# Patient Record
Sex: Male | Born: 1950
Health system: Southern US, Community
[De-identification: ages and names within clinical notes are randomized; demographics above are authoritative.]

## PROBLEM LIST (undated history)

## (undated) DIAGNOSIS — E119 Type 2 diabetes mellitus without complications: Secondary | ICD-10-CM

## (undated) DIAGNOSIS — E7800 Pure hypercholesterolemia, unspecified: Secondary | ICD-10-CM

## (undated) HISTORY — PX: CHOLECYSTECTOMY: SHX55

## (undated) HISTORY — PX: TONSILLECTOMY: SUR1361

## (undated) HISTORY — PX: EYE SURGERY: SHX253

---

## 1998-09-12 ENCOUNTER — Inpatient Hospital Stay (HOSPITAL_COMMUNITY): Admission: AD | Admit: 1998-09-12 | Discharge: 1998-09-13 | Payer: Self-pay | Admitting: Urology

## 1998-09-17 ENCOUNTER — Encounter (HOSPITAL_COMMUNITY): Admission: RE | Admit: 1998-09-17 | Discharge: 1998-10-15 | Payer: Self-pay | Admitting: Urology

## 1998-11-28 ENCOUNTER — Ambulatory Visit (HOSPITAL_COMMUNITY): Admission: RE | Admit: 1998-11-28 | Discharge: 1998-11-28 | Payer: Self-pay | Admitting: Family Medicine

## 2007-04-23 ENCOUNTER — Ambulatory Visit: Payer: Self-pay | Admitting: Family Medicine

## 2012-01-13 DIAGNOSIS — IMO0001 Reserved for inherently not codable concepts without codable children: Secondary | ICD-10-CM | POA: Diagnosis not present

## 2012-01-13 DIAGNOSIS — Z125 Encounter for screening for malignant neoplasm of prostate: Secondary | ICD-10-CM | POA: Diagnosis not present

## 2012-01-13 DIAGNOSIS — I10 Essential (primary) hypertension: Secondary | ICD-10-CM | POA: Diagnosis not present

## 2012-01-13 DIAGNOSIS — M25519 Pain in unspecified shoulder: Secondary | ICD-10-CM | POA: Diagnosis not present

## 2012-01-13 DIAGNOSIS — Z79899 Other long term (current) drug therapy: Secondary | ICD-10-CM | POA: Diagnosis not present

## 2012-01-13 DIAGNOSIS — E7800 Pure hypercholesterolemia, unspecified: Secondary | ICD-10-CM | POA: Diagnosis not present

## 2012-04-19 DIAGNOSIS — E782 Mixed hyperlipidemia: Secondary | ICD-10-CM | POA: Diagnosis not present

## 2012-04-19 DIAGNOSIS — M25519 Pain in unspecified shoulder: Secondary | ICD-10-CM | POA: Diagnosis not present

## 2012-04-19 DIAGNOSIS — E1149 Type 2 diabetes mellitus with other diabetic neurological complication: Secondary | ICD-10-CM | POA: Diagnosis not present

## 2012-04-19 DIAGNOSIS — I10 Essential (primary) hypertension: Secondary | ICD-10-CM | POA: Diagnosis not present

## 2012-07-21 DIAGNOSIS — I10 Essential (primary) hypertension: Secondary | ICD-10-CM | POA: Diagnosis not present

## 2012-07-21 DIAGNOSIS — F329 Major depressive disorder, single episode, unspecified: Secondary | ICD-10-CM | POA: Diagnosis not present

## 2012-07-21 DIAGNOSIS — E1149 Type 2 diabetes mellitus with other diabetic neurological complication: Secondary | ICD-10-CM | POA: Diagnosis not present

## 2012-07-21 DIAGNOSIS — E782 Mixed hyperlipidemia: Secondary | ICD-10-CM | POA: Diagnosis not present

## 2012-07-30 DIAGNOSIS — R7989 Other specified abnormal findings of blood chemistry: Secondary | ICD-10-CM | POA: Diagnosis not present

## 2012-07-30 DIAGNOSIS — K7689 Other specified diseases of liver: Secondary | ICD-10-CM | POA: Diagnosis not present

## 2012-10-21 DIAGNOSIS — E7800 Pure hypercholesterolemia, unspecified: Secondary | ICD-10-CM | POA: Diagnosis not present

## 2012-10-21 DIAGNOSIS — I10 Essential (primary) hypertension: Secondary | ICD-10-CM | POA: Diagnosis not present

## 2012-10-21 DIAGNOSIS — Z79899 Other long term (current) drug therapy: Secondary | ICD-10-CM | POA: Diagnosis not present

## 2012-10-21 DIAGNOSIS — E1149 Type 2 diabetes mellitus with other diabetic neurological complication: Secondary | ICD-10-CM | POA: Diagnosis not present

## 2013-01-21 DIAGNOSIS — E1149 Type 2 diabetes mellitus with other diabetic neurological complication: Secondary | ICD-10-CM | POA: Diagnosis not present

## 2013-01-21 DIAGNOSIS — Z125 Encounter for screening for malignant neoplasm of prostate: Secondary | ICD-10-CM | POA: Diagnosis not present

## 2013-01-21 DIAGNOSIS — M25519 Pain in unspecified shoulder: Secondary | ICD-10-CM | POA: Diagnosis not present

## 2013-01-21 DIAGNOSIS — I10 Essential (primary) hypertension: Secondary | ICD-10-CM | POA: Diagnosis not present

## 2013-01-21 DIAGNOSIS — E782 Mixed hyperlipidemia: Secondary | ICD-10-CM | POA: Diagnosis not present

## 2013-02-08 DIAGNOSIS — R972 Elevated prostate specific antigen [PSA]: Secondary | ICD-10-CM | POA: Diagnosis not present

## 2013-03-21 DIAGNOSIS — E1149 Type 2 diabetes mellitus with other diabetic neurological complication: Secondary | ICD-10-CM | POA: Diagnosis not present

## 2013-06-22 DIAGNOSIS — M25519 Pain in unspecified shoulder: Secondary | ICD-10-CM | POA: Diagnosis not present

## 2013-06-22 DIAGNOSIS — E7800 Pure hypercholesterolemia, unspecified: Secondary | ICD-10-CM | POA: Diagnosis not present

## 2013-06-22 DIAGNOSIS — I10 Essential (primary) hypertension: Secondary | ICD-10-CM | POA: Diagnosis not present

## 2013-06-22 DIAGNOSIS — E1149 Type 2 diabetes mellitus with other diabetic neurological complication: Secondary | ICD-10-CM | POA: Diagnosis not present

## 2013-07-29 DIAGNOSIS — E119 Type 2 diabetes mellitus without complications: Secondary | ICD-10-CM | POA: Diagnosis not present

## 2013-09-02 DIAGNOSIS — J019 Acute sinusitis, unspecified: Secondary | ICD-10-CM | POA: Diagnosis not present

## 2013-09-09 DIAGNOSIS — H11009 Unspecified pterygium of unspecified eye: Secondary | ICD-10-CM | POA: Diagnosis not present

## 2013-09-09 DIAGNOSIS — H2589 Other age-related cataract: Secondary | ICD-10-CM | POA: Diagnosis not present

## 2013-09-12 DIAGNOSIS — H11009 Unspecified pterygium of unspecified eye: Secondary | ICD-10-CM | POA: Diagnosis not present

## 2013-09-12 DIAGNOSIS — H11059 Peripheral pterygium, progressive, unspecified eye: Secondary | ICD-10-CM | POA: Diagnosis not present

## 2013-09-22 DIAGNOSIS — I10 Essential (primary) hypertension: Secondary | ICD-10-CM | POA: Diagnosis not present

## 2013-09-22 DIAGNOSIS — E1149 Type 2 diabetes mellitus with other diabetic neurological complication: Secondary | ICD-10-CM | POA: Diagnosis not present

## 2013-09-22 DIAGNOSIS — M25519 Pain in unspecified shoulder: Secondary | ICD-10-CM | POA: Diagnosis not present

## 2013-09-26 DIAGNOSIS — H11009 Unspecified pterygium of unspecified eye: Secondary | ICD-10-CM | POA: Diagnosis not present

## 2013-12-17 DIAGNOSIS — Z79899 Other long term (current) drug therapy: Secondary | ICD-10-CM | POA: Diagnosis not present

## 2013-12-17 DIAGNOSIS — I10 Essential (primary) hypertension: Secondary | ICD-10-CM | POA: Diagnosis not present

## 2013-12-17 DIAGNOSIS — L03818 Cellulitis of other sites: Secondary | ICD-10-CM | POA: Diagnosis not present

## 2013-12-17 DIAGNOSIS — E119 Type 2 diabetes mellitus without complications: Secondary | ICD-10-CM | POA: Diagnosis not present

## 2013-12-17 DIAGNOSIS — L02818 Cutaneous abscess of other sites: Secondary | ICD-10-CM | POA: Diagnosis not present

## 2013-12-17 DIAGNOSIS — E7800 Pure hypercholesterolemia, unspecified: Secondary | ICD-10-CM | POA: Diagnosis not present

## 2013-12-17 DIAGNOSIS — L0201 Cutaneous abscess of face: Secondary | ICD-10-CM | POA: Diagnosis not present

## 2013-12-29 DIAGNOSIS — L84 Corns and callosities: Secondary | ICD-10-CM | POA: Diagnosis not present

## 2013-12-29 DIAGNOSIS — F3289 Other specified depressive episodes: Secondary | ICD-10-CM | POA: Diagnosis not present

## 2013-12-29 DIAGNOSIS — M25519 Pain in unspecified shoulder: Secondary | ICD-10-CM | POA: Diagnosis not present

## 2013-12-29 DIAGNOSIS — E782 Mixed hyperlipidemia: Secondary | ICD-10-CM | POA: Diagnosis not present

## 2013-12-29 DIAGNOSIS — Z79899 Other long term (current) drug therapy: Secondary | ICD-10-CM | POA: Diagnosis not present

## 2013-12-29 DIAGNOSIS — I10 Essential (primary) hypertension: Secondary | ICD-10-CM | POA: Diagnosis not present

## 2013-12-29 DIAGNOSIS — F329 Major depressive disorder, single episode, unspecified: Secondary | ICD-10-CM | POA: Diagnosis not present

## 2013-12-29 DIAGNOSIS — E1149 Type 2 diabetes mellitus with other diabetic neurological complication: Secondary | ICD-10-CM | POA: Diagnosis not present

## 2014-01-03 DIAGNOSIS — H2589 Other age-related cataract: Secondary | ICD-10-CM | POA: Diagnosis not present

## 2014-01-24 DIAGNOSIS — Z794 Long term (current) use of insulin: Secondary | ICD-10-CM | POA: Diagnosis not present

## 2014-01-24 DIAGNOSIS — H2589 Other age-related cataract: Secondary | ICD-10-CM | POA: Diagnosis not present

## 2014-01-24 DIAGNOSIS — E119 Type 2 diabetes mellitus without complications: Secondary | ICD-10-CM | POA: Diagnosis not present

## 2014-01-24 DIAGNOSIS — Z79899 Other long term (current) drug therapy: Secondary | ICD-10-CM | POA: Diagnosis not present

## 2014-01-24 DIAGNOSIS — E785 Hyperlipidemia, unspecified: Secondary | ICD-10-CM | POA: Diagnosis not present

## 2014-01-24 DIAGNOSIS — I2510 Atherosclerotic heart disease of native coronary artery without angina pectoris: Secondary | ICD-10-CM | POA: Diagnosis not present

## 2014-01-24 DIAGNOSIS — I10 Essential (primary) hypertension: Secondary | ICD-10-CM | POA: Diagnosis not present

## 2014-01-24 DIAGNOSIS — H259 Unspecified age-related cataract: Secondary | ICD-10-CM | POA: Diagnosis not present

## 2014-02-27 DIAGNOSIS — E1149 Type 2 diabetes mellitus with other diabetic neurological complication: Secondary | ICD-10-CM | POA: Diagnosis not present

## 2014-02-27 DIAGNOSIS — G609 Hereditary and idiopathic neuropathy, unspecified: Secondary | ICD-10-CM | POA: Diagnosis not present

## 2014-02-27 DIAGNOSIS — R5383 Other fatigue: Secondary | ICD-10-CM | POA: Diagnosis not present

## 2014-02-27 DIAGNOSIS — R5381 Other malaise: Secondary | ICD-10-CM | POA: Diagnosis not present

## 2014-05-01 DIAGNOSIS — E1149 Type 2 diabetes mellitus with other diabetic neurological complication: Secondary | ICD-10-CM | POA: Diagnosis not present

## 2014-05-01 DIAGNOSIS — N289 Disorder of kidney and ureter, unspecified: Secondary | ICD-10-CM | POA: Diagnosis not present

## 2014-05-01 DIAGNOSIS — L84 Corns and callosities: Secondary | ICD-10-CM | POA: Diagnosis not present

## 2014-07-03 DIAGNOSIS — E1149 Type 2 diabetes mellitus with other diabetic neurological complication: Secondary | ICD-10-CM | POA: Diagnosis not present

## 2014-07-03 DIAGNOSIS — L02219 Cutaneous abscess of trunk, unspecified: Secondary | ICD-10-CM | POA: Diagnosis not present

## 2014-07-03 DIAGNOSIS — L03319 Cellulitis of trunk, unspecified: Secondary | ICD-10-CM | POA: Diagnosis not present

## 2014-07-03 DIAGNOSIS — M25519 Pain in unspecified shoulder: Secondary | ICD-10-CM | POA: Diagnosis not present

## 2014-07-16 DIAGNOSIS — L02419 Cutaneous abscess of limb, unspecified: Secondary | ICD-10-CM | POA: Diagnosis not present

## 2014-07-16 DIAGNOSIS — K648 Other hemorrhoids: Secondary | ICD-10-CM | POA: Diagnosis present

## 2014-07-16 DIAGNOSIS — A419 Sepsis, unspecified organism: Secondary | ICD-10-CM | POA: Diagnosis not present

## 2014-07-16 DIAGNOSIS — R0902 Hypoxemia: Secondary | ICD-10-CM | POA: Diagnosis present

## 2014-07-16 DIAGNOSIS — R109 Unspecified abdominal pain: Secondary | ICD-10-CM | POA: Diagnosis not present

## 2014-07-16 DIAGNOSIS — IMO0001 Reserved for inherently not codable concepts without codable children: Secondary | ICD-10-CM | POA: Diagnosis not present

## 2014-07-16 DIAGNOSIS — E1149 Type 2 diabetes mellitus with other diabetic neurological complication: Secondary | ICD-10-CM | POA: Diagnosis present

## 2014-07-16 DIAGNOSIS — N189 Chronic kidney disease, unspecified: Secondary | ICD-10-CM | POA: Diagnosis not present

## 2014-07-16 DIAGNOSIS — L03119 Cellulitis of unspecified part of limb: Secondary | ICD-10-CM | POA: Diagnosis not present

## 2014-07-16 DIAGNOSIS — N508 Other specified disorders of male genital organs: Secondary | ICD-10-CM | POA: Diagnosis not present

## 2014-07-16 DIAGNOSIS — L0291 Cutaneous abscess, unspecified: Secondary | ICD-10-CM | POA: Diagnosis not present

## 2014-07-16 DIAGNOSIS — Z794 Long term (current) use of insulin: Secondary | ICD-10-CM | POA: Diagnosis not present

## 2014-07-16 DIAGNOSIS — K5669 Other intestinal obstruction: Secondary | ICD-10-CM | POA: Diagnosis not present

## 2014-07-16 DIAGNOSIS — D473 Essential (hemorrhagic) thrombocythemia: Secondary | ICD-10-CM | POA: Diagnosis present

## 2014-07-16 DIAGNOSIS — R9431 Abnormal electrocardiogram [ECG] [EKG]: Secondary | ICD-10-CM | POA: Diagnosis not present

## 2014-07-16 DIAGNOSIS — R3915 Urgency of urination: Secondary | ICD-10-CM | POA: Diagnosis present

## 2014-07-16 DIAGNOSIS — L988 Other specified disorders of the skin and subcutaneous tissue: Secondary | ICD-10-CM | POA: Diagnosis not present

## 2014-07-16 DIAGNOSIS — E871 Hypo-osmolality and hyponatremia: Secondary | ICD-10-CM | POA: Diagnosis not present

## 2014-07-16 DIAGNOSIS — K645 Perianal venous thrombosis: Secondary | ICD-10-CM | POA: Diagnosis present

## 2014-07-16 DIAGNOSIS — N5089 Other specified disorders of the male genital organs: Secondary | ICD-10-CM | POA: Diagnosis present

## 2014-07-16 DIAGNOSIS — I2510 Atherosclerotic heart disease of native coronary artery without angina pectoris: Secondary | ICD-10-CM | POA: Diagnosis present

## 2014-07-16 DIAGNOSIS — I10 Essential (primary) hypertension: Secondary | ICD-10-CM | POA: Diagnosis not present

## 2014-07-16 DIAGNOSIS — K644 Residual hemorrhoidal skin tags: Secondary | ICD-10-CM | POA: Diagnosis present

## 2014-07-16 DIAGNOSIS — N401 Enlarged prostate with lower urinary tract symptoms: Secondary | ICD-10-CM | POA: Diagnosis present

## 2014-07-16 DIAGNOSIS — Z79899 Other long term (current) drug therapy: Secondary | ICD-10-CM | POA: Diagnosis not present

## 2014-07-16 DIAGNOSIS — D649 Anemia, unspecified: Secondary | ICD-10-CM | POA: Diagnosis not present

## 2014-07-16 DIAGNOSIS — E1142 Type 2 diabetes mellitus with diabetic polyneuropathy: Secondary | ICD-10-CM | POA: Diagnosis present

## 2014-07-16 DIAGNOSIS — B961 Klebsiella pneumoniae [K. pneumoniae] as the cause of diseases classified elsewhere: Secondary | ICD-10-CM | POA: Diagnosis present

## 2014-07-16 DIAGNOSIS — L02219 Cutaneous abscess of trunk, unspecified: Secondary | ICD-10-CM | POA: Diagnosis not present

## 2014-07-16 DIAGNOSIS — E119 Type 2 diabetes mellitus without complications: Secondary | ICD-10-CM | POA: Diagnosis not present

## 2014-07-16 DIAGNOSIS — E7800 Pure hypercholesterolemia, unspecified: Secondary | ICD-10-CM | POA: Diagnosis present

## 2014-07-16 DIAGNOSIS — I129 Hypertensive chronic kidney disease with stage 1 through stage 4 chronic kidney disease, or unspecified chronic kidney disease: Secondary | ICD-10-CM | POA: Diagnosis present

## 2014-07-16 DIAGNOSIS — R0609 Other forms of dyspnea: Secondary | ICD-10-CM | POA: Diagnosis not present

## 2014-07-16 DIAGNOSIS — E875 Hyperkalemia: Secondary | ICD-10-CM | POA: Diagnosis present

## 2014-07-16 DIAGNOSIS — R6510 Systemic inflammatory response syndrome (SIRS) of non-infectious origin without acute organ dysfunction: Secondary | ICD-10-CM | POA: Diagnosis not present

## 2014-07-21 DIAGNOSIS — L988 Other specified disorders of the skin and subcutaneous tissue: Secondary | ICD-10-CM | POA: Diagnosis not present

## 2014-07-21 DIAGNOSIS — L02219 Cutaneous abscess of trunk, unspecified: Secondary | ICD-10-CM | POA: Diagnosis not present

## 2014-07-21 DIAGNOSIS — N508 Other specified disorders of male genital organs: Secondary | ICD-10-CM | POA: Diagnosis not present

## 2014-07-21 DIAGNOSIS — L03319 Cellulitis of trunk, unspecified: Secondary | ICD-10-CM | POA: Diagnosis not present

## 2014-07-26 DIAGNOSIS — L03319 Cellulitis of trunk, unspecified: Secondary | ICD-10-CM | POA: Diagnosis not present

## 2014-07-26 DIAGNOSIS — E1165 Type 2 diabetes mellitus with hyperglycemia: Secondary | ICD-10-CM | POA: Diagnosis not present

## 2014-07-26 DIAGNOSIS — E780 Pure hypercholesterolemia: Secondary | ICD-10-CM | POA: Diagnosis not present

## 2014-07-26 DIAGNOSIS — I10 Essential (primary) hypertension: Secondary | ICD-10-CM | POA: Diagnosis not present

## 2014-07-26 DIAGNOSIS — Z794 Long term (current) use of insulin: Secondary | ICD-10-CM | POA: Diagnosis not present

## 2014-07-27 DIAGNOSIS — E1165 Type 2 diabetes mellitus with hyperglycemia: Secondary | ICD-10-CM | POA: Diagnosis not present

## 2014-07-27 DIAGNOSIS — Z794 Long term (current) use of insulin: Secondary | ICD-10-CM | POA: Diagnosis not present

## 2014-07-27 DIAGNOSIS — E780 Pure hypercholesterolemia: Secondary | ICD-10-CM | POA: Diagnosis not present

## 2014-07-27 DIAGNOSIS — I10 Essential (primary) hypertension: Secondary | ICD-10-CM | POA: Diagnosis not present

## 2014-07-27 DIAGNOSIS — L03319 Cellulitis of trunk, unspecified: Secondary | ICD-10-CM | POA: Diagnosis not present

## 2014-07-28 DIAGNOSIS — Z794 Long term (current) use of insulin: Secondary | ICD-10-CM | POA: Diagnosis not present

## 2014-07-28 DIAGNOSIS — E780 Pure hypercholesterolemia: Secondary | ICD-10-CM | POA: Diagnosis not present

## 2014-07-28 DIAGNOSIS — I10 Essential (primary) hypertension: Secondary | ICD-10-CM | POA: Diagnosis not present

## 2014-07-28 DIAGNOSIS — E1165 Type 2 diabetes mellitus with hyperglycemia: Secondary | ICD-10-CM | POA: Diagnosis not present

## 2014-07-28 DIAGNOSIS — L03319 Cellulitis of trunk, unspecified: Secondary | ICD-10-CM | POA: Diagnosis not present

## 2014-07-29 DIAGNOSIS — Z794 Long term (current) use of insulin: Secondary | ICD-10-CM | POA: Diagnosis not present

## 2014-07-29 DIAGNOSIS — E780 Pure hypercholesterolemia: Secondary | ICD-10-CM | POA: Diagnosis not present

## 2014-07-29 DIAGNOSIS — L03319 Cellulitis of trunk, unspecified: Secondary | ICD-10-CM | POA: Diagnosis not present

## 2014-07-29 DIAGNOSIS — I10 Essential (primary) hypertension: Secondary | ICD-10-CM | POA: Diagnosis not present

## 2014-07-29 DIAGNOSIS — E1165 Type 2 diabetes mellitus with hyperglycemia: Secondary | ICD-10-CM | POA: Diagnosis not present

## 2014-07-30 DIAGNOSIS — Z794 Long term (current) use of insulin: Secondary | ICD-10-CM | POA: Diagnosis not present

## 2014-07-30 DIAGNOSIS — L03319 Cellulitis of trunk, unspecified: Secondary | ICD-10-CM | POA: Diagnosis not present

## 2014-07-30 DIAGNOSIS — E780 Pure hypercholesterolemia: Secondary | ICD-10-CM | POA: Diagnosis not present

## 2014-07-30 DIAGNOSIS — E1165 Type 2 diabetes mellitus with hyperglycemia: Secondary | ICD-10-CM | POA: Diagnosis not present

## 2014-07-30 DIAGNOSIS — I10 Essential (primary) hypertension: Secondary | ICD-10-CM | POA: Diagnosis not present

## 2014-07-31 DIAGNOSIS — I10 Essential (primary) hypertension: Secondary | ICD-10-CM | POA: Diagnosis not present

## 2014-07-31 DIAGNOSIS — Z794 Long term (current) use of insulin: Secondary | ICD-10-CM | POA: Diagnosis not present

## 2014-07-31 DIAGNOSIS — E780 Pure hypercholesterolemia: Secondary | ICD-10-CM | POA: Diagnosis not present

## 2014-07-31 DIAGNOSIS — L03319 Cellulitis of trunk, unspecified: Secondary | ICD-10-CM | POA: Diagnosis not present

## 2014-07-31 DIAGNOSIS — E1165 Type 2 diabetes mellitus with hyperglycemia: Secondary | ICD-10-CM | POA: Diagnosis not present

## 2014-08-01 DIAGNOSIS — I10 Essential (primary) hypertension: Secondary | ICD-10-CM | POA: Diagnosis not present

## 2014-08-01 DIAGNOSIS — L03319 Cellulitis of trunk, unspecified: Secondary | ICD-10-CM | POA: Diagnosis not present

## 2014-08-01 DIAGNOSIS — E1165 Type 2 diabetes mellitus with hyperglycemia: Secondary | ICD-10-CM | POA: Diagnosis not present

## 2014-08-01 DIAGNOSIS — E780 Pure hypercholesterolemia: Secondary | ICD-10-CM | POA: Diagnosis not present

## 2014-08-01 DIAGNOSIS — Z794 Long term (current) use of insulin: Secondary | ICD-10-CM | POA: Diagnosis not present

## 2014-08-02 DIAGNOSIS — L0390 Cellulitis, unspecified: Secondary | ICD-10-CM | POA: Diagnosis not present

## 2014-08-02 DIAGNOSIS — E780 Pure hypercholesterolemia: Secondary | ICD-10-CM | POA: Diagnosis not present

## 2014-08-02 DIAGNOSIS — L0291 Cutaneous abscess, unspecified: Secondary | ICD-10-CM | POA: Diagnosis not present

## 2014-08-02 DIAGNOSIS — I10 Essential (primary) hypertension: Secondary | ICD-10-CM | POA: Diagnosis not present

## 2014-08-02 DIAGNOSIS — L03319 Cellulitis of trunk, unspecified: Secondary | ICD-10-CM | POA: Diagnosis not present

## 2014-08-02 DIAGNOSIS — Z794 Long term (current) use of insulin: Secondary | ICD-10-CM | POA: Diagnosis not present

## 2014-08-02 DIAGNOSIS — E1165 Type 2 diabetes mellitus with hyperglycemia: Secondary | ICD-10-CM | POA: Diagnosis not present

## 2014-08-03 DIAGNOSIS — E1165 Type 2 diabetes mellitus with hyperglycemia: Secondary | ICD-10-CM | POA: Diagnosis not present

## 2014-08-03 DIAGNOSIS — I10 Essential (primary) hypertension: Secondary | ICD-10-CM | POA: Diagnosis not present

## 2014-08-03 DIAGNOSIS — L03319 Cellulitis of trunk, unspecified: Secondary | ICD-10-CM | POA: Diagnosis not present

## 2014-08-03 DIAGNOSIS — E780 Pure hypercholesterolemia: Secondary | ICD-10-CM | POA: Diagnosis not present

## 2014-08-03 DIAGNOSIS — Z794 Long term (current) use of insulin: Secondary | ICD-10-CM | POA: Diagnosis not present

## 2014-08-04 DIAGNOSIS — Z794 Long term (current) use of insulin: Secondary | ICD-10-CM | POA: Diagnosis not present

## 2014-08-04 DIAGNOSIS — L03319 Cellulitis of trunk, unspecified: Secondary | ICD-10-CM | POA: Diagnosis not present

## 2014-08-04 DIAGNOSIS — E1165 Type 2 diabetes mellitus with hyperglycemia: Secondary | ICD-10-CM | POA: Diagnosis not present

## 2014-08-04 DIAGNOSIS — E780 Pure hypercholesterolemia: Secondary | ICD-10-CM | POA: Diagnosis not present

## 2014-08-04 DIAGNOSIS — I10 Essential (primary) hypertension: Secondary | ICD-10-CM | POA: Diagnosis not present

## 2014-08-05 DIAGNOSIS — Z794 Long term (current) use of insulin: Secondary | ICD-10-CM | POA: Diagnosis not present

## 2014-08-05 DIAGNOSIS — E780 Pure hypercholesterolemia: Secondary | ICD-10-CM | POA: Diagnosis not present

## 2014-08-05 DIAGNOSIS — I10 Essential (primary) hypertension: Secondary | ICD-10-CM | POA: Diagnosis not present

## 2014-08-05 DIAGNOSIS — E1165 Type 2 diabetes mellitus with hyperglycemia: Secondary | ICD-10-CM | POA: Diagnosis not present

## 2014-08-05 DIAGNOSIS — L03319 Cellulitis of trunk, unspecified: Secondary | ICD-10-CM | POA: Diagnosis not present

## 2014-08-06 DIAGNOSIS — E780 Pure hypercholesterolemia: Secondary | ICD-10-CM | POA: Diagnosis not present

## 2014-08-06 DIAGNOSIS — E1165 Type 2 diabetes mellitus with hyperglycemia: Secondary | ICD-10-CM | POA: Diagnosis not present

## 2014-08-06 DIAGNOSIS — Z794 Long term (current) use of insulin: Secondary | ICD-10-CM | POA: Diagnosis not present

## 2014-08-06 DIAGNOSIS — I10 Essential (primary) hypertension: Secondary | ICD-10-CM | POA: Diagnosis not present

## 2014-08-06 DIAGNOSIS — L03319 Cellulitis of trunk, unspecified: Secondary | ICD-10-CM | POA: Diagnosis not present

## 2014-08-07 DIAGNOSIS — L03319 Cellulitis of trunk, unspecified: Secondary | ICD-10-CM | POA: Diagnosis not present

## 2014-08-07 DIAGNOSIS — E780 Pure hypercholesterolemia: Secondary | ICD-10-CM | POA: Diagnosis not present

## 2014-08-07 DIAGNOSIS — E1165 Type 2 diabetes mellitus with hyperglycemia: Secondary | ICD-10-CM | POA: Diagnosis not present

## 2014-08-07 DIAGNOSIS — Z794 Long term (current) use of insulin: Secondary | ICD-10-CM | POA: Diagnosis not present

## 2014-08-07 DIAGNOSIS — I10 Essential (primary) hypertension: Secondary | ICD-10-CM | POA: Diagnosis not present

## 2014-08-08 DIAGNOSIS — I10 Essential (primary) hypertension: Secondary | ICD-10-CM | POA: Diagnosis not present

## 2014-08-08 DIAGNOSIS — E780 Pure hypercholesterolemia: Secondary | ICD-10-CM | POA: Diagnosis not present

## 2014-08-08 DIAGNOSIS — Z794 Long term (current) use of insulin: Secondary | ICD-10-CM | POA: Diagnosis not present

## 2014-08-08 DIAGNOSIS — E1165 Type 2 diabetes mellitus with hyperglycemia: Secondary | ICD-10-CM | POA: Diagnosis not present

## 2014-08-08 DIAGNOSIS — L03319 Cellulitis of trunk, unspecified: Secondary | ICD-10-CM | POA: Diagnosis not present

## 2014-08-09 DIAGNOSIS — Z794 Long term (current) use of insulin: Secondary | ICD-10-CM | POA: Diagnosis not present

## 2014-08-09 DIAGNOSIS — E1165 Type 2 diabetes mellitus with hyperglycemia: Secondary | ICD-10-CM | POA: Diagnosis not present

## 2014-08-09 DIAGNOSIS — E780 Pure hypercholesterolemia: Secondary | ICD-10-CM | POA: Diagnosis not present

## 2014-08-09 DIAGNOSIS — I10 Essential (primary) hypertension: Secondary | ICD-10-CM | POA: Diagnosis not present

## 2014-08-09 DIAGNOSIS — L03319 Cellulitis of trunk, unspecified: Secondary | ICD-10-CM | POA: Diagnosis not present

## 2014-08-10 DIAGNOSIS — E780 Pure hypercholesterolemia: Secondary | ICD-10-CM | POA: Diagnosis not present

## 2014-08-10 DIAGNOSIS — Z794 Long term (current) use of insulin: Secondary | ICD-10-CM | POA: Diagnosis not present

## 2014-08-10 DIAGNOSIS — L03319 Cellulitis of trunk, unspecified: Secondary | ICD-10-CM | POA: Diagnosis not present

## 2014-08-10 DIAGNOSIS — E1165 Type 2 diabetes mellitus with hyperglycemia: Secondary | ICD-10-CM | POA: Diagnosis not present

## 2014-08-10 DIAGNOSIS — I10 Essential (primary) hypertension: Secondary | ICD-10-CM | POA: Diagnosis not present

## 2014-08-11 DIAGNOSIS — E1165 Type 2 diabetes mellitus with hyperglycemia: Secondary | ICD-10-CM | POA: Diagnosis not present

## 2014-08-11 DIAGNOSIS — Z794 Long term (current) use of insulin: Secondary | ICD-10-CM | POA: Diagnosis not present

## 2014-08-11 DIAGNOSIS — L03319 Cellulitis of trunk, unspecified: Secondary | ICD-10-CM | POA: Diagnosis not present

## 2014-08-11 DIAGNOSIS — E780 Pure hypercholesterolemia: Secondary | ICD-10-CM | POA: Diagnosis not present

## 2014-08-11 DIAGNOSIS — I10 Essential (primary) hypertension: Secondary | ICD-10-CM | POA: Diagnosis not present

## 2014-08-12 DIAGNOSIS — Z794 Long term (current) use of insulin: Secondary | ICD-10-CM | POA: Diagnosis not present

## 2014-08-12 DIAGNOSIS — I10 Essential (primary) hypertension: Secondary | ICD-10-CM | POA: Diagnosis not present

## 2014-08-12 DIAGNOSIS — E780 Pure hypercholesterolemia: Secondary | ICD-10-CM | POA: Diagnosis not present

## 2014-08-12 DIAGNOSIS — E1165 Type 2 diabetes mellitus with hyperglycemia: Secondary | ICD-10-CM | POA: Diagnosis not present

## 2014-08-12 DIAGNOSIS — L03319 Cellulitis of trunk, unspecified: Secondary | ICD-10-CM | POA: Diagnosis not present

## 2014-08-13 DIAGNOSIS — Z794 Long term (current) use of insulin: Secondary | ICD-10-CM | POA: Diagnosis not present

## 2014-08-13 DIAGNOSIS — L03319 Cellulitis of trunk, unspecified: Secondary | ICD-10-CM | POA: Diagnosis not present

## 2014-08-13 DIAGNOSIS — E1165 Type 2 diabetes mellitus with hyperglycemia: Secondary | ICD-10-CM | POA: Diagnosis not present

## 2014-08-13 DIAGNOSIS — E780 Pure hypercholesterolemia: Secondary | ICD-10-CM | POA: Diagnosis not present

## 2014-08-13 DIAGNOSIS — I10 Essential (primary) hypertension: Secondary | ICD-10-CM | POA: Diagnosis not present

## 2014-08-14 DIAGNOSIS — Z794 Long term (current) use of insulin: Secondary | ICD-10-CM | POA: Diagnosis not present

## 2014-08-14 DIAGNOSIS — L03319 Cellulitis of trunk, unspecified: Secondary | ICD-10-CM | POA: Diagnosis not present

## 2014-08-14 DIAGNOSIS — E1165 Type 2 diabetes mellitus with hyperglycemia: Secondary | ICD-10-CM | POA: Diagnosis not present

## 2014-08-14 DIAGNOSIS — I10 Essential (primary) hypertension: Secondary | ICD-10-CM | POA: Diagnosis not present

## 2014-08-14 DIAGNOSIS — E780 Pure hypercholesterolemia: Secondary | ICD-10-CM | POA: Diagnosis not present

## 2014-08-15 DIAGNOSIS — L03319 Cellulitis of trunk, unspecified: Secondary | ICD-10-CM | POA: Diagnosis not present

## 2014-08-15 DIAGNOSIS — I10 Essential (primary) hypertension: Secondary | ICD-10-CM | POA: Diagnosis not present

## 2014-08-15 DIAGNOSIS — E780 Pure hypercholesterolemia: Secondary | ICD-10-CM | POA: Diagnosis not present

## 2014-08-15 DIAGNOSIS — E1165 Type 2 diabetes mellitus with hyperglycemia: Secondary | ICD-10-CM | POA: Diagnosis not present

## 2014-08-15 DIAGNOSIS — Z794 Long term (current) use of insulin: Secondary | ICD-10-CM | POA: Diagnosis not present

## 2014-08-16 DIAGNOSIS — L03319 Cellulitis of trunk, unspecified: Secondary | ICD-10-CM | POA: Diagnosis not present

## 2014-08-16 DIAGNOSIS — I10 Essential (primary) hypertension: Secondary | ICD-10-CM | POA: Diagnosis not present

## 2014-08-16 DIAGNOSIS — E1165 Type 2 diabetes mellitus with hyperglycemia: Secondary | ICD-10-CM | POA: Diagnosis not present

## 2014-08-16 DIAGNOSIS — E780 Pure hypercholesterolemia: Secondary | ICD-10-CM | POA: Diagnosis not present

## 2014-08-16 DIAGNOSIS — Z794 Long term (current) use of insulin: Secondary | ICD-10-CM | POA: Diagnosis not present

## 2014-08-17 DIAGNOSIS — L03319 Cellulitis of trunk, unspecified: Secondary | ICD-10-CM | POA: Diagnosis not present

## 2014-08-17 DIAGNOSIS — I10 Essential (primary) hypertension: Secondary | ICD-10-CM | POA: Diagnosis not present

## 2014-08-17 DIAGNOSIS — E780 Pure hypercholesterolemia: Secondary | ICD-10-CM | POA: Diagnosis not present

## 2014-08-17 DIAGNOSIS — Z794 Long term (current) use of insulin: Secondary | ICD-10-CM | POA: Diagnosis not present

## 2014-08-17 DIAGNOSIS — K8010 Calculus of gallbladder with chronic cholecystitis without obstruction: Secondary | ICD-10-CM | POA: Diagnosis not present

## 2014-08-17 DIAGNOSIS — E1165 Type 2 diabetes mellitus with hyperglycemia: Secondary | ICD-10-CM | POA: Diagnosis not present

## 2014-08-18 DIAGNOSIS — E780 Pure hypercholesterolemia: Secondary | ICD-10-CM | POA: Diagnosis not present

## 2014-08-18 DIAGNOSIS — L03319 Cellulitis of trunk, unspecified: Secondary | ICD-10-CM | POA: Diagnosis not present

## 2014-08-18 DIAGNOSIS — E1165 Type 2 diabetes mellitus with hyperglycemia: Secondary | ICD-10-CM | POA: Diagnosis not present

## 2014-08-18 DIAGNOSIS — I10 Essential (primary) hypertension: Secondary | ICD-10-CM | POA: Diagnosis not present

## 2014-08-18 DIAGNOSIS — Z794 Long term (current) use of insulin: Secondary | ICD-10-CM | POA: Diagnosis not present

## 2014-08-19 DIAGNOSIS — I10 Essential (primary) hypertension: Secondary | ICD-10-CM | POA: Diagnosis not present

## 2014-08-19 DIAGNOSIS — Z794 Long term (current) use of insulin: Secondary | ICD-10-CM | POA: Diagnosis not present

## 2014-08-19 DIAGNOSIS — E780 Pure hypercholesterolemia: Secondary | ICD-10-CM | POA: Diagnosis not present

## 2014-08-19 DIAGNOSIS — E1165 Type 2 diabetes mellitus with hyperglycemia: Secondary | ICD-10-CM | POA: Diagnosis not present

## 2014-08-19 DIAGNOSIS — L03319 Cellulitis of trunk, unspecified: Secondary | ICD-10-CM | POA: Diagnosis not present

## 2014-08-20 DIAGNOSIS — L03319 Cellulitis of trunk, unspecified: Secondary | ICD-10-CM | POA: Diagnosis not present

## 2014-08-20 DIAGNOSIS — Z794 Long term (current) use of insulin: Secondary | ICD-10-CM | POA: Diagnosis not present

## 2014-08-20 DIAGNOSIS — E780 Pure hypercholesterolemia: Secondary | ICD-10-CM | POA: Diagnosis not present

## 2014-08-20 DIAGNOSIS — E1165 Type 2 diabetes mellitus with hyperglycemia: Secondary | ICD-10-CM | POA: Diagnosis not present

## 2014-08-20 DIAGNOSIS — I10 Essential (primary) hypertension: Secondary | ICD-10-CM | POA: Diagnosis not present

## 2014-08-21 DIAGNOSIS — I10 Essential (primary) hypertension: Secondary | ICD-10-CM | POA: Diagnosis not present

## 2014-08-21 DIAGNOSIS — Z794 Long term (current) use of insulin: Secondary | ICD-10-CM | POA: Diagnosis not present

## 2014-08-21 DIAGNOSIS — E780 Pure hypercholesterolemia: Secondary | ICD-10-CM | POA: Diagnosis not present

## 2014-08-21 DIAGNOSIS — E1165 Type 2 diabetes mellitus with hyperglycemia: Secondary | ICD-10-CM | POA: Diagnosis not present

## 2014-08-21 DIAGNOSIS — L03319 Cellulitis of trunk, unspecified: Secondary | ICD-10-CM | POA: Diagnosis not present

## 2014-08-22 DIAGNOSIS — Z794 Long term (current) use of insulin: Secondary | ICD-10-CM | POA: Diagnosis not present

## 2014-08-22 DIAGNOSIS — L03319 Cellulitis of trunk, unspecified: Secondary | ICD-10-CM | POA: Diagnosis not present

## 2014-08-22 DIAGNOSIS — E1165 Type 2 diabetes mellitus with hyperglycemia: Secondary | ICD-10-CM | POA: Diagnosis not present

## 2014-08-22 DIAGNOSIS — I10 Essential (primary) hypertension: Secondary | ICD-10-CM | POA: Diagnosis not present

## 2014-08-22 DIAGNOSIS — E780 Pure hypercholesterolemia: Secondary | ICD-10-CM | POA: Diagnosis not present

## 2014-08-23 DIAGNOSIS — L0390 Cellulitis, unspecified: Secondary | ICD-10-CM | POA: Diagnosis not present

## 2014-08-23 DIAGNOSIS — E780 Pure hypercholesterolemia: Secondary | ICD-10-CM | POA: Diagnosis not present

## 2014-08-23 DIAGNOSIS — E1165 Type 2 diabetes mellitus with hyperglycemia: Secondary | ICD-10-CM | POA: Diagnosis not present

## 2014-08-23 DIAGNOSIS — L03319 Cellulitis of trunk, unspecified: Secondary | ICD-10-CM | POA: Diagnosis not present

## 2014-08-23 DIAGNOSIS — E119 Type 2 diabetes mellitus without complications: Secondary | ICD-10-CM | POA: Diagnosis not present

## 2014-08-23 DIAGNOSIS — I10 Essential (primary) hypertension: Secondary | ICD-10-CM | POA: Diagnosis not present

## 2014-08-23 DIAGNOSIS — L0291 Cutaneous abscess, unspecified: Secondary | ICD-10-CM | POA: Diagnosis not present

## 2014-08-23 DIAGNOSIS — Z794 Long term (current) use of insulin: Secondary | ICD-10-CM | POA: Diagnosis not present

## 2014-08-24 DIAGNOSIS — Z794 Long term (current) use of insulin: Secondary | ICD-10-CM | POA: Diagnosis not present

## 2014-08-24 DIAGNOSIS — L03319 Cellulitis of trunk, unspecified: Secondary | ICD-10-CM | POA: Diagnosis not present

## 2014-08-24 DIAGNOSIS — I10 Essential (primary) hypertension: Secondary | ICD-10-CM | POA: Diagnosis not present

## 2014-08-24 DIAGNOSIS — L0291 Cutaneous abscess, unspecified: Secondary | ICD-10-CM | POA: Diagnosis not present

## 2014-08-24 DIAGNOSIS — E1165 Type 2 diabetes mellitus with hyperglycemia: Secondary | ICD-10-CM | POA: Diagnosis not present

## 2014-08-24 DIAGNOSIS — E780 Pure hypercholesterolemia: Secondary | ICD-10-CM | POA: Diagnosis not present

## 2014-08-24 DIAGNOSIS — L0390 Cellulitis, unspecified: Secondary | ICD-10-CM | POA: Diagnosis not present

## 2014-08-25 DIAGNOSIS — E780 Pure hypercholesterolemia: Secondary | ICD-10-CM | POA: Diagnosis not present

## 2014-08-25 DIAGNOSIS — L03319 Cellulitis of trunk, unspecified: Secondary | ICD-10-CM | POA: Diagnosis not present

## 2014-08-25 DIAGNOSIS — E1165 Type 2 diabetes mellitus with hyperglycemia: Secondary | ICD-10-CM | POA: Diagnosis not present

## 2014-08-25 DIAGNOSIS — Z794 Long term (current) use of insulin: Secondary | ICD-10-CM | POA: Diagnosis not present

## 2014-08-25 DIAGNOSIS — I10 Essential (primary) hypertension: Secondary | ICD-10-CM | POA: Diagnosis not present

## 2014-08-26 DIAGNOSIS — E780 Pure hypercholesterolemia: Secondary | ICD-10-CM | POA: Diagnosis not present

## 2014-08-26 DIAGNOSIS — L03319 Cellulitis of trunk, unspecified: Secondary | ICD-10-CM | POA: Diagnosis not present

## 2014-08-26 DIAGNOSIS — Z794 Long term (current) use of insulin: Secondary | ICD-10-CM | POA: Diagnosis not present

## 2014-08-26 DIAGNOSIS — E1165 Type 2 diabetes mellitus with hyperglycemia: Secondary | ICD-10-CM | POA: Diagnosis not present

## 2014-08-26 DIAGNOSIS — I10 Essential (primary) hypertension: Secondary | ICD-10-CM | POA: Diagnosis not present

## 2014-08-27 DIAGNOSIS — E780 Pure hypercholesterolemia: Secondary | ICD-10-CM | POA: Diagnosis not present

## 2014-08-27 DIAGNOSIS — L03319 Cellulitis of trunk, unspecified: Secondary | ICD-10-CM | POA: Diagnosis not present

## 2014-08-27 DIAGNOSIS — I10 Essential (primary) hypertension: Secondary | ICD-10-CM | POA: Diagnosis not present

## 2014-08-27 DIAGNOSIS — Z794 Long term (current) use of insulin: Secondary | ICD-10-CM | POA: Diagnosis not present

## 2014-08-27 DIAGNOSIS — E1165 Type 2 diabetes mellitus with hyperglycemia: Secondary | ICD-10-CM | POA: Diagnosis not present

## 2014-08-28 DIAGNOSIS — L0390 Cellulitis, unspecified: Secondary | ICD-10-CM | POA: Diagnosis not present

## 2014-08-28 DIAGNOSIS — L03319 Cellulitis of trunk, unspecified: Secondary | ICD-10-CM | POA: Diagnosis not present

## 2014-08-28 DIAGNOSIS — E1165 Type 2 diabetes mellitus with hyperglycemia: Secondary | ICD-10-CM | POA: Diagnosis not present

## 2014-08-28 DIAGNOSIS — L0291 Cutaneous abscess, unspecified: Secondary | ICD-10-CM | POA: Diagnosis not present

## 2014-08-28 DIAGNOSIS — E780 Pure hypercholesterolemia: Secondary | ICD-10-CM | POA: Diagnosis not present

## 2014-08-28 DIAGNOSIS — Z794 Long term (current) use of insulin: Secondary | ICD-10-CM | POA: Diagnosis not present

## 2014-08-28 DIAGNOSIS — I10 Essential (primary) hypertension: Secondary | ICD-10-CM | POA: Diagnosis not present

## 2014-08-29 DIAGNOSIS — N400 Enlarged prostate without lower urinary tract symptoms: Secondary | ICD-10-CM | POA: Diagnosis not present

## 2014-08-29 DIAGNOSIS — I10 Essential (primary) hypertension: Secondary | ICD-10-CM | POA: Diagnosis not present

## 2014-08-29 DIAGNOSIS — E1149 Type 2 diabetes mellitus with other diabetic neurological complication: Secondary | ICD-10-CM | POA: Diagnosis not present

## 2014-08-29 DIAGNOSIS — E780 Pure hypercholesterolemia: Secondary | ICD-10-CM | POA: Diagnosis not present

## 2014-08-29 DIAGNOSIS — L02219 Cutaneous abscess of trunk, unspecified: Secondary | ICD-10-CM | POA: Diagnosis not present

## 2014-08-29 DIAGNOSIS — D649 Anemia, unspecified: Secondary | ICD-10-CM | POA: Diagnosis not present

## 2014-08-29 DIAGNOSIS — Z794 Long term (current) use of insulin: Secondary | ICD-10-CM | POA: Diagnosis not present

## 2014-08-29 DIAGNOSIS — L03319 Cellulitis of trunk, unspecified: Secondary | ICD-10-CM | POA: Diagnosis not present

## 2014-08-29 DIAGNOSIS — E1165 Type 2 diabetes mellitus with hyperglycemia: Secondary | ICD-10-CM | POA: Diagnosis not present

## 2014-08-29 DIAGNOSIS — N179 Acute kidney failure, unspecified: Secondary | ICD-10-CM | POA: Diagnosis not present

## 2014-08-30 DIAGNOSIS — L03319 Cellulitis of trunk, unspecified: Secondary | ICD-10-CM | POA: Diagnosis not present

## 2014-08-30 DIAGNOSIS — I10 Essential (primary) hypertension: Secondary | ICD-10-CM | POA: Diagnosis not present

## 2014-08-30 DIAGNOSIS — E1165 Type 2 diabetes mellitus with hyperglycemia: Secondary | ICD-10-CM | POA: Diagnosis not present

## 2014-08-30 DIAGNOSIS — Z794 Long term (current) use of insulin: Secondary | ICD-10-CM | POA: Diagnosis not present

## 2014-08-30 DIAGNOSIS — E780 Pure hypercholesterolemia: Secondary | ICD-10-CM | POA: Diagnosis not present

## 2014-08-31 DIAGNOSIS — L03319 Cellulitis of trunk, unspecified: Secondary | ICD-10-CM | POA: Diagnosis not present

## 2014-08-31 DIAGNOSIS — E780 Pure hypercholesterolemia: Secondary | ICD-10-CM | POA: Diagnosis not present

## 2014-08-31 DIAGNOSIS — Z794 Long term (current) use of insulin: Secondary | ICD-10-CM | POA: Diagnosis not present

## 2014-08-31 DIAGNOSIS — I10 Essential (primary) hypertension: Secondary | ICD-10-CM | POA: Diagnosis not present

## 2014-08-31 DIAGNOSIS — E1165 Type 2 diabetes mellitus with hyperglycemia: Secondary | ICD-10-CM | POA: Diagnosis not present

## 2014-09-01 DIAGNOSIS — Z794 Long term (current) use of insulin: Secondary | ICD-10-CM | POA: Diagnosis not present

## 2014-09-01 DIAGNOSIS — E1165 Type 2 diabetes mellitus with hyperglycemia: Secondary | ICD-10-CM | POA: Diagnosis not present

## 2014-09-01 DIAGNOSIS — I10 Essential (primary) hypertension: Secondary | ICD-10-CM | POA: Diagnosis not present

## 2014-09-01 DIAGNOSIS — L03319 Cellulitis of trunk, unspecified: Secondary | ICD-10-CM | POA: Diagnosis not present

## 2014-09-01 DIAGNOSIS — E780 Pure hypercholesterolemia: Secondary | ICD-10-CM | POA: Diagnosis not present

## 2014-09-02 DIAGNOSIS — E780 Pure hypercholesterolemia: Secondary | ICD-10-CM | POA: Diagnosis not present

## 2014-09-02 DIAGNOSIS — E1165 Type 2 diabetes mellitus with hyperglycemia: Secondary | ICD-10-CM | POA: Diagnosis not present

## 2014-09-02 DIAGNOSIS — Z794 Long term (current) use of insulin: Secondary | ICD-10-CM | POA: Diagnosis not present

## 2014-09-02 DIAGNOSIS — I10 Essential (primary) hypertension: Secondary | ICD-10-CM | POA: Diagnosis not present

## 2014-09-02 DIAGNOSIS — L03319 Cellulitis of trunk, unspecified: Secondary | ICD-10-CM | POA: Diagnosis not present

## 2014-09-03 DIAGNOSIS — E780 Pure hypercholesterolemia: Secondary | ICD-10-CM | POA: Diagnosis not present

## 2014-09-03 DIAGNOSIS — I10 Essential (primary) hypertension: Secondary | ICD-10-CM | POA: Diagnosis not present

## 2014-09-03 DIAGNOSIS — Z794 Long term (current) use of insulin: Secondary | ICD-10-CM | POA: Diagnosis not present

## 2014-09-03 DIAGNOSIS — L03319 Cellulitis of trunk, unspecified: Secondary | ICD-10-CM | POA: Diagnosis not present

## 2014-09-03 DIAGNOSIS — E1165 Type 2 diabetes mellitus with hyperglycemia: Secondary | ICD-10-CM | POA: Diagnosis not present

## 2014-09-04 DIAGNOSIS — Z794 Long term (current) use of insulin: Secondary | ICD-10-CM | POA: Diagnosis not present

## 2014-09-04 DIAGNOSIS — E1165 Type 2 diabetes mellitus with hyperglycemia: Secondary | ICD-10-CM | POA: Diagnosis not present

## 2014-09-04 DIAGNOSIS — L03319 Cellulitis of trunk, unspecified: Secondary | ICD-10-CM | POA: Diagnosis not present

## 2014-09-04 DIAGNOSIS — I10 Essential (primary) hypertension: Secondary | ICD-10-CM | POA: Diagnosis not present

## 2014-09-04 DIAGNOSIS — L0390 Cellulitis, unspecified: Secondary | ICD-10-CM | POA: Diagnosis not present

## 2014-09-04 DIAGNOSIS — L0291 Cutaneous abscess, unspecified: Secondary | ICD-10-CM | POA: Diagnosis not present

## 2014-09-04 DIAGNOSIS — E780 Pure hypercholesterolemia: Secondary | ICD-10-CM | POA: Diagnosis not present

## 2014-09-05 DIAGNOSIS — E1165 Type 2 diabetes mellitus with hyperglycemia: Secondary | ICD-10-CM | POA: Diagnosis not present

## 2014-09-05 DIAGNOSIS — E780 Pure hypercholesterolemia: Secondary | ICD-10-CM | POA: Diagnosis not present

## 2014-09-05 DIAGNOSIS — Z794 Long term (current) use of insulin: Secondary | ICD-10-CM | POA: Diagnosis not present

## 2014-09-05 DIAGNOSIS — L03319 Cellulitis of trunk, unspecified: Secondary | ICD-10-CM | POA: Diagnosis not present

## 2014-09-05 DIAGNOSIS — I10 Essential (primary) hypertension: Secondary | ICD-10-CM | POA: Diagnosis not present

## 2014-09-06 DIAGNOSIS — L03319 Cellulitis of trunk, unspecified: Secondary | ICD-10-CM | POA: Diagnosis not present

## 2014-09-06 DIAGNOSIS — E1165 Type 2 diabetes mellitus with hyperglycemia: Secondary | ICD-10-CM | POA: Diagnosis not present

## 2014-09-06 DIAGNOSIS — E780 Pure hypercholesterolemia: Secondary | ICD-10-CM | POA: Diagnosis not present

## 2014-09-06 DIAGNOSIS — Z794 Long term (current) use of insulin: Secondary | ICD-10-CM | POA: Diagnosis not present

## 2014-09-06 DIAGNOSIS — I10 Essential (primary) hypertension: Secondary | ICD-10-CM | POA: Diagnosis not present

## 2014-09-07 DIAGNOSIS — L0390 Cellulitis, unspecified: Secondary | ICD-10-CM | POA: Diagnosis not present

## 2014-09-07 DIAGNOSIS — E780 Pure hypercholesterolemia: Secondary | ICD-10-CM | POA: Diagnosis not present

## 2014-09-07 DIAGNOSIS — L03319 Cellulitis of trunk, unspecified: Secondary | ICD-10-CM | POA: Diagnosis not present

## 2014-09-07 DIAGNOSIS — Z794 Long term (current) use of insulin: Secondary | ICD-10-CM | POA: Diagnosis not present

## 2014-09-07 DIAGNOSIS — E1165 Type 2 diabetes mellitus with hyperglycemia: Secondary | ICD-10-CM | POA: Diagnosis not present

## 2014-09-07 DIAGNOSIS — I10 Essential (primary) hypertension: Secondary | ICD-10-CM | POA: Diagnosis not present

## 2014-09-07 DIAGNOSIS — L0291 Cutaneous abscess, unspecified: Secondary | ICD-10-CM | POA: Diagnosis not present

## 2014-09-08 DIAGNOSIS — Z794 Long term (current) use of insulin: Secondary | ICD-10-CM | POA: Diagnosis not present

## 2014-09-08 DIAGNOSIS — L03319 Cellulitis of trunk, unspecified: Secondary | ICD-10-CM | POA: Diagnosis not present

## 2014-09-08 DIAGNOSIS — I10 Essential (primary) hypertension: Secondary | ICD-10-CM | POA: Diagnosis not present

## 2014-09-08 DIAGNOSIS — E1165 Type 2 diabetes mellitus with hyperglycemia: Secondary | ICD-10-CM | POA: Diagnosis not present

## 2014-09-08 DIAGNOSIS — E780 Pure hypercholesterolemia: Secondary | ICD-10-CM | POA: Diagnosis not present

## 2014-09-09 DIAGNOSIS — E780 Pure hypercholesterolemia: Secondary | ICD-10-CM | POA: Diagnosis not present

## 2014-09-09 DIAGNOSIS — E1165 Type 2 diabetes mellitus with hyperglycemia: Secondary | ICD-10-CM | POA: Diagnosis not present

## 2014-09-09 DIAGNOSIS — Z794 Long term (current) use of insulin: Secondary | ICD-10-CM | POA: Diagnosis not present

## 2014-09-09 DIAGNOSIS — I10 Essential (primary) hypertension: Secondary | ICD-10-CM | POA: Diagnosis not present

## 2014-09-09 DIAGNOSIS — L03319 Cellulitis of trunk, unspecified: Secondary | ICD-10-CM | POA: Diagnosis not present

## 2014-09-10 DIAGNOSIS — I10 Essential (primary) hypertension: Secondary | ICD-10-CM | POA: Diagnosis not present

## 2014-09-10 DIAGNOSIS — E1165 Type 2 diabetes mellitus with hyperglycemia: Secondary | ICD-10-CM | POA: Diagnosis not present

## 2014-09-10 DIAGNOSIS — Z794 Long term (current) use of insulin: Secondary | ICD-10-CM | POA: Diagnosis not present

## 2014-09-10 DIAGNOSIS — L03319 Cellulitis of trunk, unspecified: Secondary | ICD-10-CM | POA: Diagnosis not present

## 2014-09-10 DIAGNOSIS — E780 Pure hypercholesterolemia: Secondary | ICD-10-CM | POA: Diagnosis not present

## 2014-09-11 DIAGNOSIS — I10 Essential (primary) hypertension: Secondary | ICD-10-CM | POA: Diagnosis not present

## 2014-09-11 DIAGNOSIS — E1165 Type 2 diabetes mellitus with hyperglycemia: Secondary | ICD-10-CM | POA: Diagnosis not present

## 2014-09-11 DIAGNOSIS — E780 Pure hypercholesterolemia: Secondary | ICD-10-CM | POA: Diagnosis not present

## 2014-09-11 DIAGNOSIS — L03319 Cellulitis of trunk, unspecified: Secondary | ICD-10-CM | POA: Diagnosis not present

## 2014-09-11 DIAGNOSIS — Z794 Long term (current) use of insulin: Secondary | ICD-10-CM | POA: Diagnosis not present

## 2014-09-12 DIAGNOSIS — L03319 Cellulitis of trunk, unspecified: Secondary | ICD-10-CM | POA: Diagnosis not present

## 2014-09-12 DIAGNOSIS — E1165 Type 2 diabetes mellitus with hyperglycemia: Secondary | ICD-10-CM | POA: Diagnosis not present

## 2014-09-12 DIAGNOSIS — Z794 Long term (current) use of insulin: Secondary | ICD-10-CM | POA: Diagnosis not present

## 2014-09-12 DIAGNOSIS — I10 Essential (primary) hypertension: Secondary | ICD-10-CM | POA: Diagnosis not present

## 2014-09-12 DIAGNOSIS — E780 Pure hypercholesterolemia: Secondary | ICD-10-CM | POA: Diagnosis not present

## 2014-09-13 DIAGNOSIS — L03319 Cellulitis of trunk, unspecified: Secondary | ICD-10-CM | POA: Diagnosis not present

## 2014-09-13 DIAGNOSIS — I10 Essential (primary) hypertension: Secondary | ICD-10-CM | POA: Diagnosis not present

## 2014-09-13 DIAGNOSIS — Z794 Long term (current) use of insulin: Secondary | ICD-10-CM | POA: Diagnosis not present

## 2014-09-13 DIAGNOSIS — E1165 Type 2 diabetes mellitus with hyperglycemia: Secondary | ICD-10-CM | POA: Diagnosis not present

## 2014-09-13 DIAGNOSIS — E780 Pure hypercholesterolemia: Secondary | ICD-10-CM | POA: Diagnosis not present

## 2014-09-14 DIAGNOSIS — Z794 Long term (current) use of insulin: Secondary | ICD-10-CM | POA: Diagnosis not present

## 2014-09-14 DIAGNOSIS — L03319 Cellulitis of trunk, unspecified: Secondary | ICD-10-CM | POA: Diagnosis not present

## 2014-09-14 DIAGNOSIS — E1165 Type 2 diabetes mellitus with hyperglycemia: Secondary | ICD-10-CM | POA: Diagnosis not present

## 2014-09-14 DIAGNOSIS — E780 Pure hypercholesterolemia: Secondary | ICD-10-CM | POA: Diagnosis not present

## 2014-09-14 DIAGNOSIS — I10 Essential (primary) hypertension: Secondary | ICD-10-CM | POA: Diagnosis not present

## 2014-09-15 DIAGNOSIS — E780 Pure hypercholesterolemia: Secondary | ICD-10-CM | POA: Diagnosis not present

## 2014-09-15 DIAGNOSIS — E1165 Type 2 diabetes mellitus with hyperglycemia: Secondary | ICD-10-CM | POA: Diagnosis not present

## 2014-09-15 DIAGNOSIS — Z794 Long term (current) use of insulin: Secondary | ICD-10-CM | POA: Diagnosis not present

## 2014-09-15 DIAGNOSIS — I10 Essential (primary) hypertension: Secondary | ICD-10-CM | POA: Diagnosis not present

## 2014-09-15 DIAGNOSIS — L03319 Cellulitis of trunk, unspecified: Secondary | ICD-10-CM | POA: Diagnosis not present

## 2014-09-16 DIAGNOSIS — I10 Essential (primary) hypertension: Secondary | ICD-10-CM | POA: Diagnosis not present

## 2014-09-16 DIAGNOSIS — Z794 Long term (current) use of insulin: Secondary | ICD-10-CM | POA: Diagnosis not present

## 2014-09-16 DIAGNOSIS — E780 Pure hypercholesterolemia: Secondary | ICD-10-CM | POA: Diagnosis not present

## 2014-09-16 DIAGNOSIS — E1165 Type 2 diabetes mellitus with hyperglycemia: Secondary | ICD-10-CM | POA: Diagnosis not present

## 2014-09-16 DIAGNOSIS — L03319 Cellulitis of trunk, unspecified: Secondary | ICD-10-CM | POA: Diagnosis not present

## 2014-09-17 DIAGNOSIS — I10 Essential (primary) hypertension: Secondary | ICD-10-CM | POA: Diagnosis not present

## 2014-09-17 DIAGNOSIS — L03319 Cellulitis of trunk, unspecified: Secondary | ICD-10-CM | POA: Diagnosis not present

## 2014-09-17 DIAGNOSIS — E1165 Type 2 diabetes mellitus with hyperglycemia: Secondary | ICD-10-CM | POA: Diagnosis not present

## 2014-09-17 DIAGNOSIS — Z794 Long term (current) use of insulin: Secondary | ICD-10-CM | POA: Diagnosis not present

## 2014-09-17 DIAGNOSIS — E780 Pure hypercholesterolemia: Secondary | ICD-10-CM | POA: Diagnosis not present

## 2014-09-18 DIAGNOSIS — E1165 Type 2 diabetes mellitus with hyperglycemia: Secondary | ICD-10-CM | POA: Diagnosis not present

## 2014-09-18 DIAGNOSIS — Z794 Long term (current) use of insulin: Secondary | ICD-10-CM | POA: Diagnosis not present

## 2014-09-18 DIAGNOSIS — I10 Essential (primary) hypertension: Secondary | ICD-10-CM | POA: Diagnosis not present

## 2014-09-18 DIAGNOSIS — E780 Pure hypercholesterolemia: Secondary | ICD-10-CM | POA: Diagnosis not present

## 2014-09-18 DIAGNOSIS — L03319 Cellulitis of trunk, unspecified: Secondary | ICD-10-CM | POA: Diagnosis not present

## 2014-09-19 DIAGNOSIS — I10 Essential (primary) hypertension: Secondary | ICD-10-CM | POA: Diagnosis not present

## 2014-09-19 DIAGNOSIS — L03319 Cellulitis of trunk, unspecified: Secondary | ICD-10-CM | POA: Diagnosis not present

## 2014-09-19 DIAGNOSIS — E1165 Type 2 diabetes mellitus with hyperglycemia: Secondary | ICD-10-CM | POA: Diagnosis not present

## 2014-09-19 DIAGNOSIS — E780 Pure hypercholesterolemia: Secondary | ICD-10-CM | POA: Diagnosis not present

## 2014-09-19 DIAGNOSIS — Z794 Long term (current) use of insulin: Secondary | ICD-10-CM | POA: Diagnosis not present

## 2014-09-20 DIAGNOSIS — E780 Pure hypercholesterolemia: Secondary | ICD-10-CM | POA: Diagnosis not present

## 2014-09-20 DIAGNOSIS — I10 Essential (primary) hypertension: Secondary | ICD-10-CM | POA: Diagnosis not present

## 2014-09-20 DIAGNOSIS — Z794 Long term (current) use of insulin: Secondary | ICD-10-CM | POA: Diagnosis not present

## 2014-09-20 DIAGNOSIS — E1165 Type 2 diabetes mellitus with hyperglycemia: Secondary | ICD-10-CM | POA: Diagnosis not present

## 2014-09-20 DIAGNOSIS — L03314 Cellulitis of groin: Secondary | ICD-10-CM | POA: Diagnosis not present

## 2014-09-20 DIAGNOSIS — L03319 Cellulitis of trunk, unspecified: Secondary | ICD-10-CM | POA: Diagnosis not present

## 2014-09-21 DIAGNOSIS — I10 Essential (primary) hypertension: Secondary | ICD-10-CM | POA: Diagnosis not present

## 2014-09-21 DIAGNOSIS — E780 Pure hypercholesterolemia: Secondary | ICD-10-CM | POA: Diagnosis not present

## 2014-09-21 DIAGNOSIS — Z794 Long term (current) use of insulin: Secondary | ICD-10-CM | POA: Diagnosis not present

## 2014-09-21 DIAGNOSIS — E1165 Type 2 diabetes mellitus with hyperglycemia: Secondary | ICD-10-CM | POA: Diagnosis not present

## 2014-09-21 DIAGNOSIS — L03319 Cellulitis of trunk, unspecified: Secondary | ICD-10-CM | POA: Diagnosis not present

## 2014-09-22 DIAGNOSIS — L03319 Cellulitis of trunk, unspecified: Secondary | ICD-10-CM | POA: Diagnosis not present

## 2014-09-22 DIAGNOSIS — E1165 Type 2 diabetes mellitus with hyperglycemia: Secondary | ICD-10-CM | POA: Diagnosis not present

## 2014-09-22 DIAGNOSIS — Z794 Long term (current) use of insulin: Secondary | ICD-10-CM | POA: Diagnosis not present

## 2014-09-22 DIAGNOSIS — E780 Pure hypercholesterolemia: Secondary | ICD-10-CM | POA: Diagnosis not present

## 2014-09-22 DIAGNOSIS — I10 Essential (primary) hypertension: Secondary | ICD-10-CM | POA: Diagnosis not present

## 2014-09-23 DIAGNOSIS — I10 Essential (primary) hypertension: Secondary | ICD-10-CM | POA: Diagnosis not present

## 2014-09-23 DIAGNOSIS — E780 Pure hypercholesterolemia: Secondary | ICD-10-CM | POA: Diagnosis not present

## 2014-09-23 DIAGNOSIS — E1165 Type 2 diabetes mellitus with hyperglycemia: Secondary | ICD-10-CM | POA: Diagnosis not present

## 2014-09-23 DIAGNOSIS — Z794 Long term (current) use of insulin: Secondary | ICD-10-CM | POA: Diagnosis not present

## 2014-09-23 DIAGNOSIS — L03319 Cellulitis of trunk, unspecified: Secondary | ICD-10-CM | POA: Diagnosis not present

## 2014-09-24 DIAGNOSIS — E780 Pure hypercholesterolemia: Secondary | ICD-10-CM | POA: Diagnosis not present

## 2014-09-24 DIAGNOSIS — Z794 Long term (current) use of insulin: Secondary | ICD-10-CM | POA: Diagnosis not present

## 2014-09-24 DIAGNOSIS — L03319 Cellulitis of trunk, unspecified: Secondary | ICD-10-CM | POA: Diagnosis not present

## 2014-09-24 DIAGNOSIS — E1165 Type 2 diabetes mellitus with hyperglycemia: Secondary | ICD-10-CM | POA: Diagnosis not present

## 2014-09-24 DIAGNOSIS — I10 Essential (primary) hypertension: Secondary | ICD-10-CM | POA: Diagnosis not present

## 2014-09-25 DIAGNOSIS — E1165 Type 2 diabetes mellitus with hyperglycemia: Secondary | ICD-10-CM | POA: Diagnosis not present

## 2014-09-25 DIAGNOSIS — Z794 Long term (current) use of insulin: Secondary | ICD-10-CM | POA: Diagnosis not present

## 2014-09-25 DIAGNOSIS — I10 Essential (primary) hypertension: Secondary | ICD-10-CM | POA: Diagnosis not present

## 2014-09-25 DIAGNOSIS — L03319 Cellulitis of trunk, unspecified: Secondary | ICD-10-CM | POA: Diagnosis not present

## 2014-09-25 DIAGNOSIS — E780 Pure hypercholesterolemia: Secondary | ICD-10-CM | POA: Diagnosis not present

## 2014-09-26 DIAGNOSIS — I10 Essential (primary) hypertension: Secondary | ICD-10-CM | POA: Diagnosis not present

## 2014-09-26 DIAGNOSIS — Z794 Long term (current) use of insulin: Secondary | ICD-10-CM | POA: Diagnosis not present

## 2014-09-26 DIAGNOSIS — L03319 Cellulitis of trunk, unspecified: Secondary | ICD-10-CM | POA: Diagnosis not present

## 2014-09-26 DIAGNOSIS — E780 Pure hypercholesterolemia: Secondary | ICD-10-CM | POA: Diagnosis not present

## 2014-09-26 DIAGNOSIS — E1165 Type 2 diabetes mellitus with hyperglycemia: Secondary | ICD-10-CM | POA: Diagnosis not present

## 2014-09-27 DIAGNOSIS — E1165 Type 2 diabetes mellitus with hyperglycemia: Secondary | ICD-10-CM | POA: Diagnosis not present

## 2014-09-27 DIAGNOSIS — Z794 Long term (current) use of insulin: Secondary | ICD-10-CM | POA: Diagnosis not present

## 2014-09-27 DIAGNOSIS — I10 Essential (primary) hypertension: Secondary | ICD-10-CM | POA: Diagnosis not present

## 2014-09-27 DIAGNOSIS — E780 Pure hypercholesterolemia: Secondary | ICD-10-CM | POA: Diagnosis not present

## 2014-09-27 DIAGNOSIS — L03319 Cellulitis of trunk, unspecified: Secondary | ICD-10-CM | POA: Diagnosis not present

## 2014-09-28 DIAGNOSIS — E1165 Type 2 diabetes mellitus with hyperglycemia: Secondary | ICD-10-CM | POA: Diagnosis not present

## 2014-09-28 DIAGNOSIS — L03319 Cellulitis of trunk, unspecified: Secondary | ICD-10-CM | POA: Diagnosis not present

## 2014-09-28 DIAGNOSIS — Z794 Long term (current) use of insulin: Secondary | ICD-10-CM | POA: Diagnosis not present

## 2014-09-28 DIAGNOSIS — I10 Essential (primary) hypertension: Secondary | ICD-10-CM | POA: Diagnosis not present

## 2014-09-28 DIAGNOSIS — E780 Pure hypercholesterolemia: Secondary | ICD-10-CM | POA: Diagnosis not present

## 2014-09-29 DIAGNOSIS — E1165 Type 2 diabetes mellitus with hyperglycemia: Secondary | ICD-10-CM | POA: Diagnosis not present

## 2014-09-29 DIAGNOSIS — Z794 Long term (current) use of insulin: Secondary | ICD-10-CM | POA: Diagnosis not present

## 2014-09-29 DIAGNOSIS — E780 Pure hypercholesterolemia: Secondary | ICD-10-CM | POA: Diagnosis not present

## 2014-09-29 DIAGNOSIS — I10 Essential (primary) hypertension: Secondary | ICD-10-CM | POA: Diagnosis not present

## 2014-09-29 DIAGNOSIS — L03319 Cellulitis of trunk, unspecified: Secondary | ICD-10-CM | POA: Diagnosis not present

## 2014-09-30 DIAGNOSIS — I10 Essential (primary) hypertension: Secondary | ICD-10-CM | POA: Diagnosis not present

## 2014-09-30 DIAGNOSIS — E1165 Type 2 diabetes mellitus with hyperglycemia: Secondary | ICD-10-CM | POA: Diagnosis not present

## 2014-09-30 DIAGNOSIS — Z794 Long term (current) use of insulin: Secondary | ICD-10-CM | POA: Diagnosis not present

## 2014-09-30 DIAGNOSIS — L03319 Cellulitis of trunk, unspecified: Secondary | ICD-10-CM | POA: Diagnosis not present

## 2014-09-30 DIAGNOSIS — E780 Pure hypercholesterolemia: Secondary | ICD-10-CM | POA: Diagnosis not present

## 2014-10-01 DIAGNOSIS — L03319 Cellulitis of trunk, unspecified: Secondary | ICD-10-CM | POA: Diagnosis not present

## 2014-10-01 DIAGNOSIS — I10 Essential (primary) hypertension: Secondary | ICD-10-CM | POA: Diagnosis not present

## 2014-10-01 DIAGNOSIS — E1165 Type 2 diabetes mellitus with hyperglycemia: Secondary | ICD-10-CM | POA: Diagnosis not present

## 2014-10-01 DIAGNOSIS — Z794 Long term (current) use of insulin: Secondary | ICD-10-CM | POA: Diagnosis not present

## 2014-10-01 DIAGNOSIS — E780 Pure hypercholesterolemia: Secondary | ICD-10-CM | POA: Diagnosis not present

## 2014-10-02 DIAGNOSIS — E1165 Type 2 diabetes mellitus with hyperglycemia: Secondary | ICD-10-CM | POA: Diagnosis not present

## 2014-10-02 DIAGNOSIS — L03319 Cellulitis of trunk, unspecified: Secondary | ICD-10-CM | POA: Diagnosis not present

## 2014-10-02 DIAGNOSIS — I10 Essential (primary) hypertension: Secondary | ICD-10-CM | POA: Diagnosis not present

## 2014-10-02 DIAGNOSIS — Z794 Long term (current) use of insulin: Secondary | ICD-10-CM | POA: Diagnosis not present

## 2014-10-02 DIAGNOSIS — E780 Pure hypercholesterolemia: Secondary | ICD-10-CM | POA: Diagnosis not present

## 2014-10-03 DIAGNOSIS — Z9181 History of falling: Secondary | ICD-10-CM | POA: Diagnosis not present

## 2014-10-03 DIAGNOSIS — L03319 Cellulitis of trunk, unspecified: Secondary | ICD-10-CM | POA: Diagnosis not present

## 2014-10-03 DIAGNOSIS — R42 Dizziness and giddiness: Secondary | ICD-10-CM | POA: Diagnosis not present

## 2014-10-03 DIAGNOSIS — N400 Enlarged prostate without lower urinary tract symptoms: Secondary | ICD-10-CM | POA: Diagnosis not present

## 2014-10-03 DIAGNOSIS — E780 Pure hypercholesterolemia: Secondary | ICD-10-CM | POA: Diagnosis not present

## 2014-10-03 DIAGNOSIS — E1149 Type 2 diabetes mellitus with other diabetic neurological complication: Secondary | ICD-10-CM | POA: Diagnosis not present

## 2014-10-03 DIAGNOSIS — I10 Essential (primary) hypertension: Secondary | ICD-10-CM | POA: Diagnosis not present

## 2014-10-03 DIAGNOSIS — E1165 Type 2 diabetes mellitus with hyperglycemia: Secondary | ICD-10-CM | POA: Diagnosis not present

## 2014-10-03 DIAGNOSIS — Z794 Long term (current) use of insulin: Secondary | ICD-10-CM | POA: Diagnosis not present

## 2014-10-04 DIAGNOSIS — L03319 Cellulitis of trunk, unspecified: Secondary | ICD-10-CM | POA: Diagnosis not present

## 2014-10-04 DIAGNOSIS — E780 Pure hypercholesterolemia: Secondary | ICD-10-CM | POA: Diagnosis not present

## 2014-10-04 DIAGNOSIS — Z794 Long term (current) use of insulin: Secondary | ICD-10-CM | POA: Diagnosis not present

## 2014-10-04 DIAGNOSIS — E1165 Type 2 diabetes mellitus with hyperglycemia: Secondary | ICD-10-CM | POA: Diagnosis not present

## 2014-10-04 DIAGNOSIS — I10 Essential (primary) hypertension: Secondary | ICD-10-CM | POA: Diagnosis not present

## 2014-10-05 DIAGNOSIS — E780 Pure hypercholesterolemia: Secondary | ICD-10-CM | POA: Diagnosis not present

## 2014-10-05 DIAGNOSIS — L03319 Cellulitis of trunk, unspecified: Secondary | ICD-10-CM | POA: Diagnosis not present

## 2014-10-05 DIAGNOSIS — I10 Essential (primary) hypertension: Secondary | ICD-10-CM | POA: Diagnosis not present

## 2014-10-05 DIAGNOSIS — Z794 Long term (current) use of insulin: Secondary | ICD-10-CM | POA: Diagnosis not present

## 2014-10-05 DIAGNOSIS — E1165 Type 2 diabetes mellitus with hyperglycemia: Secondary | ICD-10-CM | POA: Diagnosis not present

## 2014-10-06 DIAGNOSIS — L03319 Cellulitis of trunk, unspecified: Secondary | ICD-10-CM | POA: Diagnosis not present

## 2014-10-06 DIAGNOSIS — I10 Essential (primary) hypertension: Secondary | ICD-10-CM | POA: Diagnosis not present

## 2014-10-06 DIAGNOSIS — E1165 Type 2 diabetes mellitus with hyperglycemia: Secondary | ICD-10-CM | POA: Diagnosis not present

## 2014-10-06 DIAGNOSIS — E780 Pure hypercholesterolemia: Secondary | ICD-10-CM | POA: Diagnosis not present

## 2014-10-06 DIAGNOSIS — Z794 Long term (current) use of insulin: Secondary | ICD-10-CM | POA: Diagnosis not present

## 2014-10-09 DIAGNOSIS — E780 Pure hypercholesterolemia: Secondary | ICD-10-CM | POA: Diagnosis not present

## 2014-10-09 DIAGNOSIS — Z794 Long term (current) use of insulin: Secondary | ICD-10-CM | POA: Diagnosis not present

## 2014-10-09 DIAGNOSIS — I10 Essential (primary) hypertension: Secondary | ICD-10-CM | POA: Diagnosis not present

## 2014-10-09 DIAGNOSIS — L03319 Cellulitis of trunk, unspecified: Secondary | ICD-10-CM | POA: Diagnosis not present

## 2014-10-09 DIAGNOSIS — E1165 Type 2 diabetes mellitus with hyperglycemia: Secondary | ICD-10-CM | POA: Diagnosis not present

## 2014-10-13 DIAGNOSIS — E1165 Type 2 diabetes mellitus with hyperglycemia: Secondary | ICD-10-CM | POA: Diagnosis not present

## 2014-10-13 DIAGNOSIS — I10 Essential (primary) hypertension: Secondary | ICD-10-CM | POA: Diagnosis not present

## 2014-10-13 DIAGNOSIS — Z794 Long term (current) use of insulin: Secondary | ICD-10-CM | POA: Diagnosis not present

## 2014-10-13 DIAGNOSIS — E780 Pure hypercholesterolemia: Secondary | ICD-10-CM | POA: Diagnosis not present

## 2014-10-13 DIAGNOSIS — L03319 Cellulitis of trunk, unspecified: Secondary | ICD-10-CM | POA: Diagnosis not present

## 2014-10-16 DIAGNOSIS — E1165 Type 2 diabetes mellitus with hyperglycemia: Secondary | ICD-10-CM | POA: Diagnosis not present

## 2014-10-16 DIAGNOSIS — L03319 Cellulitis of trunk, unspecified: Secondary | ICD-10-CM | POA: Diagnosis not present

## 2014-10-16 DIAGNOSIS — E780 Pure hypercholesterolemia: Secondary | ICD-10-CM | POA: Diagnosis not present

## 2014-10-16 DIAGNOSIS — Z794 Long term (current) use of insulin: Secondary | ICD-10-CM | POA: Diagnosis not present

## 2014-10-16 DIAGNOSIS — I10 Essential (primary) hypertension: Secondary | ICD-10-CM | POA: Diagnosis not present

## 2014-10-17 DIAGNOSIS — N400 Enlarged prostate without lower urinary tract symptoms: Secondary | ICD-10-CM | POA: Diagnosis not present

## 2014-10-17 DIAGNOSIS — I10 Essential (primary) hypertension: Secondary | ICD-10-CM | POA: Diagnosis not present

## 2014-10-17 DIAGNOSIS — L02214 Cutaneous abscess of groin: Secondary | ICD-10-CM | POA: Diagnosis not present

## 2014-10-17 DIAGNOSIS — E1149 Type 2 diabetes mellitus with other diabetic neurological complication: Secondary | ICD-10-CM | POA: Diagnosis not present

## 2014-10-24 DIAGNOSIS — L0390 Cellulitis, unspecified: Secondary | ICD-10-CM | POA: Diagnosis not present

## 2014-10-24 DIAGNOSIS — N401 Enlarged prostate with lower urinary tract symptoms: Secondary | ICD-10-CM | POA: Diagnosis not present

## 2014-12-21 DIAGNOSIS — Z79899 Other long term (current) drug therapy: Secondary | ICD-10-CM | POA: Diagnosis not present

## 2014-12-21 DIAGNOSIS — M25512 Pain in left shoulder: Secondary | ICD-10-CM | POA: Diagnosis not present

## 2014-12-21 DIAGNOSIS — E782 Mixed hyperlipidemia: Secondary | ICD-10-CM | POA: Diagnosis not present

## 2014-12-21 DIAGNOSIS — I10 Essential (primary) hypertension: Secondary | ICD-10-CM | POA: Diagnosis not present

## 2014-12-21 DIAGNOSIS — E1149 Type 2 diabetes mellitus with other diabetic neurological complication: Secondary | ICD-10-CM | POA: Diagnosis not present

## 2014-12-21 DIAGNOSIS — E1140 Type 2 diabetes mellitus with diabetic neuropathy, unspecified: Secondary | ICD-10-CM | POA: Diagnosis not present

## 2014-12-21 DIAGNOSIS — F329 Major depressive disorder, single episode, unspecified: Secondary | ICD-10-CM | POA: Diagnosis not present

## 2014-12-21 DIAGNOSIS — Z1389 Encounter for screening for other disorder: Secondary | ICD-10-CM | POA: Diagnosis not present

## 2014-12-26 DIAGNOSIS — N529 Male erectile dysfunction, unspecified: Secondary | ICD-10-CM | POA: Diagnosis not present

## 2014-12-26 DIAGNOSIS — L0390 Cellulitis, unspecified: Secondary | ICD-10-CM | POA: Diagnosis not present

## 2014-12-26 DIAGNOSIS — N401 Enlarged prostate with lower urinary tract symptoms: Secondary | ICD-10-CM | POA: Diagnosis not present

## 2015-01-23 DIAGNOSIS — N401 Enlarged prostate with lower urinary tract symptoms: Secondary | ICD-10-CM | POA: Diagnosis not present

## 2015-01-23 DIAGNOSIS — N529 Male erectile dysfunction, unspecified: Secondary | ICD-10-CM | POA: Diagnosis not present

## 2015-01-23 DIAGNOSIS — N419 Inflammatory disease of prostate, unspecified: Secondary | ICD-10-CM | POA: Diagnosis not present

## 2015-01-26 DIAGNOSIS — N400 Enlarged prostate without lower urinary tract symptoms: Secondary | ICD-10-CM | POA: Diagnosis not present

## 2015-01-26 DIAGNOSIS — R479 Unspecified speech disturbances: Secondary | ICD-10-CM | POA: Diagnosis not present

## 2015-01-26 DIAGNOSIS — E1149 Type 2 diabetes mellitus with other diabetic neurological complication: Secondary | ICD-10-CM | POA: Diagnosis not present

## 2015-01-26 DIAGNOSIS — R42 Dizziness and giddiness: Secondary | ICD-10-CM | POA: Diagnosis not present

## 2015-03-22 DIAGNOSIS — M25512 Pain in left shoulder: Secondary | ICD-10-CM | POA: Diagnosis not present

## 2015-03-22 DIAGNOSIS — I10 Essential (primary) hypertension: Secondary | ICD-10-CM | POA: Diagnosis not present

## 2015-03-22 DIAGNOSIS — E1149 Type 2 diabetes mellitus with other diabetic neurological complication: Secondary | ICD-10-CM | POA: Diagnosis not present

## 2015-03-29 DIAGNOSIS — N401 Enlarged prostate with lower urinary tract symptoms: Secondary | ICD-10-CM | POA: Diagnosis not present

## 2015-03-29 DIAGNOSIS — N529 Male erectile dysfunction, unspecified: Secondary | ICD-10-CM | POA: Diagnosis not present

## 2015-03-29 DIAGNOSIS — L0390 Cellulitis, unspecified: Secondary | ICD-10-CM | POA: Diagnosis not present

## 2015-04-20 DIAGNOSIS — M25369 Other instability, unspecified knee: Secondary | ICD-10-CM | POA: Diagnosis not present

## 2015-04-20 DIAGNOSIS — M549 Dorsalgia, unspecified: Secondary | ICD-10-CM | POA: Diagnosis not present

## 2015-04-24 DIAGNOSIS — N529 Male erectile dysfunction, unspecified: Secondary | ICD-10-CM | POA: Diagnosis not present

## 2015-04-24 DIAGNOSIS — N401 Enlarged prostate with lower urinary tract symptoms: Secondary | ICD-10-CM | POA: Diagnosis not present

## 2015-05-16 DIAGNOSIS — H25812 Combined forms of age-related cataract, left eye: Secondary | ICD-10-CM | POA: Diagnosis not present

## 2015-05-16 DIAGNOSIS — E119 Type 2 diabetes mellitus without complications: Secondary | ICD-10-CM | POA: Diagnosis not present

## 2015-10-03 DIAGNOSIS — M25512 Pain in left shoulder: Secondary | ICD-10-CM | POA: Diagnosis not present

## 2015-10-03 DIAGNOSIS — Z125 Encounter for screening for malignant neoplasm of prostate: Secondary | ICD-10-CM | POA: Diagnosis not present

## 2015-10-03 DIAGNOSIS — E7800 Pure hypercholesterolemia, unspecified: Secondary | ICD-10-CM | POA: Diagnosis not present

## 2015-10-03 DIAGNOSIS — I10 Essential (primary) hypertension: Secondary | ICD-10-CM | POA: Diagnosis not present

## 2015-10-03 DIAGNOSIS — E1149 Type 2 diabetes mellitus with other diabetic neurological complication: Secondary | ICD-10-CM | POA: Diagnosis not present

## 2015-10-03 DIAGNOSIS — Z6830 Body mass index (BMI) 30.0-30.9, adult: Secondary | ICD-10-CM | POA: Diagnosis not present

## 2015-10-26 DIAGNOSIS — Z6829 Body mass index (BMI) 29.0-29.9, adult: Secondary | ICD-10-CM | POA: Diagnosis not present

## 2015-10-26 DIAGNOSIS — L03115 Cellulitis of right lower limb: Secondary | ICD-10-CM | POA: Diagnosis not present

## 2015-10-26 DIAGNOSIS — F329 Major depressive disorder, single episode, unspecified: Secondary | ICD-10-CM | POA: Diagnosis present

## 2015-10-26 DIAGNOSIS — I2510 Atherosclerotic heart disease of native coronary artery without angina pectoris: Secondary | ICD-10-CM | POA: Diagnosis not present

## 2015-10-26 DIAGNOSIS — E1151 Type 2 diabetes mellitus with diabetic peripheral angiopathy without gangrene: Secondary | ICD-10-CM | POA: Diagnosis not present

## 2015-10-26 DIAGNOSIS — I252 Old myocardial infarction: Secondary | ICD-10-CM | POA: Diagnosis not present

## 2015-10-26 DIAGNOSIS — R6510 Systemic inflammatory response syndrome (SIRS) of non-infectious origin without acute organ dysfunction: Secondary | ICD-10-CM | POA: Diagnosis not present

## 2015-10-26 DIAGNOSIS — Z794 Long term (current) use of insulin: Secondary | ICD-10-CM | POA: Diagnosis not present

## 2015-10-26 DIAGNOSIS — N189 Chronic kidney disease, unspecified: Secondary | ICD-10-CM | POA: Diagnosis not present

## 2015-10-26 DIAGNOSIS — I10 Essential (primary) hypertension: Secondary | ICD-10-CM | POA: Diagnosis not present

## 2015-10-26 DIAGNOSIS — Z79899 Other long term (current) drug therapy: Secondary | ICD-10-CM | POA: Diagnosis not present

## 2015-10-26 DIAGNOSIS — I70261 Atherosclerosis of native arteries of extremities with gangrene, right leg: Secondary | ICD-10-CM | POA: Diagnosis not present

## 2015-10-26 DIAGNOSIS — M869 Osteomyelitis, unspecified: Secondary | ICD-10-CM | POA: Diagnosis not present

## 2015-10-26 DIAGNOSIS — E1152 Type 2 diabetes mellitus with diabetic peripheral angiopathy with gangrene: Secondary | ICD-10-CM | POA: Diagnosis not present

## 2015-10-26 DIAGNOSIS — I96 Gangrene, not elsewhere classified: Secondary | ICD-10-CM | POA: Diagnosis not present

## 2015-10-26 DIAGNOSIS — E1169 Type 2 diabetes mellitus with other specified complication: Secondary | ICD-10-CM | POA: Diagnosis not present

## 2015-10-26 DIAGNOSIS — B951 Streptococcus, group B, as the cause of diseases classified elsewhere: Secondary | ICD-10-CM | POA: Diagnosis present

## 2015-10-26 DIAGNOSIS — R262 Difficulty in walking, not elsewhere classified: Secondary | ICD-10-CM | POA: Diagnosis not present

## 2015-10-26 DIAGNOSIS — D649 Anemia, unspecified: Secondary | ICD-10-CM | POA: Diagnosis not present

## 2015-10-26 DIAGNOSIS — R2681 Unsteadiness on feet: Secondary | ICD-10-CM | POA: Diagnosis not present

## 2015-10-26 DIAGNOSIS — L97519 Non-pressure chronic ulcer of other part of right foot with unspecified severity: Secondary | ICD-10-CM | POA: Diagnosis not present

## 2015-10-26 DIAGNOSIS — N181 Chronic kidney disease, stage 1: Secondary | ICD-10-CM | POA: Diagnosis present

## 2015-10-26 DIAGNOSIS — E11621 Type 2 diabetes mellitus with foot ulcer: Secondary | ICD-10-CM | POA: Diagnosis present

## 2015-10-26 DIAGNOSIS — Z89511 Acquired absence of right leg below knee: Secondary | ICD-10-CM | POA: Diagnosis not present

## 2015-10-26 DIAGNOSIS — R279 Unspecified lack of coordination: Secondary | ICD-10-CM | POA: Diagnosis not present

## 2015-10-26 DIAGNOSIS — L0390 Cellulitis, unspecified: Secondary | ICD-10-CM | POA: Diagnosis not present

## 2015-10-26 DIAGNOSIS — L97909 Non-pressure chronic ulcer of unspecified part of unspecified lower leg with unspecified severity: Secondary | ICD-10-CM | POA: Diagnosis not present

## 2015-10-26 DIAGNOSIS — L089 Local infection of the skin and subcutaneous tissue, unspecified: Secondary | ICD-10-CM | POA: Diagnosis not present

## 2015-10-26 DIAGNOSIS — M86671 Other chronic osteomyelitis, right ankle and foot: Secondary | ICD-10-CM | POA: Diagnosis not present

## 2015-10-26 DIAGNOSIS — Z452 Encounter for adjustment and management of vascular access device: Secondary | ICD-10-CM | POA: Diagnosis not present

## 2015-10-26 DIAGNOSIS — M6281 Muscle weakness (generalized): Secondary | ICD-10-CM | POA: Diagnosis not present

## 2015-10-26 DIAGNOSIS — E7800 Pure hypercholesterolemia, unspecified: Secondary | ICD-10-CM | POA: Diagnosis present

## 2015-10-26 DIAGNOSIS — M86272 Subacute osteomyelitis, left ankle and foot: Secondary | ICD-10-CM | POA: Diagnosis not present

## 2015-10-26 DIAGNOSIS — I129 Hypertensive chronic kidney disease with stage 1 through stage 4 chronic kidney disease, or unspecified chronic kidney disease: Secondary | ICD-10-CM | POA: Diagnosis present

## 2015-10-26 DIAGNOSIS — Z7401 Bed confinement status: Secondary | ICD-10-CM | POA: Diagnosis not present

## 2015-10-26 DIAGNOSIS — M86271 Subacute osteomyelitis, right ankle and foot: Secondary | ICD-10-CM | POA: Diagnosis not present

## 2015-10-26 DIAGNOSIS — Z4889 Encounter for other specified surgical aftercare: Secondary | ICD-10-CM | POA: Diagnosis not present

## 2015-10-26 DIAGNOSIS — L0291 Cutaneous abscess, unspecified: Secondary | ICD-10-CM | POA: Diagnosis not present

## 2015-10-26 DIAGNOSIS — I739 Peripheral vascular disease, unspecified: Secondary | ICD-10-CM | POA: Diagnosis not present

## 2015-10-26 DIAGNOSIS — L97529 Non-pressure chronic ulcer of other part of left foot with unspecified severity: Secondary | ICD-10-CM | POA: Diagnosis not present

## 2015-10-26 DIAGNOSIS — E1165 Type 2 diabetes mellitus with hyperglycemia: Secondary | ICD-10-CM | POA: Diagnosis not present

## 2015-10-26 DIAGNOSIS — E11628 Type 2 diabetes mellitus with other skin complications: Secondary | ICD-10-CM | POA: Diagnosis not present

## 2015-10-26 DIAGNOSIS — L97919 Non-pressure chronic ulcer of unspecified part of right lower leg with unspecified severity: Secondary | ICD-10-CM | POA: Diagnosis not present

## 2015-11-12 DIAGNOSIS — S88912A Complete traumatic amputation of left lower leg, level unspecified, initial encounter: Secondary | ICD-10-CM | POA: Diagnosis not present

## 2015-11-12 DIAGNOSIS — Z89511 Acquired absence of right leg below knee: Secondary | ICD-10-CM | POA: Diagnosis not present

## 2015-11-12 DIAGNOSIS — M8600 Acute hematogenous osteomyelitis, unspecified site: Secondary | ICD-10-CM | POA: Diagnosis not present

## 2015-11-12 DIAGNOSIS — E1165 Type 2 diabetes mellitus with hyperglycemia: Secondary | ICD-10-CM | POA: Diagnosis not present

## 2015-11-12 DIAGNOSIS — I129 Hypertensive chronic kidney disease with stage 1 through stage 4 chronic kidney disease, or unspecified chronic kidney disease: Secondary | ICD-10-CM | POA: Diagnosis not present

## 2015-11-12 DIAGNOSIS — R279 Unspecified lack of coordination: Secondary | ICD-10-CM | POA: Diagnosis not present

## 2015-11-12 DIAGNOSIS — R2681 Unsteadiness on feet: Secondary | ICD-10-CM | POA: Diagnosis not present

## 2015-11-12 DIAGNOSIS — I10 Essential (primary) hypertension: Secondary | ICD-10-CM | POA: Diagnosis not present

## 2015-11-12 DIAGNOSIS — L0390 Cellulitis, unspecified: Secondary | ICD-10-CM | POA: Diagnosis not present

## 2015-11-12 DIAGNOSIS — Z4889 Encounter for other specified surgical aftercare: Secondary | ICD-10-CM | POA: Diagnosis not present

## 2015-11-12 DIAGNOSIS — M869 Osteomyelitis, unspecified: Secondary | ICD-10-CM | POA: Diagnosis not present

## 2015-11-12 DIAGNOSIS — E119 Type 2 diabetes mellitus without complications: Secondary | ICD-10-CM | POA: Diagnosis not present

## 2015-11-12 DIAGNOSIS — M6281 Muscle weakness (generalized): Secondary | ICD-10-CM | POA: Diagnosis not present

## 2015-11-12 DIAGNOSIS — I2510 Atherosclerotic heart disease of native coronary artery without angina pectoris: Secondary | ICD-10-CM | POA: Diagnosis not present

## 2015-11-12 DIAGNOSIS — R262 Difficulty in walking, not elsewhere classified: Secondary | ICD-10-CM | POA: Diagnosis not present

## 2015-11-12 DIAGNOSIS — Z7401 Bed confinement status: Secondary | ICD-10-CM | POA: Diagnosis not present

## 2015-11-14 DIAGNOSIS — I10 Essential (primary) hypertension: Secondary | ICD-10-CM | POA: Diagnosis not present

## 2015-11-14 DIAGNOSIS — Z89511 Acquired absence of right leg below knee: Secondary | ICD-10-CM | POA: Diagnosis not present

## 2015-11-14 DIAGNOSIS — E119 Type 2 diabetes mellitus without complications: Secondary | ICD-10-CM | POA: Diagnosis not present

## 2015-11-28 DIAGNOSIS — M8600 Acute hematogenous osteomyelitis, unspecified site: Secondary | ICD-10-CM | POA: Diagnosis not present

## 2015-11-28 DIAGNOSIS — S88912A Complete traumatic amputation of left lower leg, level unspecified, initial encounter: Secondary | ICD-10-CM | POA: Diagnosis not present

## 2015-11-28 DIAGNOSIS — E119 Type 2 diabetes mellitus without complications: Secondary | ICD-10-CM | POA: Diagnosis not present

## 2015-12-04 DIAGNOSIS — Z79891 Long term (current) use of opiate analgesic: Secondary | ICD-10-CM | POA: Diagnosis not present

## 2015-12-04 DIAGNOSIS — S80911D Unspecified superficial injury of right knee, subsequent encounter: Secondary | ICD-10-CM | POA: Diagnosis not present

## 2015-12-04 DIAGNOSIS — N181 Chronic kidney disease, stage 1: Secondary | ICD-10-CM | POA: Diagnosis not present

## 2015-12-04 DIAGNOSIS — I129 Hypertensive chronic kidney disease with stage 1 through stage 4 chronic kidney disease, or unspecified chronic kidney disease: Secondary | ICD-10-CM | POA: Diagnosis not present

## 2015-12-04 DIAGNOSIS — Z89511 Acquired absence of right leg below knee: Secondary | ICD-10-CM | POA: Diagnosis not present

## 2015-12-04 DIAGNOSIS — E1151 Type 2 diabetes mellitus with diabetic peripheral angiopathy without gangrene: Secondary | ICD-10-CM | POA: Diagnosis not present

## 2015-12-04 DIAGNOSIS — E1165 Type 2 diabetes mellitus with hyperglycemia: Secondary | ICD-10-CM | POA: Diagnosis not present

## 2015-12-04 DIAGNOSIS — Z4781 Encounter for orthopedic aftercare following surgical amputation: Secondary | ICD-10-CM | POA: Diagnosis not present

## 2015-12-04 DIAGNOSIS — Z794 Long term (current) use of insulin: Secondary | ICD-10-CM | POA: Diagnosis not present

## 2015-12-04 DIAGNOSIS — E1122 Type 2 diabetes mellitus with diabetic chronic kidney disease: Secondary | ICD-10-CM | POA: Diagnosis not present

## 2015-12-04 DIAGNOSIS — I2510 Atherosclerotic heart disease of native coronary artery without angina pectoris: Secondary | ICD-10-CM | POA: Diagnosis not present

## 2015-12-04 DIAGNOSIS — F329 Major depressive disorder, single episode, unspecified: Secondary | ICD-10-CM | POA: Diagnosis not present

## 2015-12-05 DIAGNOSIS — Z4781 Encounter for orthopedic aftercare following surgical amputation: Secondary | ICD-10-CM | POA: Diagnosis not present

## 2015-12-05 DIAGNOSIS — E1165 Type 2 diabetes mellitus with hyperglycemia: Secondary | ICD-10-CM | POA: Diagnosis not present

## 2015-12-05 DIAGNOSIS — S80911D Unspecified superficial injury of right knee, subsequent encounter: Secondary | ICD-10-CM | POA: Diagnosis not present

## 2015-12-05 DIAGNOSIS — E1151 Type 2 diabetes mellitus with diabetic peripheral angiopathy without gangrene: Secondary | ICD-10-CM | POA: Diagnosis not present

## 2015-12-05 DIAGNOSIS — I2510 Atherosclerotic heart disease of native coronary artery without angina pectoris: Secondary | ICD-10-CM | POA: Diagnosis not present

## 2015-12-05 DIAGNOSIS — E1122 Type 2 diabetes mellitus with diabetic chronic kidney disease: Secondary | ICD-10-CM | POA: Diagnosis not present

## 2015-12-19 DIAGNOSIS — Z4781 Encounter for orthopedic aftercare following surgical amputation: Secondary | ICD-10-CM | POA: Diagnosis not present

## 2015-12-19 DIAGNOSIS — E1165 Type 2 diabetes mellitus with hyperglycemia: Secondary | ICD-10-CM | POA: Diagnosis not present

## 2015-12-19 DIAGNOSIS — E1151 Type 2 diabetes mellitus with diabetic peripheral angiopathy without gangrene: Secondary | ICD-10-CM | POA: Diagnosis not present

## 2015-12-19 DIAGNOSIS — E1122 Type 2 diabetes mellitus with diabetic chronic kidney disease: Secondary | ICD-10-CM | POA: Diagnosis not present

## 2015-12-19 DIAGNOSIS — S80911D Unspecified superficial injury of right knee, subsequent encounter: Secondary | ICD-10-CM | POA: Diagnosis not present

## 2015-12-19 DIAGNOSIS — I2510 Atherosclerotic heart disease of native coronary artery without angina pectoris: Secondary | ICD-10-CM | POA: Diagnosis not present

## 2015-12-20 DIAGNOSIS — E1122 Type 2 diabetes mellitus with diabetic chronic kidney disease: Secondary | ICD-10-CM | POA: Diagnosis not present

## 2015-12-20 DIAGNOSIS — I2510 Atherosclerotic heart disease of native coronary artery without angina pectoris: Secondary | ICD-10-CM | POA: Diagnosis not present

## 2015-12-20 DIAGNOSIS — S80911D Unspecified superficial injury of right knee, subsequent encounter: Secondary | ICD-10-CM | POA: Diagnosis not present

## 2015-12-20 DIAGNOSIS — E1151 Type 2 diabetes mellitus with diabetic peripheral angiopathy without gangrene: Secondary | ICD-10-CM | POA: Diagnosis not present

## 2015-12-20 DIAGNOSIS — Z4781 Encounter for orthopedic aftercare following surgical amputation: Secondary | ICD-10-CM | POA: Diagnosis not present

## 2015-12-20 DIAGNOSIS — E1165 Type 2 diabetes mellitus with hyperglycemia: Secondary | ICD-10-CM | POA: Diagnosis not present

## 2015-12-21 DIAGNOSIS — E1165 Type 2 diabetes mellitus with hyperglycemia: Secondary | ICD-10-CM | POA: Diagnosis not present

## 2015-12-21 DIAGNOSIS — E1122 Type 2 diabetes mellitus with diabetic chronic kidney disease: Secondary | ICD-10-CM | POA: Diagnosis not present

## 2015-12-21 DIAGNOSIS — E1151 Type 2 diabetes mellitus with diabetic peripheral angiopathy without gangrene: Secondary | ICD-10-CM | POA: Diagnosis not present

## 2015-12-21 DIAGNOSIS — Z4781 Encounter for orthopedic aftercare following surgical amputation: Secondary | ICD-10-CM | POA: Diagnosis not present

## 2015-12-21 DIAGNOSIS — S80911D Unspecified superficial injury of right knee, subsequent encounter: Secondary | ICD-10-CM | POA: Diagnosis not present

## 2015-12-21 DIAGNOSIS — I2510 Atherosclerotic heart disease of native coronary artery without angina pectoris: Secondary | ICD-10-CM | POA: Diagnosis not present

## 2015-12-25 DIAGNOSIS — E1165 Type 2 diabetes mellitus with hyperglycemia: Secondary | ICD-10-CM | POA: Diagnosis not present

## 2015-12-25 DIAGNOSIS — Z4781 Encounter for orthopedic aftercare following surgical amputation: Secondary | ICD-10-CM | POA: Diagnosis not present

## 2015-12-25 DIAGNOSIS — E1151 Type 2 diabetes mellitus with diabetic peripheral angiopathy without gangrene: Secondary | ICD-10-CM | POA: Diagnosis not present

## 2015-12-25 DIAGNOSIS — I2510 Atherosclerotic heart disease of native coronary artery without angina pectoris: Secondary | ICD-10-CM | POA: Diagnosis not present

## 2015-12-25 DIAGNOSIS — E1122 Type 2 diabetes mellitus with diabetic chronic kidney disease: Secondary | ICD-10-CM | POA: Diagnosis not present

## 2015-12-25 DIAGNOSIS — S80911D Unspecified superficial injury of right knee, subsequent encounter: Secondary | ICD-10-CM | POA: Diagnosis not present

## 2016-01-04 DIAGNOSIS — I10 Essential (primary) hypertension: Secondary | ICD-10-CM | POA: Diagnosis not present

## 2016-01-04 DIAGNOSIS — M25512 Pain in left shoulder: Secondary | ICD-10-CM | POA: Diagnosis not present

## 2016-01-04 DIAGNOSIS — E7800 Pure hypercholesterolemia, unspecified: Secondary | ICD-10-CM | POA: Diagnosis not present

## 2016-01-04 DIAGNOSIS — Z89511 Acquired absence of right leg below knee: Secondary | ICD-10-CM | POA: Diagnosis not present

## 2016-01-04 DIAGNOSIS — M79604 Pain in right leg: Secondary | ICD-10-CM | POA: Diagnosis not present

## 2016-01-04 DIAGNOSIS — E1149 Type 2 diabetes mellitus with other diabetic neurological complication: Secondary | ICD-10-CM | POA: Diagnosis not present

## 2016-02-21 DIAGNOSIS — M79661 Pain in right lower leg: Secondary | ICD-10-CM | POA: Diagnosis not present

## 2016-03-06 DIAGNOSIS — E161 Other hypoglycemia: Secondary | ICD-10-CM | POA: Diagnosis not present

## 2016-03-07 DIAGNOSIS — E161 Other hypoglycemia: Secondary | ICD-10-CM | POA: Diagnosis not present

## 2016-03-07 DIAGNOSIS — E11649 Type 2 diabetes mellitus with hypoglycemia without coma: Secondary | ICD-10-CM | POA: Diagnosis not present

## 2016-04-03 DIAGNOSIS — F329 Major depressive disorder, single episode, unspecified: Secondary | ICD-10-CM | POA: Diagnosis not present

## 2016-04-03 DIAGNOSIS — E663 Overweight: Secondary | ICD-10-CM | POA: Diagnosis not present

## 2016-04-03 DIAGNOSIS — M79604 Pain in right leg: Secondary | ICD-10-CM | POA: Diagnosis not present

## 2016-04-03 DIAGNOSIS — Z79899 Other long term (current) drug therapy: Secondary | ICD-10-CM | POA: Diagnosis not present

## 2016-04-03 DIAGNOSIS — E1149 Type 2 diabetes mellitus with other diabetic neurological complication: Secondary | ICD-10-CM | POA: Diagnosis not present

## 2016-04-03 DIAGNOSIS — I10 Essential (primary) hypertension: Secondary | ICD-10-CM | POA: Diagnosis not present

## 2016-04-03 DIAGNOSIS — Z6826 Body mass index (BMI) 26.0-26.9, adult: Secondary | ICD-10-CM | POA: Diagnosis not present

## 2016-04-03 DIAGNOSIS — M25512 Pain in left shoulder: Secondary | ICD-10-CM | POA: Diagnosis not present

## 2016-04-03 DIAGNOSIS — Z1389 Encounter for screening for other disorder: Secondary | ICD-10-CM | POA: Diagnosis not present

## 2016-04-14 DIAGNOSIS — Z89511 Acquired absence of right leg below knee: Secondary | ICD-10-CM | POA: Diagnosis not present

## 2016-04-14 DIAGNOSIS — R2689 Other abnormalities of gait and mobility: Secondary | ICD-10-CM | POA: Diagnosis not present

## 2016-04-14 DIAGNOSIS — M6281 Muscle weakness (generalized): Secondary | ICD-10-CM | POA: Diagnosis not present

## 2016-07-01 DIAGNOSIS — Z89511 Acquired absence of right leg below knee: Secondary | ICD-10-CM | POA: Diagnosis not present

## 2016-07-01 DIAGNOSIS — M869 Osteomyelitis, unspecified: Secondary | ICD-10-CM | POA: Diagnosis not present

## 2016-07-01 DIAGNOSIS — M6281 Muscle weakness (generalized): Secondary | ICD-10-CM | POA: Diagnosis not present

## 2016-07-01 DIAGNOSIS — R262 Difficulty in walking, not elsewhere classified: Secondary | ICD-10-CM | POA: Diagnosis not present

## 2016-07-01 DIAGNOSIS — I2510 Atherosclerotic heart disease of native coronary artery without angina pectoris: Secondary | ICD-10-CM | POA: Diagnosis not present

## 2016-07-01 DIAGNOSIS — N181 Chronic kidney disease, stage 1: Secondary | ICD-10-CM | POA: Diagnosis not present

## 2016-07-08 DIAGNOSIS — E161 Other hypoglycemia: Secondary | ICD-10-CM | POA: Diagnosis not present

## 2016-07-08 DIAGNOSIS — E162 Hypoglycemia, unspecified: Secondary | ICD-10-CM | POA: Diagnosis not present

## 2016-07-10 DIAGNOSIS — Z89511 Acquired absence of right leg below knee: Secondary | ICD-10-CM | POA: Diagnosis not present

## 2016-07-10 DIAGNOSIS — M25512 Pain in left shoulder: Secondary | ICD-10-CM | POA: Diagnosis not present

## 2016-07-10 DIAGNOSIS — E7800 Pure hypercholesterolemia, unspecified: Secondary | ICD-10-CM | POA: Diagnosis not present

## 2016-07-10 DIAGNOSIS — M79604 Pain in right leg: Secondary | ICD-10-CM | POA: Diagnosis not present

## 2016-07-10 DIAGNOSIS — I10 Essential (primary) hypertension: Secondary | ICD-10-CM | POA: Diagnosis not present

## 2016-07-10 DIAGNOSIS — E1149 Type 2 diabetes mellitus with other diabetic neurological complication: Secondary | ICD-10-CM | POA: Diagnosis not present

## 2016-08-01 DIAGNOSIS — M869 Osteomyelitis, unspecified: Secondary | ICD-10-CM | POA: Diagnosis not present

## 2016-08-01 DIAGNOSIS — M6281 Muscle weakness (generalized): Secondary | ICD-10-CM | POA: Diagnosis not present

## 2016-08-01 DIAGNOSIS — Z89511 Acquired absence of right leg below knee: Secondary | ICD-10-CM | POA: Diagnosis not present

## 2016-08-01 DIAGNOSIS — R262 Difficulty in walking, not elsewhere classified: Secondary | ICD-10-CM | POA: Diagnosis not present

## 2016-08-01 DIAGNOSIS — N181 Chronic kidney disease, stage 1: Secondary | ICD-10-CM | POA: Diagnosis not present

## 2016-08-01 DIAGNOSIS — I2510 Atherosclerotic heart disease of native coronary artery without angina pectoris: Secondary | ICD-10-CM | POA: Diagnosis not present

## 2016-09-22 DIAGNOSIS — Z6830 Body mass index (BMI) 30.0-30.9, adult: Secondary | ICD-10-CM | POA: Diagnosis not present

## 2016-09-22 DIAGNOSIS — E663 Overweight: Secondary | ICD-10-CM | POA: Diagnosis not present

## 2016-09-22 DIAGNOSIS — L02415 Cutaneous abscess of right lower limb: Secondary | ICD-10-CM | POA: Diagnosis not present

## 2016-09-23 DIAGNOSIS — L02415 Cutaneous abscess of right lower limb: Secondary | ICD-10-CM | POA: Diagnosis not present

## 2016-10-09 DIAGNOSIS — I10 Essential (primary) hypertension: Secondary | ICD-10-CM | POA: Diagnosis not present

## 2016-10-09 DIAGNOSIS — E1149 Type 2 diabetes mellitus with other diabetic neurological complication: Secondary | ICD-10-CM | POA: Diagnosis not present

## 2016-10-09 DIAGNOSIS — Z89511 Acquired absence of right leg below knee: Secondary | ICD-10-CM | POA: Diagnosis not present

## 2016-10-09 DIAGNOSIS — L02415 Cutaneous abscess of right lower limb: Secondary | ICD-10-CM | POA: Diagnosis not present

## 2016-10-09 DIAGNOSIS — Z79899 Other long term (current) drug therapy: Secondary | ICD-10-CM | POA: Diagnosis not present

## 2016-10-23 DIAGNOSIS — Z89511 Acquired absence of right leg below knee: Secondary | ICD-10-CM | POA: Diagnosis not present

## 2016-10-23 DIAGNOSIS — E1149 Type 2 diabetes mellitus with other diabetic neurological complication: Secondary | ICD-10-CM | POA: Diagnosis not present

## 2016-10-23 DIAGNOSIS — L02415 Cutaneous abscess of right lower limb: Secondary | ICD-10-CM | POA: Diagnosis not present

## 2016-11-13 DIAGNOSIS — L02415 Cutaneous abscess of right lower limb: Secondary | ICD-10-CM | POA: Diagnosis not present

## 2016-11-13 DIAGNOSIS — M25512 Pain in left shoulder: Secondary | ICD-10-CM | POA: Diagnosis not present

## 2016-11-13 DIAGNOSIS — Z6830 Body mass index (BMI) 30.0-30.9, adult: Secondary | ICD-10-CM | POA: Diagnosis not present

## 2017-01-13 DIAGNOSIS — M25512 Pain in left shoulder: Secondary | ICD-10-CM | POA: Diagnosis not present

## 2017-01-13 DIAGNOSIS — Z89511 Acquired absence of right leg below knee: Secondary | ICD-10-CM | POA: Diagnosis not present

## 2017-01-13 DIAGNOSIS — Z125 Encounter for screening for malignant neoplasm of prostate: Secondary | ICD-10-CM | POA: Diagnosis not present

## 2017-01-13 DIAGNOSIS — G546 Phantom limb syndrome with pain: Secondary | ICD-10-CM | POA: Diagnosis not present

## 2017-01-13 DIAGNOSIS — E782 Mixed hyperlipidemia: Secondary | ICD-10-CM | POA: Diagnosis not present

## 2017-01-13 DIAGNOSIS — E1149 Type 2 diabetes mellitus with other diabetic neurological complication: Secondary | ICD-10-CM | POA: Diagnosis not present

## 2017-01-13 DIAGNOSIS — I10 Essential (primary) hypertension: Secondary | ICD-10-CM | POA: Diagnosis not present

## 2017-04-13 DIAGNOSIS — M25512 Pain in left shoulder: Secondary | ICD-10-CM | POA: Diagnosis not present

## 2017-04-13 DIAGNOSIS — Z1389 Encounter for screening for other disorder: Secondary | ICD-10-CM | POA: Diagnosis not present

## 2017-04-13 DIAGNOSIS — E1142 Type 2 diabetes mellitus with diabetic polyneuropathy: Secondary | ICD-10-CM | POA: Diagnosis not present

## 2017-04-13 DIAGNOSIS — Z6830 Body mass index (BMI) 30.0-30.9, adult: Secondary | ICD-10-CM | POA: Diagnosis not present

## 2017-04-13 DIAGNOSIS — E1149 Type 2 diabetes mellitus with other diabetic neurological complication: Secondary | ICD-10-CM | POA: Diagnosis not present

## 2017-04-13 DIAGNOSIS — Z89511 Acquired absence of right leg below knee: Secondary | ICD-10-CM | POA: Diagnosis not present

## 2017-04-13 DIAGNOSIS — Z79899 Other long term (current) drug therapy: Secondary | ICD-10-CM | POA: Diagnosis not present

## 2017-04-13 DIAGNOSIS — E7800 Pure hypercholesterolemia, unspecified: Secondary | ICD-10-CM | POA: Diagnosis not present

## 2017-04-13 DIAGNOSIS — I10 Essential (primary) hypertension: Secondary | ICD-10-CM | POA: Diagnosis not present

## 2017-04-22 DIAGNOSIS — Z79899 Other long term (current) drug therapy: Secondary | ICD-10-CM | POA: Diagnosis not present

## 2017-07-20 DIAGNOSIS — Z6830 Body mass index (BMI) 30.0-30.9, adult: Secondary | ICD-10-CM | POA: Diagnosis not present

## 2017-07-20 DIAGNOSIS — F329 Major depressive disorder, single episode, unspecified: Secondary | ICD-10-CM | POA: Diagnosis not present

## 2017-07-20 DIAGNOSIS — I10 Essential (primary) hypertension: Secondary | ICD-10-CM | POA: Diagnosis not present

## 2017-07-20 DIAGNOSIS — E1149 Type 2 diabetes mellitus with other diabetic neurological complication: Secondary | ICD-10-CM | POA: Diagnosis not present

## 2017-07-20 DIAGNOSIS — E7800 Pure hypercholesterolemia, unspecified: Secondary | ICD-10-CM | POA: Diagnosis not present

## 2017-07-20 DIAGNOSIS — Z89511 Acquired absence of right leg below knee: Secondary | ICD-10-CM | POA: Diagnosis not present

## 2017-10-20 DIAGNOSIS — F329 Major depressive disorder, single episode, unspecified: Secondary | ICD-10-CM | POA: Diagnosis not present

## 2017-10-20 DIAGNOSIS — E7800 Pure hypercholesterolemia, unspecified: Secondary | ICD-10-CM | POA: Diagnosis not present

## 2017-10-20 DIAGNOSIS — N529 Male erectile dysfunction, unspecified: Secondary | ICD-10-CM | POA: Diagnosis not present

## 2017-10-20 DIAGNOSIS — E1149 Type 2 diabetes mellitus with other diabetic neurological complication: Secondary | ICD-10-CM | POA: Diagnosis not present

## 2017-12-28 DIAGNOSIS — E119 Type 2 diabetes mellitus without complications: Secondary | ICD-10-CM | POA: Diagnosis not present

## 2017-12-28 DIAGNOSIS — Z6830 Body mass index (BMI) 30.0-30.9, adult: Secondary | ICD-10-CM | POA: Diagnosis not present

## 2017-12-28 DIAGNOSIS — E1159 Type 2 diabetes mellitus with other circulatory complications: Secondary | ICD-10-CM | POA: Diagnosis not present

## 2017-12-28 DIAGNOSIS — L03114 Cellulitis of left upper limb: Secondary | ICD-10-CM | POA: Diagnosis not present

## 2017-12-28 DIAGNOSIS — M79644 Pain in right finger(s): Secondary | ICD-10-CM | POA: Diagnosis not present

## 2017-12-28 DIAGNOSIS — L02512 Cutaneous abscess of left hand: Secondary | ICD-10-CM | POA: Diagnosis not present

## 2017-12-28 DIAGNOSIS — M79642 Pain in left hand: Secondary | ICD-10-CM | POA: Diagnosis not present

## 2017-12-28 DIAGNOSIS — R000 Tachycardia, unspecified: Secondary | ICD-10-CM | POA: Diagnosis not present

## 2017-12-28 DIAGNOSIS — I7090 Unspecified atherosclerosis: Secondary | ICD-10-CM | POA: Diagnosis not present

## 2017-12-28 DIAGNOSIS — Z23 Encounter for immunization: Secondary | ICD-10-CM | POA: Diagnosis not present

## 2017-12-28 DIAGNOSIS — M7989 Other specified soft tissue disorders: Secondary | ICD-10-CM | POA: Diagnosis not present

## 2017-12-28 DIAGNOSIS — S6992XA Unspecified injury of left wrist, hand and finger(s), initial encounter: Secondary | ICD-10-CM | POA: Diagnosis not present

## 2017-12-28 DIAGNOSIS — I10 Essential (primary) hypertension: Secondary | ICD-10-CM | POA: Diagnosis not present

## 2017-12-28 DIAGNOSIS — Z89511 Acquired absence of right leg below knee: Secondary | ICD-10-CM | POA: Diagnosis not present

## 2018-01-04 DIAGNOSIS — L02512 Cutaneous abscess of left hand: Secondary | ICD-10-CM | POA: Diagnosis not present

## 2018-01-04 DIAGNOSIS — Z6829 Body mass index (BMI) 29.0-29.9, adult: Secondary | ICD-10-CM | POA: Diagnosis not present

## 2018-01-04 DIAGNOSIS — E1159 Type 2 diabetes mellitus with other circulatory complications: Secondary | ICD-10-CM | POA: Diagnosis not present

## 2018-01-04 DIAGNOSIS — E1149 Type 2 diabetes mellitus with other diabetic neurological complication: Secondary | ICD-10-CM | POA: Diagnosis not present

## 2018-01-08 DIAGNOSIS — Z6829 Body mass index (BMI) 29.0-29.9, adult: Secondary | ICD-10-CM | POA: Diagnosis not present

## 2018-01-08 DIAGNOSIS — E1149 Type 2 diabetes mellitus with other diabetic neurological complication: Secondary | ICD-10-CM | POA: Diagnosis not present

## 2018-01-08 DIAGNOSIS — L02512 Cutaneous abscess of left hand: Secondary | ICD-10-CM | POA: Diagnosis not present

## 2018-01-14 DIAGNOSIS — Z9181 History of falling: Secondary | ICD-10-CM | POA: Diagnosis not present

## 2018-01-14 DIAGNOSIS — Z6829 Body mass index (BMI) 29.0-29.9, adult: Secondary | ICD-10-CM | POA: Diagnosis not present

## 2018-01-14 DIAGNOSIS — L02512 Cutaneous abscess of left hand: Secondary | ICD-10-CM | POA: Diagnosis not present

## 2018-01-21 DIAGNOSIS — E1149 Type 2 diabetes mellitus with other diabetic neurological complication: Secondary | ICD-10-CM | POA: Diagnosis not present

## 2018-01-21 DIAGNOSIS — E7800 Pure hypercholesterolemia, unspecified: Secondary | ICD-10-CM | POA: Diagnosis not present

## 2018-01-21 DIAGNOSIS — F329 Major depressive disorder, single episode, unspecified: Secondary | ICD-10-CM | POA: Diagnosis not present

## 2018-01-21 DIAGNOSIS — Z6829 Body mass index (BMI) 29.0-29.9, adult: Secondary | ICD-10-CM | POA: Diagnosis not present

## 2018-01-21 DIAGNOSIS — L02512 Cutaneous abscess of left hand: Secondary | ICD-10-CM | POA: Diagnosis not present

## 2018-01-21 DIAGNOSIS — Z79899 Other long term (current) drug therapy: Secondary | ICD-10-CM | POA: Diagnosis not present

## 2018-01-26 DIAGNOSIS — L02512 Cutaneous abscess of left hand: Secondary | ICD-10-CM | POA: Diagnosis not present

## 2018-01-26 DIAGNOSIS — M7989 Other specified soft tissue disorders: Secondary | ICD-10-CM | POA: Diagnosis not present

## 2018-01-26 DIAGNOSIS — M009 Pyogenic arthritis, unspecified: Secondary | ICD-10-CM | POA: Diagnosis not present

## 2018-01-26 DIAGNOSIS — M869 Osteomyelitis, unspecified: Secondary | ICD-10-CM | POA: Diagnosis not present

## 2018-01-28 DIAGNOSIS — M869 Osteomyelitis, unspecified: Secondary | ICD-10-CM | POA: Diagnosis not present

## 2018-01-28 DIAGNOSIS — L02512 Cutaneous abscess of left hand: Secondary | ICD-10-CM | POA: Diagnosis not present

## 2018-02-01 ENCOUNTER — Other Ambulatory Visit: Payer: Self-pay | Admitting: Orthopedic Surgery

## 2018-02-01 DIAGNOSIS — M79645 Pain in left finger(s): Secondary | ICD-10-CM | POA: Diagnosis not present

## 2018-02-01 DIAGNOSIS — M86442 Chronic osteomyelitis with draining sinus, left hand: Secondary | ICD-10-CM | POA: Diagnosis not present

## 2018-02-02 ENCOUNTER — Encounter (HOSPITAL_BASED_OUTPATIENT_CLINIC_OR_DEPARTMENT_OTHER): Payer: Self-pay | Admitting: *Deleted

## 2018-02-02 NOTE — Pre-Procedure Instructions (Signed)
Unable to reach pt by phone, Left detailed message on answering machine: NPO after midnight, not to take any meds for his diabetes in the am, to arrive by 8am, to make sure he has an adult with him to take him home and stay with him him overnight etc.

## 2018-02-03 ENCOUNTER — Encounter (HOSPITAL_BASED_OUTPATIENT_CLINIC_OR_DEPARTMENT_OTHER)
Admission: RE | Disposition: A | Discharge status: Home / Self Care | Payer: Self-pay | Source: Ambulatory Visit | Attending: Orthopedic Surgery

## 2018-02-03 ENCOUNTER — Other Ambulatory Visit: Payer: Self-pay

## 2018-02-03 ENCOUNTER — Ambulatory Visit (HOSPITAL_BASED_OUTPATIENT_CLINIC_OR_DEPARTMENT_OTHER)
Admission: RE | Admit: 2018-02-03 | Discharge: 2018-02-03 | Disposition: A | Discharge status: Home / Self Care | Payer: Medicare HMO | Source: Ambulatory Visit | Attending: Orthopedic Surgery | Admitting: Orthopedic Surgery

## 2018-02-03 ENCOUNTER — Encounter (HOSPITAL_BASED_OUTPATIENT_CLINIC_OR_DEPARTMENT_OTHER): Payer: Self-pay

## 2018-02-03 ENCOUNTER — Ambulatory Visit (HOSPITAL_BASED_OUTPATIENT_CLINIC_OR_DEPARTMENT_OTHER): Payer: Medicare HMO | Admitting: Anesthesiology

## 2018-02-03 DIAGNOSIS — E7800 Pure hypercholesterolemia, unspecified: Secondary | ICD-10-CM | POA: Insufficient documentation

## 2018-02-03 DIAGNOSIS — M86441 Chronic osteomyelitis with draining sinus, right hand: Secondary | ICD-10-CM | POA: Diagnosis not present

## 2018-02-03 DIAGNOSIS — M009 Pyogenic arthritis, unspecified: Secondary | ICD-10-CM | POA: Diagnosis not present

## 2018-02-03 DIAGNOSIS — M00842 Arthritis due to other bacteria, left hand: Secondary | ICD-10-CM | POA: Diagnosis not present

## 2018-02-03 DIAGNOSIS — B9689 Other specified bacterial agents as the cause of diseases classified elsewhere: Secondary | ICD-10-CM | POA: Insufficient documentation

## 2018-02-03 DIAGNOSIS — Z9049 Acquired absence of other specified parts of digestive tract: Secondary | ICD-10-CM | POA: Diagnosis not present

## 2018-02-03 DIAGNOSIS — E1169 Type 2 diabetes mellitus with other specified complication: Secondary | ICD-10-CM | POA: Insufficient documentation

## 2018-02-03 DIAGNOSIS — Z79899 Other long term (current) drug therapy: Secondary | ICD-10-CM | POA: Insufficient documentation

## 2018-02-03 DIAGNOSIS — B952 Enterococcus as the cause of diseases classified elsewhere: Secondary | ICD-10-CM | POA: Insufficient documentation

## 2018-02-03 DIAGNOSIS — Z7984 Long term (current) use of oral hypoglycemic drugs: Secondary | ICD-10-CM | POA: Diagnosis not present

## 2018-02-03 DIAGNOSIS — L089 Local infection of the skin and subcutaneous tissue, unspecified: Secondary | ICD-10-CM | POA: Diagnosis present

## 2018-02-03 HISTORY — DX: Pure hypercholesterolemia, unspecified: E78.00

## 2018-02-03 HISTORY — DX: Type 2 diabetes mellitus without complications: E11.9

## 2018-02-03 HISTORY — PX: INCISION AND DRAINAGE ABSCESS: SHX5864

## 2018-02-03 LAB — POCT I-STAT, CHEM 8
BUN: 20 mg/dL (ref 6–20)
Calcium, Ion: 1.06 mmol/L — ABNORMAL LOW (ref 1.15–1.40)
Chloride: 102 mmol/L (ref 101–111)
Creatinine, Ser: 1.1 mg/dL (ref 0.61–1.24)
Glucose, Bld: 123 mg/dL — ABNORMAL HIGH (ref 65–99)
HCT: 43 % (ref 39.0–52.0)
Hemoglobin: 14.6 g/dL (ref 13.0–17.0)
Potassium: 5.3 mmol/L — ABNORMAL HIGH (ref 3.5–5.1)
Sodium: 136 mmol/L (ref 135–145)
TCO2: 28 mmol/L (ref 22–32)

## 2018-02-03 LAB — GLUCOSE, CAPILLARY: Glucose-Capillary: 117 mg/dL — ABNORMAL HIGH (ref 65–99)

## 2018-02-03 SURGERY — INCISION AND DRAINAGE, ABSCESS
Anesthesia: General | Site: Hand | Laterality: Left

## 2018-02-03 MED ORDER — OXYCODONE HCL 5 MG/5ML PO SOLN: 5.0000 mg | Freq: Once | ORAL | Status: DC | PRN

## 2018-02-03 MED ORDER — ONDANSETRON HCL 4 MG/2ML IJ SOLN
INTRAMUSCULAR | Status: DC | PRN
  Administered 2018-02-03: 4 mg via INTRAVENOUS

## 2018-02-03 MED ORDER — FENTANYL CITRATE (PF) 100 MCG/2ML IJ SOLN
50.0000 ug | INTRAMUSCULAR | Status: DC | PRN
  Administered 2018-02-03 (×2): 50 ug via INTRAVENOUS

## 2018-02-03 MED ORDER — PHENYLEPHRINE HCL 10 MG/ML IJ SOLN
INTRAMUSCULAR | Status: DC | PRN
  Administered 2018-02-03 (×2): 80 ug via INTRAVENOUS

## 2018-02-03 MED ORDER — FENTANYL CITRATE (PF) 100 MCG/2ML IJ SOLN
INTRAMUSCULAR | Status: AC
  Filled 2018-02-03: qty 2

## 2018-02-03 MED ORDER — LACTATED RINGERS IV SOLN
INTRAVENOUS | Status: DC
  Administered 2018-02-03: 08:00:00 via INTRAVENOUS

## 2018-02-03 MED ORDER — ONDANSETRON HCL 4 MG/2ML IJ SOLN
INTRAMUSCULAR | Status: AC
  Filled 2018-02-03: qty 2

## 2018-02-03 MED ORDER — CHLORHEXIDINE GLUCONATE 4 % EX LIQD: 60.0000 mL | Freq: Once | CUTANEOUS | Status: DC

## 2018-02-03 MED ORDER — LIDOCAINE 2% (20 MG/ML) 5 ML SYRINGE
INTRAMUSCULAR | Status: DC | PRN
  Administered 2018-02-03: 60 mg via INTRAVENOUS

## 2018-02-03 MED ORDER — PROPOFOL 10 MG/ML IV BOLUS
INTRAVENOUS | Status: DC | PRN
  Administered 2018-02-03: 150 mg via INTRAVENOUS

## 2018-02-03 MED ORDER — CEFAZOLIN SODIUM-DEXTROSE 2-4 GM/100ML-% IV SOLN
INTRAVENOUS | Status: AC
  Filled 2018-02-03: qty 100

## 2018-02-03 MED ORDER — BUPIVACAINE HCL (PF) 0.25 % IJ SOLN
INTRAMUSCULAR | Status: AC
  Filled 2018-02-03: qty 30

## 2018-02-03 MED ORDER — MIDAZOLAM HCL 2 MG/2ML IJ SOLN
INTRAMUSCULAR | Status: AC
  Filled 2018-02-03: qty 2

## 2018-02-03 MED ORDER — LIDOCAINE 2% (20 MG/ML) 5 ML SYRINGE
INTRAMUSCULAR | Status: AC
  Filled 2018-02-03: qty 5

## 2018-02-03 MED ORDER — PROPOFOL 500 MG/50ML IV EMUL
INTRAVENOUS | Status: AC
  Filled 2018-02-03: qty 50

## 2018-02-03 MED ORDER — SCOPOLAMINE 1 MG/3DAYS TD PT72: 1.0000 | MEDICATED_PATCH | Freq: Once | TRANSDERMAL | Status: DC | PRN

## 2018-02-03 MED ORDER — MIDAZOLAM HCL 2 MG/2ML IJ SOLN
1.0000 mg | INTRAMUSCULAR | Status: DC | PRN
  Administered 2018-02-03: 2 mg via INTRAVENOUS

## 2018-02-03 MED ORDER — FENTANYL CITRATE (PF) 100 MCG/2ML IJ SOLN
25.0000 ug | INTRAMUSCULAR | Status: DC | PRN
  Administered 2018-02-03 (×2): 50 ug via INTRAVENOUS

## 2018-02-03 MED ORDER — CEFAZOLIN SODIUM-DEXTROSE 2-3 GM-%(50ML) IV SOLR
INTRAVENOUS | Status: DC | PRN
  Administered 2018-02-03: 2 g via INTRAVENOUS

## 2018-02-03 MED ORDER — DEXAMETHASONE SODIUM PHOSPHATE 10 MG/ML IJ SOLN
INTRAMUSCULAR | Status: AC
  Filled 2018-02-03: qty 1

## 2018-02-03 MED ORDER — DEXAMETHASONE SODIUM PHOSPHATE 10 MG/ML IJ SOLN
INTRAMUSCULAR | Status: DC | PRN
  Administered 2018-02-03: 4 mg via INTRAVENOUS

## 2018-02-03 MED ORDER — PROMETHAZINE HCL 25 MG/ML IJ SOLN: 6.2500 mg | INTRAMUSCULAR | Status: DC | PRN

## 2018-02-03 MED ORDER — OXYCODONE HCL 5 MG PO TABS: 5.0000 mg | ORAL_TABLET | Freq: Once | ORAL | Status: DC | PRN

## 2018-02-03 SURGICAL SUPPLY — 67 items
APL SKNCLS STERI-STRIP NONHPOA (GAUZE/BANDAGES/DRESSINGS)
BAG DECANTER FOR FLEXI CONT (MISCELLANEOUS) IMPLANT
BANDAGE ACE 3X5.8 VEL STRL LF (GAUZE/BANDAGES/DRESSINGS) IMPLANT
BANDAGE ACE 4X5 VEL STRL LF (GAUZE/BANDAGES/DRESSINGS) IMPLANT
BENZOIN TINCTURE PRP APPL 2/3 (GAUZE/BANDAGES/DRESSINGS) IMPLANT
BLADE SURG 15 STRL LF DISP TIS (BLADE) ×1 IMPLANT
BLADE SURG 15 STRL SS (BLADE) ×3
BNDG CMPR 9X4 STRL LF SNTH (GAUZE/BANDAGES/DRESSINGS) ×1
BNDG COHESIVE 1X5 TAN STRL LF (GAUZE/BANDAGES/DRESSINGS) ×3 IMPLANT
BNDG ESMARK 4X9 LF (GAUZE/BANDAGES/DRESSINGS) ×3 IMPLANT
BNDG GAUZE ELAST 4 BULKY (GAUZE/BANDAGES/DRESSINGS) IMPLANT
CLOSURE WOUND 1/2 X4 (GAUZE/BANDAGES/DRESSINGS)
CORD BIPOLAR FORCEPS 12FT (ELECTRODE) ×3 IMPLANT
COVER BACK TABLE 60X90IN (DRAPES) ×3 IMPLANT
CUFF TOURNIQUET SINGLE 18IN (TOURNIQUET CUFF) ×3 IMPLANT
DECANTER SPIKE VIAL GLASS SM (MISCELLANEOUS) IMPLANT
DRAPE EXTREMITY T 121X128X90 (DRAPE) ×3 IMPLANT
DRAPE SURG 17X23 STRL (DRAPES) ×3 IMPLANT
DURAPREP 26ML APPLICATOR (WOUND CARE) ×3 IMPLANT
GAUZE PACKING IODOFORM 1/4X15 (GAUZE/BANDAGES/DRESSINGS) ×3 IMPLANT
GAUZE SPONGE 4X4 12PLY STRL (GAUZE/BANDAGES/DRESSINGS) ×3 IMPLANT
GAUZE XEROFORM 1X8 LF (GAUZE/BANDAGES/DRESSINGS) IMPLANT
GLOVE BIO SURGEON STRL SZ 6.5 (GLOVE) ×2 IMPLANT
GLOVE BIO SURGEONS STRL SZ 6.5 (GLOVE) ×1
GLOVE BIOGEL PI IND STRL 7.0 (GLOVE) ×2 IMPLANT
GLOVE BIOGEL PI INDICATOR 7.0 (GLOVE) ×4
GLOVE SURG SYN 8.0 (GLOVE) ×6 IMPLANT
GOWN STRL REIN XL XLG (GOWN DISPOSABLE) ×3 IMPLANT
GOWN STRL REUS W/ TWL LRG LVL3 (GOWN DISPOSABLE) ×1 IMPLANT
GOWN STRL REUS W/TWL LRG LVL3 (GOWN DISPOSABLE) ×3
HANDPIECE INTERPULSE COAX TIP (DISPOSABLE)
LOOP VESSEL MAXI BLUE (MISCELLANEOUS) IMPLANT
NEEDLE HYPO 25X1 1.5 SAFETY (NEEDLE) ×3 IMPLANT
NEEDLE PRECISIONGLIDE 27X1.5 (NEEDLE) IMPLANT
NS IRRIG 1000ML POUR BTL (IV SOLUTION) ×3 IMPLANT
PACK BASIN DAY SURGERY FS (CUSTOM PROCEDURE TRAY) ×3 IMPLANT
PAD CAST 3X4 CTTN HI CHSV (CAST SUPPLIES) ×1 IMPLANT
PADDING CAST ABS 3INX4YD NS (CAST SUPPLIES) ×2
PADDING CAST ABS 4INX4YD NS (CAST SUPPLIES)
PADDING CAST ABS COTTON 3X4 (CAST SUPPLIES) ×1 IMPLANT
PADDING CAST ABS COTTON 4X4 ST (CAST SUPPLIES) IMPLANT
PADDING CAST COTTON 3X4 STRL (CAST SUPPLIES) ×3
SET HNDPC FAN SPRY TIP SCT (DISPOSABLE) IMPLANT
SHEET MEDIUM DRAPE 40X70 STRL (DRAPES) ×3 IMPLANT
SPLINT PLASTER CAST XFAST 3X15 (CAST SUPPLIES) IMPLANT
SPLINT PLASTER CAST XFAST 4X15 (CAST SUPPLIES) IMPLANT
SPLINT PLASTER XTRA FAST SET 4 (CAST SUPPLIES)
SPLINT PLASTER XTRA FASTSET 3X (CAST SUPPLIES)
STOCKINETTE 4X48 STRL (DRAPES) ×3 IMPLANT
STRIP CLOSURE SKIN 1/2X4 (GAUZE/BANDAGES/DRESSINGS) IMPLANT
SUT ETHILON 3 0 PS 1 (SUTURE) IMPLANT
SUT ETHILON 5 0 PS 2 18 (SUTURE) IMPLANT
SUT PROLENE 3 0 PS 2 (SUTURE) IMPLANT
SUT VIC AB 4-0 P-3 18XBRD (SUTURE) IMPLANT
SUT VIC AB 4-0 P3 18 (SUTURE)
SUT VICRYL RAPIDE 4-0 (SUTURE) IMPLANT
SUT VICRYL RAPIDE 4/0 PS 2 (SUTURE) IMPLANT
SWAB COLLECTION DEVICE MRSA (MISCELLANEOUS) ×6 IMPLANT
SWAB CULTURE ESWAB REG 1ML (MISCELLANEOUS) ×6 IMPLANT
SYR 10ML LL (SYRINGE) ×3 IMPLANT
SYR BULB 3OZ (MISCELLANEOUS) ×3 IMPLANT
SYR CONTROL 10ML LL (SYRINGE) IMPLANT
TOWEL OR 17X24 6PK STRL BLUE (TOWEL DISPOSABLE) ×3 IMPLANT
TUBE CONNECTING 20'X1/4 (TUBING)
TUBE CONNECTING 20X1/4 (TUBING) IMPLANT
UNDERPAD 30X30 (UNDERPADS AND DIAPERS) ×3 IMPLANT
YANKAUER SUCT BULB TIP NO VENT (SUCTIONS) IMPLANT

## 2018-02-03 NOTE — Transfer of Care (Signed)
Immediate Anesthesia Transfer of Care Note  Patient: Shawn [name]  Procedure(s) Performed: INCISION AND DRAINAGE LEFT THUMB INFECTION (Left Hand)  Patient Location: PACU  Anesthesia Type:General  Level of Consciousness: sedated  Airway & Oxygen Therapy: Patient Spontanous Breathing and Patient connected to face mask oxygen  Post-op Assessment: Report given to RN and Post -op Vital signs reviewed and stable  Post vital signs: Reviewed and stable  Last Vitals:  Vitals:   02/03/18 0814  BP: 111/69  Pulse: 90  Resp: 18  Temp: 36.8 C  SpO2: 100%    Last Pain:  Vitals:   02/03/18 0814  TempSrc: Oral  PainSc: 5       Patients Stated Pain Goal: 2 (44/03/47 4259)  Complications: No apparent anesthesia complications

## 2018-02-03 NOTE — Progress Notes (Signed)
The lab called and reported that this pt blood work came back to resulting in "few WBC predominantly PMN's  Abundant gram negative rods and few gram positive coccyx."

## 2018-02-03 NOTE — Discharge Instructions (Signed)

## 2018-02-03 NOTE — Anesthesia Procedure Notes (Signed)
Procedure Name: LMA Insertion Date/Time: 02/03/2018 9:59 AM Performed by: Maryella Shivers, CRNA Pre-anesthesia Checklist: Patient identified, Emergency Drugs available, Suction available and Patient being monitored Patient Re-evaluated:Patient Re-evaluated prior to induction Oxygen Delivery Method: Circle system utilized Preoxygenation: Pre-oxygenation with 100% oxygen Induction Type: IV induction Ventilation: Mask ventilation without difficulty LMA: LMA inserted LMA Size: 5.0 Number of attempts: 1 Airway Equipment and Method: Bite block Placement Confirmation: positive ETCO2 Tube secured with: Tape Dental Injury: Teeth and Oropharynx as per pre-operative assessment

## 2018-02-03 NOTE — Anesthesia Postprocedure Evaluation (Signed)
Anesthesia Post Note  Patient: Shawn [name]  Procedure(s) Performed: INCISION AND DRAINAGE LEFT THUMB INFECTION (Left Hand)     Patient location during evaluation: PACU Anesthesia Type: General Level of consciousness: awake and alert Pain management: pain level controlled Vital Signs Assessment: post-procedure vital signs reviewed and stable Respiratory status: spontaneous breathing, nonlabored ventilation, respiratory function stable and patient connected to nasal cannula oxygen Cardiovascular status: blood pressure returned to baseline and stable Postop Assessment: no apparent nausea or vomiting Anesthetic complications: no    Last Vitals:  Vitals:   02/03/18 1115 02/03/18 1130  BP: 131/85 128/89  Pulse: 94 94  Resp: 19 (!) 22  Temp:    SpO2: 100% 99%    Last Pain:  Vitals:   02/03/18 1130  TempSrc:   PainSc: 5                  Khyson Sebesta S

## 2018-02-03 NOTE — Anesthesia Preprocedure Evaluation (Signed)
Anesthesia Evaluation  Patient identified by MRN, date of birth, ID band Patient awake    Reviewed: Allergy & Precautions, NPO status , Patient's Chart, lab work & pertinent test results  Airway Mallampati: II  TM Distance: >3 FB Neck ROM: Full    Dental no notable dental hx.    Pulmonary neg pulmonary ROS,    Pulmonary exam normal breath sounds clear to auscultation       Cardiovascular negative cardio ROS Normal cardiovascular exam Rhythm:Regular Rate:Normal     Neuro/Psych negative neurological ROS  negative psych ROS   GI/Hepatic negative GI ROS, Neg liver ROS,   Endo/Other  diabetes  Renal/GU negative Renal ROS  negative genitourinary   Musculoskeletal negative musculoskeletal ROS (+)   Abdominal   Peds negative pediatric ROS (+)  Hematology negative hematology ROS (+)   Anesthesia Other Findings   Reproductive/Obstetrics negative OB ROS                             Anesthesia Physical Anesthesia Plan  ASA: II  Anesthesia Plan: General   Post-op Pain Management:    Induction: Intravenous  PONV Risk Score and Plan: 2 and Ondansetron, Dexamethasone and Treatment may vary due to age or medical condition  Airway Management Planned: LMA  Additional Equipment:   Intra-op Plan:   Post-operative Plan: Extubation in OR  Informed Consent: I have reviewed the patients History and Physical, chart, labs and discussed the procedure including the risks, benefits and alternatives for the proposed anesthesia with the patient or authorized representative who has indicated his/her understanding and acceptance.   Dental advisory given  Plan Discussed with: CRNA and Surgeon  Anesthesia Plan Comments:         Anesthesia Quick Evaluation

## 2018-02-03 NOTE — Op Note (Signed)
Please see operative report [phone]

## 2018-02-03 NOTE — H&P (Signed)
Shawn [name] is an 67 y.o. male.   Chief Complaint: Left thumb pain and chronic drainage HPI: Patient is a very pleasant 67 year old right-hand-dominant male status post proximal phalanx trauma on December 15, 2017 with a history now of chronic drainage, swelling, and pain over the dorsal aspect of his left thumb proximal phalanx with radiographic evidence of probable osteomyelitis.  Past Medical History:  Diagnosis Date  . Diabetes mellitus without complication ([name])   . Elevated cholesterol     Past Surgical History:  Procedure Laterality Date  . CHOLECYSTECTOMY    . EYE SURGERY    . TONSILLECTOMY      History reviewed. No pertinent family history. Social History:  reports that  has never smoked. he has never used smokeless tobacco. His alcohol and drug histories are not on file.  Allergies: Not on File  Medications Prior to Admission  Medication Sig Dispense Refill  . ciprofloxacin (CIPRO) 750 MG tablet Take 750 mg by mouth 2 (two) times daily.    . clindamycin (CLEOCIN) 300 MG capsule Take 300 mg by mouth 3 (three) times daily.    . DULoxetine (CYMBALTA) 60 MG capsule Take 60 mg by mouth daily.    Marland Kitchen glipiZIDE (GLUCOTROL XL) 10 MG 24 hr tablet Take 10 mg by mouth daily with breakfast.    . HYDROcodone-acetaminophen (NORCO) 10-325 MG tablet Take 1 tablet by mouth every 6 (six) hours as needed.    . metFORMIN (GLUCOPHAGE) 500 MG tablet Take 500 mg by mouth 2 (two) times daily with a meal.    . naproxen sodium (ANAPROX) 550 MG tablet Take 550 mg by mouth 2 (two) times daily with a meal.    . pioglitazone (ACTOS) 15 MG tablet Take 15 mg by mouth daily.    . pravastatin (PRAVACHOL) 40 MG tablet Take 40 mg by mouth daily.    . sitaGLIPtin (JANUVIA) 100 MG tablet Take 100 mg by mouth daily.      Results for orders placed or performed during the hospital encounter of 02/03/18 (from the past 48 hour(s))  I-STAT, chem 8     Status: Abnormal   Collection Time: 02/03/18  8:52 AM  Result  Value Ref Range   Sodium 136 135 - 145 mmol/L   Potassium 5.3 (H) 3.5 - 5.1 mmol/L   Chloride 102 101 - 111 mmol/L   BUN 20 6 - 20 mg/dL   Creatinine, Ser 1.10 0.61 - 1.24 mg/dL   Glucose, Bld 123 (H) 65 - 99 mg/dL   Calcium, Ion 1.06 (L) 1.15 - 1.40 mmol/L   TCO2 28 22 - 32 mmol/L   Hemoglobin 14.6 13.0 - 17.0 g/dL   HCT 43.0 39.0 - 52.0 %   No results found.  Review of Systems  All other systems reviewed and are negative.   Blood pressure 111/69, pulse 90, temperature 98.3 F (36.8 C), temperature source Oral, resp. rate 18, height 5\' 8"  (1.727 m), weight 88 kg (194 lb), SpO2 100 %. Physical Exam  Constitutional: He is oriented to person, place, and time. He appears well-developed and well-nourished.  HENT:  Head: Normocephalic and atraumatic.  Neck: Normal range of motion.  Cardiovascular: Normal rate.  Respiratory: Effort normal.  Musculoskeletal:       Left hand: He exhibits laceration and swelling.  Left thumb swelling, pain, and chronic drainage  Neurological: He is alert and oriented to person, place, and time.  Skin: Skin is warm. There is erythema.  Psychiatric: He has a normal  mood and affect. His behavior is normal. Judgment and thought content normal.     Assessment/Plan 67 year old male with chronic osteomyelitis involving the left thumb proximal phalanx and distal phalanx.  Have discussed the role of incision and drainage, debridement, and cultures as an outpatient today.  Patient understands the need for the surgery for definitive diagnosis of the infecting organism and determine the appropriate antibiotic treatment.  Patient understands the risks and benefits and wishes to proceed.  Schuyler Amor, MD 02/03/2018, 9:41 AM

## 2018-02-04 ENCOUNTER — Encounter (HOSPITAL_BASED_OUTPATIENT_CLINIC_OR_DEPARTMENT_OTHER): Payer: Self-pay | Admitting: Orthopedic Surgery

## 2018-02-04 NOTE — Op Note (Signed)
NAMEWAYLAN, BUSTA                ACCOUNT NO.:  [id number]  MEDICAL RECORD NO.:  [id number]  LOCATION:                                 FACILITY:  PHYSICIAN:  Sheral Apley. Burney Gauze, M.D.   DATE OF BIRTH:  DATE OF PROCEDURE:  02/03/2018 DATE OF DISCHARGE:                              OPERATIVE REPORT   PREOPERATIVE DIAGNOSIS:  Chronic infection, left thumb interphalangeal joint and proximal phalanx.  POSTOPERATIVE DIAGNOSIS:  Chronic infection, left thumb interphalangeal joint and proximal phalanx.  PROCEDURE:  Incision and drainage of chronic infection, left thumb, with debridement of tendon and bone with cultures of both soft tissue and bone and open packing of wound.  SURGEON:  Sheral Apley. Burney Gauze, MD.  ASSISTANT:  None.  ANESTHESIA:  General.  COMPLICATIONS:  None.  DRAINS:  None.  The patient was taken to the operating suite and after induction of adequate general anesthetic, the left upper extremity was prepped and draped in the usual sterile fashion.  An Esmarch was used to exsanguinate the limb and the tourniquet was inflated to 250 mmHg.  At this point in time, a chronic open wound measuring 2.5 x 2.5 cm over the left thumb interphalangeal joint was extended proximally for 3-4 cm. Dissection was carried down to the level of the extensor pollicis brevis tendon.  We debrided the extensor pollicis brevis tendon down to a normal tendon, which was just distal to the metacarpophalangeal joint. We excised approximately the distal 3 cm of the tendon including what was left over the interphalangeal joint.  We also debrided chronic inflamed tissue over the interphalangeal joint and a significant amount of bone off the distal aspect of the proximal phalanx at the IP joint. The soft tissues were cultured and the bone was cultured.  We then thoroughly irrigated this with a liter of normal saline and then loosely closed the incision with 4-0 nylon, leaving the distal chronic  wound open and packed open with quarter-inch iodoform gauze.  4 x 4's, fluffs, and compression wrap were applied.  The patient tolerated this procedure well and went to the recovery room in stable fashion.    Sheral Apley Burney Gauze, M.D.    MAW/MEDQ  D:  02/03/2018  T:  02/03/2018  Job:  [id number]

## 2018-02-05 DIAGNOSIS — M25642 Stiffness of left hand, not elsewhere classified: Secondary | ICD-10-CM | POA: Diagnosis not present

## 2018-02-05 DIAGNOSIS — M79645 Pain in left finger(s): Secondary | ICD-10-CM | POA: Diagnosis not present

## 2018-02-05 DIAGNOSIS — M86442 Chronic osteomyelitis with draining sinus, left hand: Secondary | ICD-10-CM | POA: Diagnosis not present

## 2018-02-05 DIAGNOSIS — S61209D Unspecified open wound of unspecified finger without damage to nail, subsequent encounter: Secondary | ICD-10-CM | POA: Diagnosis not present

## 2018-02-08 LAB — AEROBIC/ANAEROBIC CULTURE W GRAM STAIN (SURGICAL/DEEP WOUND)

## 2018-02-15 DIAGNOSIS — M25642 Stiffness of left hand, not elsewhere classified: Secondary | ICD-10-CM | POA: Diagnosis not present

## 2018-02-15 DIAGNOSIS — S61209D Unspecified open wound of unspecified finger without damage to nail, subsequent encounter: Secondary | ICD-10-CM | POA: Diagnosis not present

## 2018-02-15 DIAGNOSIS — M86442 Chronic osteomyelitis with draining sinus, left hand: Secondary | ICD-10-CM | POA: Diagnosis not present

## 2018-02-15 DIAGNOSIS — M79645 Pain in left finger(s): Secondary | ICD-10-CM | POA: Diagnosis not present

## 2018-02-18 ENCOUNTER — Encounter: Payer: Self-pay | Admitting: Internal Medicine

## 2018-02-18 ENCOUNTER — Ambulatory Visit (INDEPENDENT_AMBULATORY_CARE_PROVIDER_SITE_OTHER): Payer: Medicare HMO | Admitting: Internal Medicine

## 2018-02-18 DIAGNOSIS — M86142 Other acute osteomyelitis, left hand: Secondary | ICD-10-CM | POA: Diagnosis not present

## 2018-02-18 MED ORDER — AMOXICILLIN-POT CLAVULANATE 875-125 MG PO TABS
1.0000 | ORAL_TABLET | Freq: Two times a day (BID) | ORAL | 1 refills | Status: DC
Start: 2018-02-18 — End: 2019-01-11

## 2018-02-18 NOTE — Progress Notes (Signed)
Shawn [name] for Infectious Disease  Reason for Consult: Left thumb osteomyelitis Referring Physician: Dr. Charlotte Crumb  Assessment: He has polymicrobial osteomyelitis and septic arthritis of his left thumb following a traumatic injury a little over 2 months ago.  This has persisted despite 6 weeks of oral antibiotic therapy.  His recent oral antibiotic therapy provided reasonable coverage except for enterococcus.  I reviewed antibiotic options with them today.  He is not a good candidate for outpatient IV antibiotics given that he lives alone and does not have full use of his dominant left hand.  After reviewing options with them he prefers to try switching back to a more prolonged course of oral amoxicillin clavulanate.  I will check blood work today and see him back in 1 week.  Plan: 1. CBC, BMP, sed rate and C-reactive protein 2. Restart amoxicillin clavulanate 3. Follow-up in 1 week  Patient Active Problem List   Diagnosis Date Noted  . Osteomyelitis of hand, left, acute ([place]) 02/18/2018    Patient's Medications  New Prescriptions   AMOXICILLIN-CLAVULANATE (AUGMENTIN) 875-125 MG TABLET    Take 1 tablet by mouth 2 (two) times daily.  Previous Medications   DULOXETINE (CYMBALTA) 60 MG CAPSULE    Take 60 mg by mouth daily.   GLIPIZIDE (GLUCOTROL XL) 10 MG 24 HR TABLET    Take 10 mg by mouth daily with breakfast.   HYDROCODONE-ACETAMINOPHEN (NORCO) 10-325 MG TABLET    Take 1 tablet by mouth every 6 (six) hours as needed.   METFORMIN (GLUCOPHAGE) 500 MG TABLET    Take 500 mg by mouth 2 (two) times daily with a meal.   NAPROXEN SODIUM (ANAPROX) 550 MG TABLET    Take 550 mg by mouth 2 (two) times daily with a meal.   PIOGLITAZONE (ACTOS) 15 MG TABLET    Take 15 mg by mouth daily.   PRAVASTATIN (PRAVACHOL) 40 MG TABLET    Take 40 mg by mouth daily.   SITAGLIPTIN (JANUVIA) 100 MG TABLET    Take 100 mg by mouth daily.  Modified Medications   No medications on file    Discontinued Medications   CIPROFLOXACIN (CIPRO) 750 MG TABLET    Take 750 mg by mouth 2 (two) times daily.   CLINDAMYCIN (CLEOCIN) 300 MG CAPSULE    Take 300 mg by mouth 3 (three) times daily.    HPI: Shawn [name] is a 67 y.o. male who was walking outside of his home on New Year's Day.  He tripped and fell striking his left hand on the pavement.  He does not recall any break in the skin but noted fairly sudden onset of diffuse swelling of his left thumb associated with pain, redness and warmth.  He waited 2 weeks until he was seen by his primary care provider who immediately referred him to the emergency department at [facility].  An x-ray there did not show any fracture.  Ulceration was noted over the top of his thumb and there was some malodorous drainage.  He was started on empiric amoxicillin clavulanate and trimethoprim sulfamethoxazole.  It appears that he was referred back to his primary care provider, [name] PA.  At some point his antibiotics were changed to ciprofloxacin and clindamycin and he was referred to Dr. Burney Gauze.  His first visit was on 02/01/2018.  He noted a 1 x 2 cm ulcer over the proximal phalanx.  Plain x-rays show degenerative changes as well as osteomyelitis  involving the distal and proximal phalanges.  He underwent incision and drainage on 02/03/2018.  Operative Gram stain showed gram-negative rods and gram-positive cocci in pairs.  Cultures grew group B strep, enterococcus, diphtheroids and multiple anaerobes.  He was continued on ciprofloxacin and clindamycin until a follow-up visit on 02/08/2018.  Dr. Burney Gauze felt like his thumb was looking much better that day.  He has not been on any antibiotics since that time.  He has started whirlpool therapy.  Some of his sutures were removed on follow-up visit 02/15/2018.  He is left-handed.  He lives alone in [place], [place].  Review of Systems: Review of Systems  Constitutional: Negative for chills, diaphoresis and fever.   Gastrointestinal: Negative for abdominal pain, diarrhea, nausea and vomiting.  Musculoskeletal: Positive for joint pain.      Past Medical History:  Diagnosis Date  . Diabetes mellitus without complication ([place])   . Elevated cholesterol     Social History   Tobacco Use  . Smoking status: Never Smoker  . Smokeless tobacco: Never Used  Substance Use Topics  . Alcohol use: Not on file  . Drug use: Not on file    No family history on file. No Known Allergies  OBJECTIVE: Vitals:   02/18/18 0926  BP: (!) 167/108  Pulse: (!) 112  Temp: 97.8 F (36.6 C)  TempSrc: Oral  Weight: 195 lb (88.5 kg)   Body mass index is 29.65 kg/m.   Physical Exam  Constitutional: He is oriented to person, place, and time.  He is in no distress.  He is accompanied by his mother.  He is slow to answer questions and often defers to his mother.  He cannot provide much detail about his recent illness.  Musculoskeletal:  He has diffuse swelling of his left thumb with hyperpigmentation.  There is an irregular shaped ulcer over the dorsum of his distal phalanx.  There is no drainage or odor.  Sutures have been removed distally but remain proximally.  Neurological: He is alert and oriented to person, place, and time.  Skin: No rash noted.  Psychiatric: Mood and affect normal.    Microbiology: No results found for this or any previous visit (from the past 240 hour(s)).  Michel Bickers, MD [facility] for Infectious [place] [facility] [phone] pager   [phone] cell 02/18/2018, 10:21 AM

## 2018-02-19 LAB — CBC
HCT: 40.7 % (ref 38.5–50.0)
Hemoglobin: 14.3 g/dL (ref 13.2–17.1)
MCH: 32.3 pg (ref 27.0–33.0)
MCHC: 35.1 g/dL (ref 32.0–36.0)
MCV: 91.9 fL (ref 80.0–100.0)
MPV: 9.2 fL (ref 7.5–12.5)
Platelets: 270 10*3/uL (ref 140–400)
RBC: 4.43 10*6/uL (ref 4.20–5.80)
RDW: 13.3 % (ref 11.0–15.0)
WBC: 6.7 10*3/uL (ref 3.8–10.8)

## 2018-02-19 LAB — BASIC METABOLIC PANEL
BUN: 11 mg/dL (ref 7–25)
CALCIUM: 9.6 mg/dL (ref 8.6–10.3)
CO2: 24 mmol/L (ref 20–32)
Chloride: 102 mmol/L (ref 98–110)
Creat: 0.92 mg/dL (ref 0.70–1.25)
GLUCOSE: 281 mg/dL — AB (ref 65–99)
POTASSIUM: 4.2 mmol/L (ref 3.5–5.3)
Sodium: 137 mmol/L (ref 135–146)

## 2018-02-19 LAB — SEDIMENTATION RATE: Sed Rate: 63 mm/h — ABNORMAL HIGH (ref 0–20)

## 2018-02-19 LAB — C-REACTIVE PROTEIN: CRP: 2.7 mg/L (ref <8.0)

## 2018-02-25 ENCOUNTER — Ambulatory Visit (INDEPENDENT_AMBULATORY_CARE_PROVIDER_SITE_OTHER): Payer: Medicare HMO | Admitting: Internal Medicine

## 2018-02-25 ENCOUNTER — Encounter: Payer: Self-pay | Admitting: Internal Medicine

## 2018-02-25 DIAGNOSIS — M86142 Other acute osteomyelitis, left hand: Secondary | ICD-10-CM | POA: Diagnosis not present

## 2018-02-25 NOTE — Assessment & Plan Note (Signed)
He seems to be improving slowly on therapy for smoldering polymicrobial (group B strep, enterococcus, diphtheroids and multiple anaerobes) infection of his left thumb.  He has osteomyelitis and septic arthritis.  I plan to continue amoxicillin clavulanate for at least 6 weeks.  He will follow-up here in 5 weeks.

## 2018-02-25 NOTE — Progress Notes (Signed)
[place] for Infectious Disease  Patient Active Problem List   Diagnosis Date Noted  . Osteomyelitis of hand, left, acute ([place]) 02/18/2018    Patient's Medications  New Prescriptions   No medications on file  Previous Medications   AMOXICILLIN-CLAVULANATE (AUGMENTIN) 875-125 MG TABLET    Take 1 tablet by mouth 2 (two) times daily.   DULOXETINE (CYMBALTA) 60 MG CAPSULE    Take 60 mg by mouth daily.   GLIPIZIDE (GLUCOTROL XL) 10 MG 24 HR TABLET    Take 10 mg by mouth daily with breakfast.   HYDROCODONE-ACETAMINOPHEN (NORCO) 10-325 MG TABLET    Take 1 tablet by mouth every 6 (six) hours as needed.   METFORMIN (GLUCOPHAGE) 500 MG TABLET    Take 500 mg by mouth 2 (two) times daily with a meal.   NAPROXEN SODIUM (ANAPROX) 550 MG TABLET    Take 550 mg by mouth 2 (two) times daily with a meal.   PIOGLITAZONE (ACTOS) 15 MG TABLET    Take 15 mg by mouth daily.   PRAVASTATIN (PRAVACHOL) 40 MG TABLET    Take 40 mg by mouth daily.   SITAGLIPTIN (JANUVIA) 100 MG TABLET    Take 100 mg by mouth daily.  Modified Medications   No medications on file  Discontinued Medications   No medications on file    Subjective: Shawn [name] is in with his mother for his routine follow-up visit.  He has now been back on amoxicillin clavulanate for 1 week.  He has had no problems tolerating it.  He still has some stinging discomfort in his left thumb but overall is feeling better.  His remaining sutures were removed by Dr. Burney Gauze 2 days ago.  His mother is changing his dressing twice daily.  She feels like the wound is looking better.  They do note slight malodor when changing the dressing.  This seems to be getting a little better.  Review of Systems: Review of Systems  Constitutional: Negative for chills, diaphoresis and fever.  Gastrointestinal: Negative for abdominal pain, diarrhea, nausea and vomiting.  Musculoskeletal: Positive for joint pain.    Past Medical History:  Diagnosis Date  .  Diabetes mellitus without complication ([place])   . Elevated cholesterol     Social History   Tobacco Use  . Smoking status: Never Smoker  . Smokeless tobacco: Never Used  Substance Use Topics  . Alcohol use: No    Frequency: Never  . Drug use: No    No family history on file.  No Known Allergies  Objective: Vitals:   02/25/18 1337  BP: (!) 142/88  Pulse: (!) 103  Temp: 98.2 F (36.8 C)  TempSrc: Oral  Weight: 194 lb (88 kg)  Height: 5\' 8"  (1.727 m)   Body mass index is 29.5 kg/m.  Physical Exam  Constitutional: He is oriented to person, place, and time.  He appears comfortable.  He is more interactive today than he was last week.  Musculoskeletal:  Swelling of his left thumb has decreased slightly.  All sutures are out.  The distal wound appears cleaner.  I do not detect any malodor.  Neurological: He is alert and oriented to person, place, and time.  Psychiatric: Mood and affect normal.    Lab Results Lab Results  Component Value Date   WBC 6.7 02/18/2018   HGB 14.3 02/18/2018   HCT 40.7 02/18/2018   MCV 91.9 02/18/2018   PLT 270 02/18/2018   BMET  Component Value Date/Time   NA 137 02/18/2018 1031   K 4.2 02/18/2018 1031   CL 102 02/18/2018 1031   CO2 24 02/18/2018 1031   GLUCOSE 281 (H) 02/18/2018 1031   BUN 11 02/18/2018 1031   CREATININE 0.92 02/18/2018 1031   CALCIUM 9.6 02/18/2018 1031   Sed Rate (mm/h)  Date Value  02/18/2018 63 (H)   CRP (mg/L)  Date Value  02/18/2018 2.7     Problem List Items Addressed This Visit      Unprioritized   Osteomyelitis of hand, left, acute ([place])    He seems to be improving slowly on therapy for smoldering polymicrobial (group B strep, enterococcus, diphtheroids and multiple anaerobes) infection of his left thumb.  He has osteomyelitis and septic arthritis.  I plan to continue amoxicillin clavulanate for at least 6 weeks.  He will follow-up here in 5 weeks.            Michel Bickers,  MD [facility] for Infectious [place] [facility] [phone] pager   [phone] cell 02/25/2018, 1:56 PM

## 2018-03-11 DIAGNOSIS — M86442 Chronic osteomyelitis with draining sinus, left hand: Secondary | ICD-10-CM | POA: Diagnosis not present

## 2018-03-11 DIAGNOSIS — M79645 Pain in left finger(s): Secondary | ICD-10-CM | POA: Diagnosis not present

## 2018-04-01 ENCOUNTER — Ambulatory Visit (INDEPENDENT_AMBULATORY_CARE_PROVIDER_SITE_OTHER): Payer: Medicare HMO | Admitting: Internal Medicine

## 2018-04-01 ENCOUNTER — Encounter: Payer: Self-pay | Admitting: Internal Medicine

## 2018-04-01 DIAGNOSIS — M86442 Chronic osteomyelitis with draining sinus, left hand: Secondary | ICD-10-CM | POA: Diagnosis not present

## 2018-04-01 DIAGNOSIS — M79645 Pain in left finger(s): Secondary | ICD-10-CM | POA: Diagnosis not present

## 2018-04-01 DIAGNOSIS — M86142 Other acute osteomyelitis, left hand: Secondary | ICD-10-CM | POA: Diagnosis not present

## 2018-04-01 MED ORDER — METRONIDAZOLE 500 MG PO TABS: 500.0000 mg | ORAL_TABLET | Freq: Three times a day (TID) | ORAL | 1 refills | Status: DC

## 2018-04-01 MED ORDER — LEVOFLOXACIN 500 MG PO TABS: 500.0000 mg | ORAL_TABLET | Freq: Every day | ORAL | 1 refills | Status: DC

## 2018-04-01 NOTE — Progress Notes (Signed)
Shawn [name] for Infectious Disease  Patient Active Problem List   Diagnosis Date Noted  . Osteomyelitis of hand, left, acute ([place]) 02/18/2018    Patient's Medications  New Prescriptions   LEVOFLOXACIN (LEVAQUIN) 500 MG TABLET    Take 1 tablet (500 mg total) by mouth daily.   METRONIDAZOLE (FLAGYL) 500 MG TABLET    Take 1 tablet (500 mg total) by mouth 3 (three) times daily.  Previous Medications   AMOXICILLIN-CLAVULANATE (AUGMENTIN) 875-125 MG TABLET    Take 1 tablet by mouth 2 (two) times daily.   DULOXETINE (CYMBALTA) 60 MG CAPSULE    Take 60 mg by mouth daily.   GLIPIZIDE (GLUCOTROL XL) 10 MG 24 HR TABLET    Take 10 mg by mouth daily with breakfast.   HYDROCODONE-ACETAMINOPHEN (NORCO) 10-325 MG TABLET    Take 1 tablet by mouth every 6 (six) hours as needed.   METFORMIN (GLUCOPHAGE) 500 MG TABLET    Take 500 mg by mouth 2 (two) times daily with a meal.   NAPROXEN SODIUM (ANAPROX) 550 MG TABLET    Take 550 mg by mouth 2 (two) times daily with a meal.   PIOGLITAZONE (ACTOS) 15 MG TABLET    Take 15 mg by mouth daily.   PRAVASTATIN (PRAVACHOL) 40 MG TABLET    Take 40 mg by mouth daily.   SITAGLIPTIN (JANUVIA) 100 MG TABLET    Take 100 mg by mouth daily.  Modified Medications   No medications on file  Discontinued Medications   No medications on file    Subjective: Shawn [name] is in for his routine follow-up visit.  He completed 6 weeks of oral amoxicillin clavulanate yesterday for his polymicrobial osteomyelitis of his left thumb.  He is worried because he still has significant malodor from his wound.  He saw Dr. Burney Gauze this morning.  His note indicated:  "Radiographs in office today show significant destruction of the distal aspect of the proximal phalanx and some early reconstitution of the distal phalanx. His wound is slowly healing with good granulation tissue."    "  Review of Systems: Review of Systems  Constitutional: Negative for chills, diaphoresis and  fever.  Gastrointestinal: Negative for abdominal pain, diarrhea, nausea and vomiting.  Musculoskeletal: Positive for joint pain.    Past Medical History:  Diagnosis Date  . Diabetes mellitus without complication ([place])   . Elevated cholesterol     Social History   Tobacco Use  . Smoking status: Never Smoker  . Smokeless tobacco: Never Used  Substance Use Topics  . Alcohol use: No    Frequency: Never  . Drug use: No    No family history on file.  No Known Allergies  Objective: Vitals:   04/01/18 1109  Weight: 195 lb (88.5 kg)   Body mass index is 29.65 kg/m.  Physical Exam  Constitutional:  He is pleasant and in no distress.  Musculoskeletal:  The wound over his dorsal left thumb is healing poorly.  Even though his dressing was just changed this morning there is significant malodor.  There is some thin bloody drainage on the dressing.    Lab Results    Problem List Items Addressed This Visit      Unprioritized   Osteomyelitis of hand, left, acute ([place])    I will continue antibiotic therapy but will make an empiric change to oral levofloxacin and metronidazole.  He does drink beer occasionally but does not feel it will be a  problem.  Because of the drug drug interaction with metronidazole.  He will follow-up here in 3 weeks.      Relevant Medications   levofloxacin (LEVAQUIN) 500 MG tablet   metroNIDAZOLE (FLAGYL) 500 MG tablet       Michel Bickers, MD [facility] for Infectious [place] [phone] pager   [phone] cell 04/01/2018, 11:24 AM

## 2018-04-01 NOTE — Assessment & Plan Note (Signed)
I will continue antibiotic therapy but will make an empiric change to oral levofloxacin and metronidazole.  He does drink beer occasionally but does not feel it will be a problem.  Because of the drug drug interaction with metronidazole.  He will follow-up here in 3 weeks.

## 2018-05-20 DIAGNOSIS — M79645 Pain in left finger(s): Secondary | ICD-10-CM | POA: Diagnosis not present

## 2018-06-28 DIAGNOSIS — I10 Essential (primary) hypertension: Secondary | ICD-10-CM | POA: Diagnosis not present

## 2018-06-28 DIAGNOSIS — Z89511 Acquired absence of right leg below knee: Secondary | ICD-10-CM | POA: Diagnosis not present

## 2018-06-28 DIAGNOSIS — E1149 Type 2 diabetes mellitus with other diabetic neurological complication: Secondary | ICD-10-CM | POA: Diagnosis not present

## 2018-06-28 DIAGNOSIS — L03032 Cellulitis of left toe: Secondary | ICD-10-CM | POA: Diagnosis not present

## 2018-06-28 DIAGNOSIS — Z6828 Body mass index (BMI) 28.0-28.9, adult: Secondary | ICD-10-CM | POA: Diagnosis not present

## 2018-07-05 DIAGNOSIS — Z1339 Encounter for screening examination for other mental health and behavioral disorders: Secondary | ICD-10-CM | POA: Diagnosis not present

## 2018-07-05 DIAGNOSIS — Z6828 Body mass index (BMI) 28.0-28.9, adult: Secondary | ICD-10-CM | POA: Diagnosis not present

## 2018-07-05 DIAGNOSIS — F1020 Alcohol dependence, uncomplicated: Secondary | ICD-10-CM | POA: Diagnosis not present

## 2018-07-05 DIAGNOSIS — E1149 Type 2 diabetes mellitus with other diabetic neurological complication: Secondary | ICD-10-CM | POA: Diagnosis not present

## 2018-07-05 DIAGNOSIS — E7800 Pure hypercholesterolemia, unspecified: Secondary | ICD-10-CM | POA: Diagnosis not present

## 2018-07-05 DIAGNOSIS — L03032 Cellulitis of left toe: Secondary | ICD-10-CM | POA: Diagnosis not present

## 2018-07-19 DIAGNOSIS — E1149 Type 2 diabetes mellitus with other diabetic neurological complication: Secondary | ICD-10-CM | POA: Diagnosis not present

## 2018-07-19 DIAGNOSIS — I739 Peripheral vascular disease, unspecified: Secondary | ICD-10-CM | POA: Diagnosis not present

## 2018-07-19 DIAGNOSIS — Z6828 Body mass index (BMI) 28.0-28.9, adult: Secondary | ICD-10-CM | POA: Diagnosis not present

## 2018-07-19 DIAGNOSIS — G629 Polyneuropathy, unspecified: Secondary | ICD-10-CM | POA: Diagnosis not present

## 2018-07-22 DIAGNOSIS — E11621 Type 2 diabetes mellitus with foot ulcer: Secondary | ICD-10-CM | POA: Diagnosis not present

## 2018-07-22 DIAGNOSIS — M869 Osteomyelitis, unspecified: Secondary | ICD-10-CM | POA: Diagnosis not present

## 2018-07-22 DIAGNOSIS — E1165 Type 2 diabetes mellitus with hyperglycemia: Secondary | ICD-10-CM | POA: Diagnosis not present

## 2018-07-22 DIAGNOSIS — I10 Essential (primary) hypertension: Secondary | ICD-10-CM | POA: Diagnosis not present

## 2018-07-22 DIAGNOSIS — E1169 Type 2 diabetes mellitus with other specified complication: Secondary | ICD-10-CM | POA: Diagnosis not present

## 2018-07-22 DIAGNOSIS — R000 Tachycardia, unspecified: Secondary | ICD-10-CM | POA: Diagnosis not present

## 2018-07-22 DIAGNOSIS — D649 Anemia, unspecified: Secondary | ICD-10-CM | POA: Diagnosis not present

## 2018-07-22 DIAGNOSIS — M868X7 Other osteomyelitis, ankle and foot: Secondary | ICD-10-CM | POA: Diagnosis not present

## 2018-07-22 DIAGNOSIS — Z794 Long term (current) use of insulin: Secondary | ICD-10-CM | POA: Diagnosis not present

## 2018-07-22 DIAGNOSIS — Z89511 Acquired absence of right leg below knee: Secondary | ICD-10-CM | POA: Diagnosis not present

## 2018-07-22 DIAGNOSIS — L97529 Non-pressure chronic ulcer of other part of left foot with unspecified severity: Secondary | ICD-10-CM | POA: Diagnosis not present

## 2018-07-22 DIAGNOSIS — F1020 Alcohol dependence, uncomplicated: Secondary | ICD-10-CM | POA: Diagnosis not present

## 2018-07-22 DIAGNOSIS — I771 Stricture of artery: Secondary | ICD-10-CM | POA: Diagnosis not present

## 2018-07-22 DIAGNOSIS — E1152 Type 2 diabetes mellitus with diabetic peripheral angiopathy with gangrene: Secondary | ICD-10-CM | POA: Diagnosis not present

## 2018-07-22 DIAGNOSIS — L03116 Cellulitis of left lower limb: Secondary | ICD-10-CM | POA: Diagnosis not present

## 2018-07-22 DIAGNOSIS — E876 Hypokalemia: Secondary | ICD-10-CM | POA: Diagnosis not present

## 2018-07-22 DIAGNOSIS — E119 Type 2 diabetes mellitus without complications: Secondary | ICD-10-CM | POA: Diagnosis not present

## 2018-07-22 DIAGNOSIS — S91302A Unspecified open wound, left foot, initial encounter: Secondary | ICD-10-CM | POA: Diagnosis not present

## 2018-07-22 DIAGNOSIS — I96 Gangrene, not elsewhere classified: Secondary | ICD-10-CM | POA: Diagnosis not present

## 2018-07-22 DIAGNOSIS — Z9049 Acquired absence of other specified parts of digestive tract: Secondary | ICD-10-CM | POA: Diagnosis not present

## 2018-07-22 DIAGNOSIS — R600 Localized edema: Secondary | ICD-10-CM | POA: Diagnosis not present

## 2018-07-23 DIAGNOSIS — E1159 Type 2 diabetes mellitus with other circulatory complications: Secondary | ICD-10-CM | POA: Insufficient documentation

## 2018-07-25 DIAGNOSIS — E1140 Type 2 diabetes mellitus with diabetic neuropathy, unspecified: Secondary | ICD-10-CM | POA: Diagnosis not present

## 2018-07-25 DIAGNOSIS — R509 Fever, unspecified: Secondary | ICD-10-CM | POA: Diagnosis not present

## 2018-07-25 DIAGNOSIS — E876 Hypokalemia: Secondary | ICD-10-CM | POA: Diagnosis not present

## 2018-07-25 DIAGNOSIS — L03116 Cellulitis of left lower limb: Secondary | ICD-10-CM | POA: Diagnosis not present

## 2018-07-25 DIAGNOSIS — M6281 Muscle weakness (generalized): Secondary | ICD-10-CM | POA: Diagnosis not present

## 2018-07-25 DIAGNOSIS — Z89511 Acquired absence of right leg below knee: Secondary | ICD-10-CM | POA: Diagnosis not present

## 2018-07-25 DIAGNOSIS — M86172 Other acute osteomyelitis, left ankle and foot: Secondary | ICD-10-CM | POA: Diagnosis not present

## 2018-07-25 DIAGNOSIS — Z89512 Acquired absence of left leg below knee: Secondary | ICD-10-CM | POA: Diagnosis not present

## 2018-07-25 DIAGNOSIS — M869 Osteomyelitis, unspecified: Secondary | ICD-10-CM | POA: Diagnosis not present

## 2018-07-25 DIAGNOSIS — E1152 Type 2 diabetes mellitus with diabetic peripheral angiopathy with gangrene: Secondary | ICD-10-CM | POA: Diagnosis not present

## 2018-07-25 DIAGNOSIS — E1169 Type 2 diabetes mellitus with other specified complication: Secondary | ICD-10-CM | POA: Diagnosis not present

## 2018-07-25 DIAGNOSIS — M79605 Pain in left leg: Secondary | ICD-10-CM | POA: Diagnosis not present

## 2018-07-25 DIAGNOSIS — E1159 Type 2 diabetes mellitus with other circulatory complications: Secondary | ICD-10-CM | POA: Diagnosis not present

## 2018-07-25 DIAGNOSIS — Z9119 Patient's noncompliance with other medical treatment and regimen: Secondary | ICD-10-CM | POA: Diagnosis not present

## 2018-07-25 DIAGNOSIS — Z4781 Encounter for orthopedic aftercare following surgical amputation: Secondary | ICD-10-CM | POA: Diagnosis not present

## 2018-07-25 DIAGNOSIS — I739 Peripheral vascular disease, unspecified: Secondary | ICD-10-CM | POA: Diagnosis not present

## 2018-07-25 DIAGNOSIS — R633 Feeding difficulties: Secondary | ICD-10-CM | POA: Diagnosis not present

## 2018-07-25 DIAGNOSIS — Z794 Long term (current) use of insulin: Secondary | ICD-10-CM | POA: Diagnosis not present

## 2018-07-25 DIAGNOSIS — E11621 Type 2 diabetes mellitus with foot ulcer: Secondary | ICD-10-CM | POA: Diagnosis not present

## 2018-07-25 DIAGNOSIS — M868X7 Other osteomyelitis, ankle and foot: Secondary | ICD-10-CM | POA: Diagnosis not present

## 2018-07-25 DIAGNOSIS — F10229 Alcohol dependence with intoxication, unspecified: Secondary | ICD-10-CM | POA: Diagnosis not present

## 2018-07-25 DIAGNOSIS — I10 Essential (primary) hypertension: Secondary | ICD-10-CM | POA: Diagnosis not present

## 2018-07-25 DIAGNOSIS — R918 Other nonspecific abnormal finding of lung field: Secondary | ICD-10-CM | POA: Diagnosis not present

## 2018-07-25 DIAGNOSIS — Z9889 Other specified postprocedural states: Secondary | ICD-10-CM | POA: Diagnosis not present

## 2018-07-25 DIAGNOSIS — I96 Gangrene, not elsewhere classified: Secondary | ICD-10-CM | POA: Diagnosis not present

## 2018-07-25 DIAGNOSIS — M879 Osteonecrosis, unspecified: Secondary | ICD-10-CM | POA: Diagnosis not present

## 2018-07-25 DIAGNOSIS — L97529 Non-pressure chronic ulcer of other part of left foot with unspecified severity: Secondary | ICD-10-CM | POA: Diagnosis not present

## 2018-07-25 DIAGNOSIS — E1165 Type 2 diabetes mellitus with hyperglycemia: Secondary | ICD-10-CM | POA: Diagnosis not present

## 2018-07-25 DIAGNOSIS — E11628 Type 2 diabetes mellitus with other skin complications: Secondary | ICD-10-CM | POA: Diagnosis not present

## 2018-07-25 DIAGNOSIS — Z7984 Long term (current) use of oral hypoglycemic drugs: Secondary | ICD-10-CM | POA: Diagnosis not present

## 2018-07-25 DIAGNOSIS — E118 Type 2 diabetes mellitus with unspecified complications: Secondary | ICD-10-CM | POA: Diagnosis not present

## 2018-07-25 DIAGNOSIS — G8918 Other acute postprocedural pain: Secondary | ICD-10-CM | POA: Diagnosis not present

## 2018-08-11 DIAGNOSIS — Z89511 Acquired absence of right leg below knee: Secondary | ICD-10-CM | POA: Diagnosis not present

## 2018-08-11 DIAGNOSIS — M869 Osteomyelitis, unspecified: Secondary | ICD-10-CM | POA: Diagnosis not present

## 2018-08-11 DIAGNOSIS — Z89512 Acquired absence of left leg below knee: Secondary | ICD-10-CM | POA: Diagnosis not present

## 2018-08-11 DIAGNOSIS — E1159 Type 2 diabetes mellitus with other circulatory complications: Secondary | ICD-10-CM | POA: Diagnosis not present

## 2018-08-11 DIAGNOSIS — I10 Essential (primary) hypertension: Secondary | ICD-10-CM | POA: Diagnosis not present

## 2018-08-11 DIAGNOSIS — L03116 Cellulitis of left lower limb: Secondary | ICD-10-CM | POA: Diagnosis not present

## 2018-08-11 DIAGNOSIS — Z4781 Encounter for orthopedic aftercare following surgical amputation: Secondary | ICD-10-CM | POA: Diagnosis not present

## 2018-08-11 DIAGNOSIS — F10229 Alcohol dependence with intoxication, unspecified: Secondary | ICD-10-CM | POA: Diagnosis not present

## 2018-08-11 DIAGNOSIS — I96 Gangrene, not elsewhere classified: Secondary | ICD-10-CM | POA: Diagnosis not present

## 2018-08-11 DIAGNOSIS — M6281 Muscle weakness (generalized): Secondary | ICD-10-CM | POA: Diagnosis not present

## 2018-08-15 DIAGNOSIS — F10229 Alcohol dependence with intoxication, unspecified: Secondary | ICD-10-CM | POA: Diagnosis not present

## 2018-08-15 DIAGNOSIS — E1159 Type 2 diabetes mellitus with other circulatory complications: Secondary | ICD-10-CM | POA: Diagnosis not present

## 2018-08-15 DIAGNOSIS — M869 Osteomyelitis, unspecified: Secondary | ICD-10-CM | POA: Diagnosis not present

## 2018-08-15 DIAGNOSIS — Z89512 Acquired absence of left leg below knee: Secondary | ICD-10-CM | POA: Diagnosis not present

## 2018-08-15 DIAGNOSIS — I739 Peripheral vascular disease, unspecified: Secondary | ICD-10-CM | POA: Diagnosis not present

## 2018-08-15 DIAGNOSIS — I10 Essential (primary) hypertension: Secondary | ICD-10-CM | POA: Diagnosis not present

## 2018-08-15 DIAGNOSIS — Z4781 Encounter for orthopedic aftercare following surgical amputation: Secondary | ICD-10-CM | POA: Diagnosis not present

## 2018-08-15 DIAGNOSIS — Z09 Encounter for follow-up examination after completed treatment for conditions other than malignant neoplasm: Secondary | ICD-10-CM | POA: Diagnosis not present

## 2018-08-15 DIAGNOSIS — L03116 Cellulitis of left lower limb: Secondary | ICD-10-CM | POA: Diagnosis not present

## 2018-08-15 DIAGNOSIS — I96 Gangrene, not elsewhere classified: Secondary | ICD-10-CM | POA: Diagnosis not present

## 2018-08-15 DIAGNOSIS — M6281 Muscle weakness (generalized): Secondary | ICD-10-CM | POA: Diagnosis not present

## 2018-08-15 DIAGNOSIS — Z89511 Acquired absence of right leg below knee: Secondary | ICD-10-CM | POA: Diagnosis not present

## 2018-08-18 DIAGNOSIS — Z89511 Acquired absence of right leg below knee: Secondary | ICD-10-CM | POA: Insufficient documentation

## 2018-08-18 DIAGNOSIS — I96 Gangrene, not elsewhere classified: Secondary | ICD-10-CM | POA: Diagnosis not present

## 2018-08-18 DIAGNOSIS — Z09 Encounter for follow-up examination after completed treatment for conditions other than malignant neoplasm: Secondary | ICD-10-CM | POA: Diagnosis not present

## 2018-08-18 DIAGNOSIS — Z89512 Acquired absence of left leg below knee: Secondary | ICD-10-CM | POA: Diagnosis not present

## 2018-08-18 DIAGNOSIS — I739 Peripheral vascular disease, unspecified: Secondary | ICD-10-CM | POA: Diagnosis not present

## 2018-08-30 DIAGNOSIS — Z4781 Encounter for orthopedic aftercare following surgical amputation: Secondary | ICD-10-CM | POA: Diagnosis not present

## 2018-08-30 DIAGNOSIS — E1159 Type 2 diabetes mellitus with other circulatory complications: Secondary | ICD-10-CM | POA: Diagnosis not present

## 2018-08-30 DIAGNOSIS — Z89511 Acquired absence of right leg below knee: Secondary | ICD-10-CM | POA: Diagnosis not present

## 2018-08-30 DIAGNOSIS — Z89512 Acquired absence of left leg below knee: Secondary | ICD-10-CM | POA: Diagnosis not present

## 2018-08-31 DIAGNOSIS — E1151 Type 2 diabetes mellitus with diabetic peripheral angiopathy without gangrene: Secondary | ICD-10-CM | POA: Diagnosis not present

## 2018-08-31 DIAGNOSIS — E1165 Type 2 diabetes mellitus with hyperglycemia: Secondary | ICD-10-CM | POA: Diagnosis not present

## 2018-08-31 DIAGNOSIS — I2510 Atherosclerotic heart disease of native coronary artery without angina pectoris: Secondary | ICD-10-CM | POA: Diagnosis not present

## 2018-08-31 DIAGNOSIS — N181 Chronic kidney disease, stage 1: Secondary | ICD-10-CM | POA: Diagnosis not present

## 2018-08-31 DIAGNOSIS — E1142 Type 2 diabetes mellitus with diabetic polyneuropathy: Secondary | ICD-10-CM | POA: Diagnosis not present

## 2018-08-31 DIAGNOSIS — E1122 Type 2 diabetes mellitus with diabetic chronic kidney disease: Secondary | ICD-10-CM | POA: Diagnosis not present

## 2018-08-31 DIAGNOSIS — E1149 Type 2 diabetes mellitus with other diabetic neurological complication: Secondary | ICD-10-CM | POA: Diagnosis not present

## 2018-08-31 DIAGNOSIS — I129 Hypertensive chronic kidney disease with stage 1 through stage 4 chronic kidney disease, or unspecified chronic kidney disease: Secondary | ICD-10-CM | POA: Diagnosis not present

## 2018-08-31 DIAGNOSIS — Z4781 Encounter for orthopedic aftercare following surgical amputation: Secondary | ICD-10-CM | POA: Diagnosis not present

## 2018-09-01 DIAGNOSIS — I739 Peripheral vascular disease, unspecified: Secondary | ICD-10-CM | POA: Diagnosis not present

## 2018-09-01 DIAGNOSIS — Z09 Encounter for follow-up examination after completed treatment for conditions other than malignant neoplasm: Secondary | ICD-10-CM | POA: Diagnosis not present

## 2018-09-02 DIAGNOSIS — N181 Chronic kidney disease, stage 1: Secondary | ICD-10-CM | POA: Diagnosis not present

## 2018-09-02 DIAGNOSIS — E1142 Type 2 diabetes mellitus with diabetic polyneuropathy: Secondary | ICD-10-CM | POA: Diagnosis not present

## 2018-09-02 DIAGNOSIS — E1122 Type 2 diabetes mellitus with diabetic chronic kidney disease: Secondary | ICD-10-CM | POA: Diagnosis not present

## 2018-09-02 DIAGNOSIS — I129 Hypertensive chronic kidney disease with stage 1 through stage 4 chronic kidney disease, or unspecified chronic kidney disease: Secondary | ICD-10-CM | POA: Diagnosis not present

## 2018-09-02 DIAGNOSIS — E1165 Type 2 diabetes mellitus with hyperglycemia: Secondary | ICD-10-CM | POA: Diagnosis not present

## 2018-09-02 DIAGNOSIS — Z4781 Encounter for orthopedic aftercare following surgical amputation: Secondary | ICD-10-CM | POA: Diagnosis not present

## 2018-09-02 DIAGNOSIS — E1149 Type 2 diabetes mellitus with other diabetic neurological complication: Secondary | ICD-10-CM | POA: Diagnosis not present

## 2018-09-02 DIAGNOSIS — I2510 Atherosclerotic heart disease of native coronary artery without angina pectoris: Secondary | ICD-10-CM | POA: Diagnosis not present

## 2018-09-02 DIAGNOSIS — E1151 Type 2 diabetes mellitus with diabetic peripheral angiopathy without gangrene: Secondary | ICD-10-CM | POA: Diagnosis not present

## 2018-09-03 DIAGNOSIS — E1149 Type 2 diabetes mellitus with other diabetic neurological complication: Secondary | ICD-10-CM | POA: Diagnosis not present

## 2018-09-03 DIAGNOSIS — Z79899 Other long term (current) drug therapy: Secondary | ICD-10-CM | POA: Diagnosis not present

## 2018-09-03 DIAGNOSIS — Z89512 Acquired absence of left leg below knee: Secondary | ICD-10-CM | POA: Diagnosis not present

## 2018-09-03 DIAGNOSIS — Z6828 Body mass index (BMI) 28.0-28.9, adult: Secondary | ICD-10-CM | POA: Diagnosis not present

## 2018-09-03 DIAGNOSIS — E1140 Type 2 diabetes mellitus with diabetic neuropathy, unspecified: Secondary | ICD-10-CM | POA: Diagnosis not present

## 2018-09-03 DIAGNOSIS — I10 Essential (primary) hypertension: Secondary | ICD-10-CM | POA: Diagnosis not present

## 2018-09-03 DIAGNOSIS — E519 Thiamine deficiency, unspecified: Secondary | ICD-10-CM | POA: Diagnosis not present

## 2018-09-03 DIAGNOSIS — M869 Osteomyelitis, unspecified: Secondary | ICD-10-CM | POA: Diagnosis not present

## 2018-09-06 DIAGNOSIS — I2510 Atherosclerotic heart disease of native coronary artery without angina pectoris: Secondary | ICD-10-CM | POA: Diagnosis not present

## 2018-09-06 DIAGNOSIS — E1151 Type 2 diabetes mellitus with diabetic peripheral angiopathy without gangrene: Secondary | ICD-10-CM | POA: Diagnosis not present

## 2018-09-06 DIAGNOSIS — I129 Hypertensive chronic kidney disease with stage 1 through stage 4 chronic kidney disease, or unspecified chronic kidney disease: Secondary | ICD-10-CM | POA: Diagnosis not present

## 2018-09-06 DIAGNOSIS — Z4781 Encounter for orthopedic aftercare following surgical amputation: Secondary | ICD-10-CM | POA: Diagnosis not present

## 2018-09-06 DIAGNOSIS — E1165 Type 2 diabetes mellitus with hyperglycemia: Secondary | ICD-10-CM | POA: Diagnosis not present

## 2018-09-06 DIAGNOSIS — E1142 Type 2 diabetes mellitus with diabetic polyneuropathy: Secondary | ICD-10-CM | POA: Diagnosis not present

## 2018-09-06 DIAGNOSIS — E1149 Type 2 diabetes mellitus with other diabetic neurological complication: Secondary | ICD-10-CM | POA: Diagnosis not present

## 2018-09-06 DIAGNOSIS — N181 Chronic kidney disease, stage 1: Secondary | ICD-10-CM | POA: Diagnosis not present

## 2018-09-06 DIAGNOSIS — E1122 Type 2 diabetes mellitus with diabetic chronic kidney disease: Secondary | ICD-10-CM | POA: Diagnosis not present

## 2018-09-07 DIAGNOSIS — E1151 Type 2 diabetes mellitus with diabetic peripheral angiopathy without gangrene: Secondary | ICD-10-CM | POA: Diagnosis not present

## 2018-09-07 DIAGNOSIS — E1142 Type 2 diabetes mellitus with diabetic polyneuropathy: Secondary | ICD-10-CM | POA: Diagnosis not present

## 2018-09-07 DIAGNOSIS — E1149 Type 2 diabetes mellitus with other diabetic neurological complication: Secondary | ICD-10-CM | POA: Diagnosis not present

## 2018-09-07 DIAGNOSIS — E1165 Type 2 diabetes mellitus with hyperglycemia: Secondary | ICD-10-CM | POA: Diagnosis not present

## 2018-09-07 DIAGNOSIS — E1122 Type 2 diabetes mellitus with diabetic chronic kidney disease: Secondary | ICD-10-CM | POA: Diagnosis not present

## 2018-09-07 DIAGNOSIS — N181 Chronic kidney disease, stage 1: Secondary | ICD-10-CM | POA: Diagnosis not present

## 2018-09-07 DIAGNOSIS — Z4781 Encounter for orthopedic aftercare following surgical amputation: Secondary | ICD-10-CM | POA: Diagnosis not present

## 2018-09-07 DIAGNOSIS — I129 Hypertensive chronic kidney disease with stage 1 through stage 4 chronic kidney disease, or unspecified chronic kidney disease: Secondary | ICD-10-CM | POA: Diagnosis not present

## 2018-09-07 DIAGNOSIS — I2510 Atherosclerotic heart disease of native coronary artery without angina pectoris: Secondary | ICD-10-CM | POA: Diagnosis not present

## 2018-09-09 DIAGNOSIS — E1142 Type 2 diabetes mellitus with diabetic polyneuropathy: Secondary | ICD-10-CM | POA: Diagnosis not present

## 2018-09-09 DIAGNOSIS — I129 Hypertensive chronic kidney disease with stage 1 through stage 4 chronic kidney disease, or unspecified chronic kidney disease: Secondary | ICD-10-CM | POA: Diagnosis not present

## 2018-09-09 DIAGNOSIS — Z4781 Encounter for orthopedic aftercare following surgical amputation: Secondary | ICD-10-CM | POA: Diagnosis not present

## 2018-09-09 DIAGNOSIS — N181 Chronic kidney disease, stage 1: Secondary | ICD-10-CM | POA: Diagnosis not present

## 2018-09-09 DIAGNOSIS — E1122 Type 2 diabetes mellitus with diabetic chronic kidney disease: Secondary | ICD-10-CM | POA: Diagnosis not present

## 2018-09-09 DIAGNOSIS — I2510 Atherosclerotic heart disease of native coronary artery without angina pectoris: Secondary | ICD-10-CM | POA: Diagnosis not present

## 2018-09-09 DIAGNOSIS — E1149 Type 2 diabetes mellitus with other diabetic neurological complication: Secondary | ICD-10-CM | POA: Diagnosis not present

## 2018-09-09 DIAGNOSIS — E1165 Type 2 diabetes mellitus with hyperglycemia: Secondary | ICD-10-CM | POA: Diagnosis not present

## 2018-09-09 DIAGNOSIS — E1151 Type 2 diabetes mellitus with diabetic peripheral angiopathy without gangrene: Secondary | ICD-10-CM | POA: Diagnosis not present

## 2018-09-10 DIAGNOSIS — E1165 Type 2 diabetes mellitus with hyperglycemia: Secondary | ICD-10-CM | POA: Diagnosis not present

## 2018-09-10 DIAGNOSIS — I2510 Atherosclerotic heart disease of native coronary artery without angina pectoris: Secondary | ICD-10-CM | POA: Diagnosis not present

## 2018-09-10 DIAGNOSIS — E1151 Type 2 diabetes mellitus with diabetic peripheral angiopathy without gangrene: Secondary | ICD-10-CM | POA: Diagnosis not present

## 2018-09-10 DIAGNOSIS — E1149 Type 2 diabetes mellitus with other diabetic neurological complication: Secondary | ICD-10-CM | POA: Diagnosis not present

## 2018-09-10 DIAGNOSIS — Z4781 Encounter for orthopedic aftercare following surgical amputation: Secondary | ICD-10-CM | POA: Diagnosis not present

## 2018-09-10 DIAGNOSIS — N181 Chronic kidney disease, stage 1: Secondary | ICD-10-CM | POA: Diagnosis not present

## 2018-09-10 DIAGNOSIS — E1122 Type 2 diabetes mellitus with diabetic chronic kidney disease: Secondary | ICD-10-CM | POA: Diagnosis not present

## 2018-09-10 DIAGNOSIS — I129 Hypertensive chronic kidney disease with stage 1 through stage 4 chronic kidney disease, or unspecified chronic kidney disease: Secondary | ICD-10-CM | POA: Diagnosis not present

## 2018-09-10 DIAGNOSIS — E1142 Type 2 diabetes mellitus with diabetic polyneuropathy: Secondary | ICD-10-CM | POA: Diagnosis not present

## 2018-09-14 DIAGNOSIS — N181 Chronic kidney disease, stage 1: Secondary | ICD-10-CM | POA: Diagnosis not present

## 2018-09-14 DIAGNOSIS — Z4781 Encounter for orthopedic aftercare following surgical amputation: Secondary | ICD-10-CM | POA: Diagnosis not present

## 2018-09-14 DIAGNOSIS — E1151 Type 2 diabetes mellitus with diabetic peripheral angiopathy without gangrene: Secondary | ICD-10-CM | POA: Diagnosis not present

## 2018-09-14 DIAGNOSIS — I129 Hypertensive chronic kidney disease with stage 1 through stage 4 chronic kidney disease, or unspecified chronic kidney disease: Secondary | ICD-10-CM | POA: Diagnosis not present

## 2018-09-14 DIAGNOSIS — Z89512 Acquired absence of left leg below knee: Secondary | ICD-10-CM | POA: Diagnosis not present

## 2018-09-14 DIAGNOSIS — E1122 Type 2 diabetes mellitus with diabetic chronic kidney disease: Secondary | ICD-10-CM | POA: Diagnosis not present

## 2018-09-14 DIAGNOSIS — I2510 Atherosclerotic heart disease of native coronary artery without angina pectoris: Secondary | ICD-10-CM | POA: Diagnosis not present

## 2018-09-14 DIAGNOSIS — E1165 Type 2 diabetes mellitus with hyperglycemia: Secondary | ICD-10-CM | POA: Diagnosis not present

## 2018-09-14 DIAGNOSIS — E1142 Type 2 diabetes mellitus with diabetic polyneuropathy: Secondary | ICD-10-CM | POA: Diagnosis not present

## 2018-09-14 DIAGNOSIS — E1149 Type 2 diabetes mellitus with other diabetic neurological complication: Secondary | ICD-10-CM | POA: Diagnosis not present

## 2018-09-15 DIAGNOSIS — E1165 Type 2 diabetes mellitus with hyperglycemia: Secondary | ICD-10-CM | POA: Diagnosis not present

## 2018-09-15 DIAGNOSIS — N181 Chronic kidney disease, stage 1: Secondary | ICD-10-CM | POA: Diagnosis not present

## 2018-09-15 DIAGNOSIS — Z4781 Encounter for orthopedic aftercare following surgical amputation: Secondary | ICD-10-CM | POA: Diagnosis not present

## 2018-09-15 DIAGNOSIS — I129 Hypertensive chronic kidney disease with stage 1 through stage 4 chronic kidney disease, or unspecified chronic kidney disease: Secondary | ICD-10-CM | POA: Diagnosis not present

## 2018-09-15 DIAGNOSIS — E1122 Type 2 diabetes mellitus with diabetic chronic kidney disease: Secondary | ICD-10-CM | POA: Diagnosis not present

## 2018-09-15 DIAGNOSIS — E1149 Type 2 diabetes mellitus with other diabetic neurological complication: Secondary | ICD-10-CM | POA: Diagnosis not present

## 2018-09-15 DIAGNOSIS — I2510 Atherosclerotic heart disease of native coronary artery without angina pectoris: Secondary | ICD-10-CM | POA: Diagnosis not present

## 2018-09-15 DIAGNOSIS — E1142 Type 2 diabetes mellitus with diabetic polyneuropathy: Secondary | ICD-10-CM | POA: Diagnosis not present

## 2018-09-15 DIAGNOSIS — E1151 Type 2 diabetes mellitus with diabetic peripheral angiopathy without gangrene: Secondary | ICD-10-CM | POA: Diagnosis not present

## 2018-09-16 DIAGNOSIS — Z4781 Encounter for orthopedic aftercare following surgical amputation: Secondary | ICD-10-CM | POA: Diagnosis not present

## 2018-09-16 DIAGNOSIS — E1151 Type 2 diabetes mellitus with diabetic peripheral angiopathy without gangrene: Secondary | ICD-10-CM | POA: Diagnosis not present

## 2018-09-16 DIAGNOSIS — E1165 Type 2 diabetes mellitus with hyperglycemia: Secondary | ICD-10-CM | POA: Diagnosis not present

## 2018-09-16 DIAGNOSIS — E1122 Type 2 diabetes mellitus with diabetic chronic kidney disease: Secondary | ICD-10-CM | POA: Diagnosis not present

## 2018-09-16 DIAGNOSIS — E1149 Type 2 diabetes mellitus with other diabetic neurological complication: Secondary | ICD-10-CM | POA: Diagnosis not present

## 2018-09-16 DIAGNOSIS — E1142 Type 2 diabetes mellitus with diabetic polyneuropathy: Secondary | ICD-10-CM | POA: Diagnosis not present

## 2018-09-16 DIAGNOSIS — I2510 Atherosclerotic heart disease of native coronary artery without angina pectoris: Secondary | ICD-10-CM | POA: Diagnosis not present

## 2018-09-16 DIAGNOSIS — I129 Hypertensive chronic kidney disease with stage 1 through stage 4 chronic kidney disease, or unspecified chronic kidney disease: Secondary | ICD-10-CM | POA: Diagnosis not present

## 2018-09-16 DIAGNOSIS — N181 Chronic kidney disease, stage 1: Secondary | ICD-10-CM | POA: Diagnosis not present

## 2018-09-17 DIAGNOSIS — Z6828 Body mass index (BMI) 28.0-28.9, adult: Secondary | ICD-10-CM | POA: Diagnosis not present

## 2018-09-17 DIAGNOSIS — T8149XA Infection following a procedure, other surgical site, initial encounter: Secondary | ICD-10-CM | POA: Diagnosis not present

## 2018-09-20 DIAGNOSIS — Z6828 Body mass index (BMI) 28.0-28.9, adult: Secondary | ICD-10-CM | POA: Diagnosis not present

## 2018-09-20 DIAGNOSIS — E1122 Type 2 diabetes mellitus with diabetic chronic kidney disease: Secondary | ICD-10-CM | POA: Diagnosis not present

## 2018-09-20 DIAGNOSIS — E1165 Type 2 diabetes mellitus with hyperglycemia: Secondary | ICD-10-CM | POA: Diagnosis not present

## 2018-09-20 DIAGNOSIS — I2510 Atherosclerotic heart disease of native coronary artery without angina pectoris: Secondary | ICD-10-CM | POA: Diagnosis not present

## 2018-09-20 DIAGNOSIS — E1151 Type 2 diabetes mellitus with diabetic peripheral angiopathy without gangrene: Secondary | ICD-10-CM | POA: Diagnosis not present

## 2018-09-20 DIAGNOSIS — I129 Hypertensive chronic kidney disease with stage 1 through stage 4 chronic kidney disease, or unspecified chronic kidney disease: Secondary | ICD-10-CM | POA: Diagnosis not present

## 2018-09-20 DIAGNOSIS — T8149XA Infection following a procedure, other surgical site, initial encounter: Secondary | ICD-10-CM | POA: Diagnosis not present

## 2018-09-20 DIAGNOSIS — E1149 Type 2 diabetes mellitus with other diabetic neurological complication: Secondary | ICD-10-CM | POA: Diagnosis not present

## 2018-09-20 DIAGNOSIS — Z4781 Encounter for orthopedic aftercare following surgical amputation: Secondary | ICD-10-CM | POA: Diagnosis not present

## 2018-09-20 DIAGNOSIS — Z2821 Immunization not carried out because of patient refusal: Secondary | ICD-10-CM | POA: Diagnosis not present

## 2018-09-20 DIAGNOSIS — E1142 Type 2 diabetes mellitus with diabetic polyneuropathy: Secondary | ICD-10-CM | POA: Diagnosis not present

## 2018-09-20 DIAGNOSIS — N181 Chronic kidney disease, stage 1: Secondary | ICD-10-CM | POA: Diagnosis not present

## 2018-09-21 DIAGNOSIS — N181 Chronic kidney disease, stage 1: Secondary | ICD-10-CM | POA: Diagnosis not present

## 2018-09-21 DIAGNOSIS — E1122 Type 2 diabetes mellitus with diabetic chronic kidney disease: Secondary | ICD-10-CM | POA: Diagnosis not present

## 2018-09-21 DIAGNOSIS — E1142 Type 2 diabetes mellitus with diabetic polyneuropathy: Secondary | ICD-10-CM | POA: Diagnosis not present

## 2018-09-21 DIAGNOSIS — Z4781 Encounter for orthopedic aftercare following surgical amputation: Secondary | ICD-10-CM | POA: Diagnosis not present

## 2018-09-21 DIAGNOSIS — E1151 Type 2 diabetes mellitus with diabetic peripheral angiopathy without gangrene: Secondary | ICD-10-CM | POA: Diagnosis not present

## 2018-09-21 DIAGNOSIS — I129 Hypertensive chronic kidney disease with stage 1 through stage 4 chronic kidney disease, or unspecified chronic kidney disease: Secondary | ICD-10-CM | POA: Diagnosis not present

## 2018-09-21 DIAGNOSIS — E1165 Type 2 diabetes mellitus with hyperglycemia: Secondary | ICD-10-CM | POA: Diagnosis not present

## 2018-09-21 DIAGNOSIS — I2510 Atherosclerotic heart disease of native coronary artery without angina pectoris: Secondary | ICD-10-CM | POA: Diagnosis not present

## 2018-09-21 DIAGNOSIS — E1149 Type 2 diabetes mellitus with other diabetic neurological complication: Secondary | ICD-10-CM | POA: Diagnosis not present

## 2018-09-22 DIAGNOSIS — E1149 Type 2 diabetes mellitus with other diabetic neurological complication: Secondary | ICD-10-CM | POA: Diagnosis not present

## 2018-09-22 DIAGNOSIS — I129 Hypertensive chronic kidney disease with stage 1 through stage 4 chronic kidney disease, or unspecified chronic kidney disease: Secondary | ICD-10-CM | POA: Diagnosis not present

## 2018-09-22 DIAGNOSIS — E1151 Type 2 diabetes mellitus with diabetic peripheral angiopathy without gangrene: Secondary | ICD-10-CM | POA: Diagnosis not present

## 2018-09-22 DIAGNOSIS — N181 Chronic kidney disease, stage 1: Secondary | ICD-10-CM | POA: Diagnosis not present

## 2018-09-22 DIAGNOSIS — E1165 Type 2 diabetes mellitus with hyperglycemia: Secondary | ICD-10-CM | POA: Diagnosis not present

## 2018-09-22 DIAGNOSIS — E1122 Type 2 diabetes mellitus with diabetic chronic kidney disease: Secondary | ICD-10-CM | POA: Diagnosis not present

## 2018-09-22 DIAGNOSIS — E1142 Type 2 diabetes mellitus with diabetic polyneuropathy: Secondary | ICD-10-CM | POA: Diagnosis not present

## 2018-09-22 DIAGNOSIS — I2510 Atherosclerotic heart disease of native coronary artery without angina pectoris: Secondary | ICD-10-CM | POA: Diagnosis not present

## 2018-09-22 DIAGNOSIS — Z4781 Encounter for orthopedic aftercare following surgical amputation: Secondary | ICD-10-CM | POA: Diagnosis not present

## 2018-09-23 DIAGNOSIS — Z89512 Acquired absence of left leg below knee: Secondary | ICD-10-CM | POA: Diagnosis not present

## 2018-09-24 DIAGNOSIS — E1151 Type 2 diabetes mellitus with diabetic peripheral angiopathy without gangrene: Secondary | ICD-10-CM | POA: Diagnosis not present

## 2018-09-24 DIAGNOSIS — I2510 Atherosclerotic heart disease of native coronary artery without angina pectoris: Secondary | ICD-10-CM | POA: Diagnosis not present

## 2018-09-24 DIAGNOSIS — E1149 Type 2 diabetes mellitus with other diabetic neurological complication: Secondary | ICD-10-CM | POA: Diagnosis not present

## 2018-09-24 DIAGNOSIS — N181 Chronic kidney disease, stage 1: Secondary | ICD-10-CM | POA: Diagnosis not present

## 2018-09-24 DIAGNOSIS — E1142 Type 2 diabetes mellitus with diabetic polyneuropathy: Secondary | ICD-10-CM | POA: Diagnosis not present

## 2018-09-24 DIAGNOSIS — I129 Hypertensive chronic kidney disease with stage 1 through stage 4 chronic kidney disease, or unspecified chronic kidney disease: Secondary | ICD-10-CM | POA: Diagnosis not present

## 2018-09-24 DIAGNOSIS — Z4781 Encounter for orthopedic aftercare following surgical amputation: Secondary | ICD-10-CM | POA: Diagnosis not present

## 2018-09-24 DIAGNOSIS — E1165 Type 2 diabetes mellitus with hyperglycemia: Secondary | ICD-10-CM | POA: Diagnosis not present

## 2018-09-24 DIAGNOSIS — E1122 Type 2 diabetes mellitus with diabetic chronic kidney disease: Secondary | ICD-10-CM | POA: Diagnosis not present

## 2018-09-27 DIAGNOSIS — E1149 Type 2 diabetes mellitus with other diabetic neurological complication: Secondary | ICD-10-CM | POA: Diagnosis not present

## 2018-09-27 DIAGNOSIS — I129 Hypertensive chronic kidney disease with stage 1 through stage 4 chronic kidney disease, or unspecified chronic kidney disease: Secondary | ICD-10-CM | POA: Diagnosis not present

## 2018-09-27 DIAGNOSIS — E1151 Type 2 diabetes mellitus with diabetic peripheral angiopathy without gangrene: Secondary | ICD-10-CM | POA: Diagnosis not present

## 2018-09-27 DIAGNOSIS — N181 Chronic kidney disease, stage 1: Secondary | ICD-10-CM | POA: Diagnosis not present

## 2018-09-27 DIAGNOSIS — I2510 Atherosclerotic heart disease of native coronary artery without angina pectoris: Secondary | ICD-10-CM | POA: Diagnosis not present

## 2018-09-27 DIAGNOSIS — E1165 Type 2 diabetes mellitus with hyperglycemia: Secondary | ICD-10-CM | POA: Diagnosis not present

## 2018-09-27 DIAGNOSIS — Z4781 Encounter for orthopedic aftercare following surgical amputation: Secondary | ICD-10-CM | POA: Diagnosis not present

## 2018-09-27 DIAGNOSIS — E1122 Type 2 diabetes mellitus with diabetic chronic kidney disease: Secondary | ICD-10-CM | POA: Diagnosis not present

## 2018-09-27 DIAGNOSIS — E1142 Type 2 diabetes mellitus with diabetic polyneuropathy: Secondary | ICD-10-CM | POA: Diagnosis not present

## 2018-09-28 DIAGNOSIS — E1149 Type 2 diabetes mellitus with other diabetic neurological complication: Secondary | ICD-10-CM | POA: Diagnosis not present

## 2018-09-28 DIAGNOSIS — E1165 Type 2 diabetes mellitus with hyperglycemia: Secondary | ICD-10-CM | POA: Diagnosis not present

## 2018-09-28 DIAGNOSIS — E1142 Type 2 diabetes mellitus with diabetic polyneuropathy: Secondary | ICD-10-CM | POA: Diagnosis not present

## 2018-09-28 DIAGNOSIS — E1151 Type 2 diabetes mellitus with diabetic peripheral angiopathy without gangrene: Secondary | ICD-10-CM | POA: Diagnosis not present

## 2018-09-28 DIAGNOSIS — I129 Hypertensive chronic kidney disease with stage 1 through stage 4 chronic kidney disease, or unspecified chronic kidney disease: Secondary | ICD-10-CM | POA: Diagnosis not present

## 2018-09-28 DIAGNOSIS — Z4781 Encounter for orthopedic aftercare following surgical amputation: Secondary | ICD-10-CM | POA: Diagnosis not present

## 2018-09-28 DIAGNOSIS — I2510 Atherosclerotic heart disease of native coronary artery without angina pectoris: Secondary | ICD-10-CM | POA: Diagnosis not present

## 2018-09-28 DIAGNOSIS — N181 Chronic kidney disease, stage 1: Secondary | ICD-10-CM | POA: Diagnosis not present

## 2018-09-28 DIAGNOSIS — E1122 Type 2 diabetes mellitus with diabetic chronic kidney disease: Secondary | ICD-10-CM | POA: Diagnosis not present

## 2018-09-29 DIAGNOSIS — E1149 Type 2 diabetes mellitus with other diabetic neurological complication: Secondary | ICD-10-CM | POA: Diagnosis not present

## 2018-09-29 DIAGNOSIS — I739 Peripheral vascular disease, unspecified: Secondary | ICD-10-CM | POA: Diagnosis not present

## 2018-09-29 DIAGNOSIS — E1122 Type 2 diabetes mellitus with diabetic chronic kidney disease: Secondary | ICD-10-CM | POA: Diagnosis not present

## 2018-09-29 DIAGNOSIS — I129 Hypertensive chronic kidney disease with stage 1 through stage 4 chronic kidney disease, or unspecified chronic kidney disease: Secondary | ICD-10-CM | POA: Diagnosis not present

## 2018-09-29 DIAGNOSIS — I2510 Atherosclerotic heart disease of native coronary artery without angina pectoris: Secondary | ICD-10-CM | POA: Diagnosis not present

## 2018-09-29 DIAGNOSIS — Z89512 Acquired absence of left leg below knee: Secondary | ICD-10-CM | POA: Diagnosis not present

## 2018-09-29 DIAGNOSIS — E1165 Type 2 diabetes mellitus with hyperglycemia: Secondary | ICD-10-CM | POA: Diagnosis not present

## 2018-09-29 DIAGNOSIS — N181 Chronic kidney disease, stage 1: Secondary | ICD-10-CM | POA: Diagnosis not present

## 2018-09-29 DIAGNOSIS — Z4781 Encounter for orthopedic aftercare following surgical amputation: Secondary | ICD-10-CM | POA: Diagnosis not present

## 2018-09-29 DIAGNOSIS — E1151 Type 2 diabetes mellitus with diabetic peripheral angiopathy without gangrene: Secondary | ICD-10-CM | POA: Diagnosis not present

## 2018-09-29 DIAGNOSIS — E1142 Type 2 diabetes mellitus with diabetic polyneuropathy: Secondary | ICD-10-CM | POA: Diagnosis not present

## 2018-09-30 DIAGNOSIS — E1122 Type 2 diabetes mellitus with diabetic chronic kidney disease: Secondary | ICD-10-CM | POA: Diagnosis not present

## 2018-09-30 DIAGNOSIS — E1149 Type 2 diabetes mellitus with other diabetic neurological complication: Secondary | ICD-10-CM | POA: Diagnosis not present

## 2018-09-30 DIAGNOSIS — E1142 Type 2 diabetes mellitus with diabetic polyneuropathy: Secondary | ICD-10-CM | POA: Diagnosis not present

## 2018-09-30 DIAGNOSIS — E1151 Type 2 diabetes mellitus with diabetic peripheral angiopathy without gangrene: Secondary | ICD-10-CM | POA: Diagnosis not present

## 2018-09-30 DIAGNOSIS — N181 Chronic kidney disease, stage 1: Secondary | ICD-10-CM | POA: Diagnosis not present

## 2018-09-30 DIAGNOSIS — I129 Hypertensive chronic kidney disease with stage 1 through stage 4 chronic kidney disease, or unspecified chronic kidney disease: Secondary | ICD-10-CM | POA: Diagnosis not present

## 2018-09-30 DIAGNOSIS — Z4781 Encounter for orthopedic aftercare following surgical amputation: Secondary | ICD-10-CM | POA: Diagnosis not present

## 2018-09-30 DIAGNOSIS — I2510 Atherosclerotic heart disease of native coronary artery without angina pectoris: Secondary | ICD-10-CM | POA: Diagnosis not present

## 2018-09-30 DIAGNOSIS — E1165 Type 2 diabetes mellitus with hyperglycemia: Secondary | ICD-10-CM | POA: Diagnosis not present

## 2018-10-01 DIAGNOSIS — E1149 Type 2 diabetes mellitus with other diabetic neurological complication: Secondary | ICD-10-CM | POA: Diagnosis not present

## 2018-10-01 DIAGNOSIS — E1151 Type 2 diabetes mellitus with diabetic peripheral angiopathy without gangrene: Secondary | ICD-10-CM | POA: Diagnosis not present

## 2018-10-01 DIAGNOSIS — I2510 Atherosclerotic heart disease of native coronary artery without angina pectoris: Secondary | ICD-10-CM | POA: Diagnosis not present

## 2018-10-01 DIAGNOSIS — I129 Hypertensive chronic kidney disease with stage 1 through stage 4 chronic kidney disease, or unspecified chronic kidney disease: Secondary | ICD-10-CM | POA: Diagnosis not present

## 2018-10-01 DIAGNOSIS — Z4781 Encounter for orthopedic aftercare following surgical amputation: Secondary | ICD-10-CM | POA: Diagnosis not present

## 2018-10-01 DIAGNOSIS — E1122 Type 2 diabetes mellitus with diabetic chronic kidney disease: Secondary | ICD-10-CM | POA: Diagnosis not present

## 2018-10-01 DIAGNOSIS — N181 Chronic kidney disease, stage 1: Secondary | ICD-10-CM | POA: Diagnosis not present

## 2018-10-01 DIAGNOSIS — E1142 Type 2 diabetes mellitus with diabetic polyneuropathy: Secondary | ICD-10-CM | POA: Diagnosis not present

## 2018-10-01 DIAGNOSIS — E1165 Type 2 diabetes mellitus with hyperglycemia: Secondary | ICD-10-CM | POA: Diagnosis not present

## 2018-10-04 DIAGNOSIS — T8149XA Infection following a procedure, other surgical site, initial encounter: Secondary | ICD-10-CM | POA: Diagnosis not present

## 2018-10-04 DIAGNOSIS — Z6828 Body mass index (BMI) 28.0-28.9, adult: Secondary | ICD-10-CM | POA: Diagnosis not present

## 2018-10-04 DIAGNOSIS — E1122 Type 2 diabetes mellitus with diabetic chronic kidney disease: Secondary | ICD-10-CM | POA: Diagnosis not present

## 2018-10-04 DIAGNOSIS — E1149 Type 2 diabetes mellitus with other diabetic neurological complication: Secondary | ICD-10-CM | POA: Diagnosis not present

## 2018-10-04 DIAGNOSIS — E1165 Type 2 diabetes mellitus with hyperglycemia: Secondary | ICD-10-CM | POA: Diagnosis not present

## 2018-10-04 DIAGNOSIS — I2510 Atherosclerotic heart disease of native coronary artery without angina pectoris: Secondary | ICD-10-CM | POA: Diagnosis not present

## 2018-10-04 DIAGNOSIS — E1151 Type 2 diabetes mellitus with diabetic peripheral angiopathy without gangrene: Secondary | ICD-10-CM | POA: Diagnosis not present

## 2018-10-04 DIAGNOSIS — I129 Hypertensive chronic kidney disease with stage 1 through stage 4 chronic kidney disease, or unspecified chronic kidney disease: Secondary | ICD-10-CM | POA: Diagnosis not present

## 2018-10-04 DIAGNOSIS — E1142 Type 2 diabetes mellitus with diabetic polyneuropathy: Secondary | ICD-10-CM | POA: Diagnosis not present

## 2018-10-04 DIAGNOSIS — N181 Chronic kidney disease, stage 1: Secondary | ICD-10-CM | POA: Diagnosis not present

## 2018-10-04 DIAGNOSIS — Z4781 Encounter for orthopedic aftercare following surgical amputation: Secondary | ICD-10-CM | POA: Diagnosis not present

## 2018-10-06 DIAGNOSIS — I129 Hypertensive chronic kidney disease with stage 1 through stage 4 chronic kidney disease, or unspecified chronic kidney disease: Secondary | ICD-10-CM | POA: Diagnosis not present

## 2018-10-06 DIAGNOSIS — I2510 Atherosclerotic heart disease of native coronary artery without angina pectoris: Secondary | ICD-10-CM | POA: Diagnosis not present

## 2018-10-06 DIAGNOSIS — E1149 Type 2 diabetes mellitus with other diabetic neurological complication: Secondary | ICD-10-CM | POA: Diagnosis not present

## 2018-10-06 DIAGNOSIS — N181 Chronic kidney disease, stage 1: Secondary | ICD-10-CM | POA: Diagnosis not present

## 2018-10-06 DIAGNOSIS — Z4781 Encounter for orthopedic aftercare following surgical amputation: Secondary | ICD-10-CM | POA: Diagnosis not present

## 2018-10-06 DIAGNOSIS — E1122 Type 2 diabetes mellitus with diabetic chronic kidney disease: Secondary | ICD-10-CM | POA: Diagnosis not present

## 2018-10-06 DIAGNOSIS — E1151 Type 2 diabetes mellitus with diabetic peripheral angiopathy without gangrene: Secondary | ICD-10-CM | POA: Diagnosis not present

## 2018-10-06 DIAGNOSIS — E1165 Type 2 diabetes mellitus with hyperglycemia: Secondary | ICD-10-CM | POA: Diagnosis not present

## 2018-10-06 DIAGNOSIS — E1142 Type 2 diabetes mellitus with diabetic polyneuropathy: Secondary | ICD-10-CM | POA: Diagnosis not present

## 2018-10-07 DIAGNOSIS — E1165 Type 2 diabetes mellitus with hyperglycemia: Secondary | ICD-10-CM | POA: Diagnosis not present

## 2018-10-07 DIAGNOSIS — E1142 Type 2 diabetes mellitus with diabetic polyneuropathy: Secondary | ICD-10-CM | POA: Diagnosis not present

## 2018-10-07 DIAGNOSIS — I129 Hypertensive chronic kidney disease with stage 1 through stage 4 chronic kidney disease, or unspecified chronic kidney disease: Secondary | ICD-10-CM | POA: Diagnosis not present

## 2018-10-07 DIAGNOSIS — E1149 Type 2 diabetes mellitus with other diabetic neurological complication: Secondary | ICD-10-CM | POA: Diagnosis not present

## 2018-10-07 DIAGNOSIS — Z4781 Encounter for orthopedic aftercare following surgical amputation: Secondary | ICD-10-CM | POA: Diagnosis not present

## 2018-10-07 DIAGNOSIS — E1122 Type 2 diabetes mellitus with diabetic chronic kidney disease: Secondary | ICD-10-CM | POA: Diagnosis not present

## 2018-10-07 DIAGNOSIS — E1151 Type 2 diabetes mellitus with diabetic peripheral angiopathy without gangrene: Secondary | ICD-10-CM | POA: Diagnosis not present

## 2018-10-07 DIAGNOSIS — I2510 Atherosclerotic heart disease of native coronary artery without angina pectoris: Secondary | ICD-10-CM | POA: Diagnosis not present

## 2018-10-07 DIAGNOSIS — N181 Chronic kidney disease, stage 1: Secondary | ICD-10-CM | POA: Diagnosis not present

## 2018-10-08 DIAGNOSIS — E1165 Type 2 diabetes mellitus with hyperglycemia: Secondary | ICD-10-CM | POA: Diagnosis not present

## 2018-10-08 DIAGNOSIS — E1142 Type 2 diabetes mellitus with diabetic polyneuropathy: Secondary | ICD-10-CM | POA: Diagnosis not present

## 2018-10-08 DIAGNOSIS — N181 Chronic kidney disease, stage 1: Secondary | ICD-10-CM | POA: Diagnosis not present

## 2018-10-08 DIAGNOSIS — E1122 Type 2 diabetes mellitus with diabetic chronic kidney disease: Secondary | ICD-10-CM | POA: Diagnosis not present

## 2018-10-08 DIAGNOSIS — I129 Hypertensive chronic kidney disease with stage 1 through stage 4 chronic kidney disease, or unspecified chronic kidney disease: Secondary | ICD-10-CM | POA: Diagnosis not present

## 2018-10-08 DIAGNOSIS — Z4781 Encounter for orthopedic aftercare following surgical amputation: Secondary | ICD-10-CM | POA: Diagnosis not present

## 2018-10-08 DIAGNOSIS — E1151 Type 2 diabetes mellitus with diabetic peripheral angiopathy without gangrene: Secondary | ICD-10-CM | POA: Diagnosis not present

## 2018-10-08 DIAGNOSIS — I2510 Atherosclerotic heart disease of native coronary artery without angina pectoris: Secondary | ICD-10-CM | POA: Diagnosis not present

## 2018-10-08 DIAGNOSIS — E1149 Type 2 diabetes mellitus with other diabetic neurological complication: Secondary | ICD-10-CM | POA: Diagnosis not present

## 2018-10-11 DIAGNOSIS — E1152 Type 2 diabetes mellitus with diabetic peripheral angiopathy with gangrene: Secondary | ICD-10-CM | POA: Diagnosis not present

## 2018-10-11 DIAGNOSIS — E1122 Type 2 diabetes mellitus with diabetic chronic kidney disease: Secondary | ICD-10-CM | POA: Diagnosis not present

## 2018-10-11 DIAGNOSIS — I129 Hypertensive chronic kidney disease with stage 1 through stage 4 chronic kidney disease, or unspecified chronic kidney disease: Secondary | ICD-10-CM | POA: Diagnosis not present

## 2018-10-11 DIAGNOSIS — E11622 Type 2 diabetes mellitus with other skin ulcer: Secondary | ICD-10-CM | POA: Diagnosis not present

## 2018-10-11 DIAGNOSIS — I2510 Atherosclerotic heart disease of native coronary artery without angina pectoris: Secondary | ICD-10-CM | POA: Diagnosis not present

## 2018-10-11 DIAGNOSIS — E1151 Type 2 diabetes mellitus with diabetic peripheral angiopathy without gangrene: Secondary | ICD-10-CM | POA: Diagnosis not present

## 2018-10-11 DIAGNOSIS — E1142 Type 2 diabetes mellitus with diabetic polyneuropathy: Secondary | ICD-10-CM | POA: Diagnosis not present

## 2018-10-11 DIAGNOSIS — T8789 Other complications of amputation stump: Secondary | ICD-10-CM | POA: Diagnosis not present

## 2018-10-11 DIAGNOSIS — E1165 Type 2 diabetes mellitus with hyperglycemia: Secondary | ICD-10-CM | POA: Diagnosis not present

## 2018-10-11 DIAGNOSIS — T8754 Necrosis of amputation stump, left lower extremity: Secondary | ICD-10-CM | POA: Diagnosis not present

## 2018-10-11 DIAGNOSIS — E1149 Type 2 diabetes mellitus with other diabetic neurological complication: Secondary | ICD-10-CM | POA: Diagnosis not present

## 2018-10-11 DIAGNOSIS — N181 Chronic kidney disease, stage 1: Secondary | ICD-10-CM | POA: Diagnosis not present

## 2018-10-11 DIAGNOSIS — Z4781 Encounter for orthopedic aftercare following surgical amputation: Secondary | ICD-10-CM | POA: Diagnosis not present

## 2018-10-11 DIAGNOSIS — T8781 Dehiscence of amputation stump: Secondary | ICD-10-CM | POA: Diagnosis not present

## 2018-10-11 DIAGNOSIS — L97223 Non-pressure chronic ulcer of left calf with necrosis of muscle: Secondary | ICD-10-CM | POA: Diagnosis not present

## 2018-10-12 DIAGNOSIS — E1165 Type 2 diabetes mellitus with hyperglycemia: Secondary | ICD-10-CM | POA: Diagnosis not present

## 2018-10-12 DIAGNOSIS — E1149 Type 2 diabetes mellitus with other diabetic neurological complication: Secondary | ICD-10-CM | POA: Diagnosis not present

## 2018-10-12 DIAGNOSIS — Z4781 Encounter for orthopedic aftercare following surgical amputation: Secondary | ICD-10-CM | POA: Diagnosis not present

## 2018-10-12 DIAGNOSIS — E1151 Type 2 diabetes mellitus with diabetic peripheral angiopathy without gangrene: Secondary | ICD-10-CM | POA: Diagnosis not present

## 2018-10-12 DIAGNOSIS — N181 Chronic kidney disease, stage 1: Secondary | ICD-10-CM | POA: Diagnosis not present

## 2018-10-12 DIAGNOSIS — I129 Hypertensive chronic kidney disease with stage 1 through stage 4 chronic kidney disease, or unspecified chronic kidney disease: Secondary | ICD-10-CM | POA: Diagnosis not present

## 2018-10-12 DIAGNOSIS — E1122 Type 2 diabetes mellitus with diabetic chronic kidney disease: Secondary | ICD-10-CM | POA: Diagnosis not present

## 2018-10-12 DIAGNOSIS — E1142 Type 2 diabetes mellitus with diabetic polyneuropathy: Secondary | ICD-10-CM | POA: Diagnosis not present

## 2018-10-12 DIAGNOSIS — I2510 Atherosclerotic heart disease of native coronary artery without angina pectoris: Secondary | ICD-10-CM | POA: Diagnosis not present

## 2018-10-13 DIAGNOSIS — I129 Hypertensive chronic kidney disease with stage 1 through stage 4 chronic kidney disease, or unspecified chronic kidney disease: Secondary | ICD-10-CM | POA: Diagnosis not present

## 2018-10-13 DIAGNOSIS — N181 Chronic kidney disease, stage 1: Secondary | ICD-10-CM | POA: Diagnosis not present

## 2018-10-13 DIAGNOSIS — E1151 Type 2 diabetes mellitus with diabetic peripheral angiopathy without gangrene: Secondary | ICD-10-CM | POA: Diagnosis not present

## 2018-10-13 DIAGNOSIS — E1165 Type 2 diabetes mellitus with hyperglycemia: Secondary | ICD-10-CM | POA: Diagnosis not present

## 2018-10-13 DIAGNOSIS — Z4781 Encounter for orthopedic aftercare following surgical amputation: Secondary | ICD-10-CM | POA: Diagnosis not present

## 2018-10-13 DIAGNOSIS — E1149 Type 2 diabetes mellitus with other diabetic neurological complication: Secondary | ICD-10-CM | POA: Diagnosis not present

## 2018-10-13 DIAGNOSIS — E1142 Type 2 diabetes mellitus with diabetic polyneuropathy: Secondary | ICD-10-CM | POA: Diagnosis not present

## 2018-10-13 DIAGNOSIS — I2510 Atherosclerotic heart disease of native coronary artery without angina pectoris: Secondary | ICD-10-CM | POA: Diagnosis not present

## 2018-10-13 DIAGNOSIS — E1122 Type 2 diabetes mellitus with diabetic chronic kidney disease: Secondary | ICD-10-CM | POA: Diagnosis not present

## 2018-10-14 DIAGNOSIS — E1151 Type 2 diabetes mellitus with diabetic peripheral angiopathy without gangrene: Secondary | ICD-10-CM | POA: Diagnosis not present

## 2018-10-14 DIAGNOSIS — E1149 Type 2 diabetes mellitus with other diabetic neurological complication: Secondary | ICD-10-CM | POA: Diagnosis not present

## 2018-10-14 DIAGNOSIS — E1165 Type 2 diabetes mellitus with hyperglycemia: Secondary | ICD-10-CM | POA: Diagnosis not present

## 2018-10-14 DIAGNOSIS — E1122 Type 2 diabetes mellitus with diabetic chronic kidney disease: Secondary | ICD-10-CM | POA: Diagnosis not present

## 2018-10-14 DIAGNOSIS — Z4781 Encounter for orthopedic aftercare following surgical amputation: Secondary | ICD-10-CM | POA: Diagnosis not present

## 2018-10-14 DIAGNOSIS — E1142 Type 2 diabetes mellitus with diabetic polyneuropathy: Secondary | ICD-10-CM | POA: Diagnosis not present

## 2018-10-14 DIAGNOSIS — I129 Hypertensive chronic kidney disease with stage 1 through stage 4 chronic kidney disease, or unspecified chronic kidney disease: Secondary | ICD-10-CM | POA: Diagnosis not present

## 2018-10-14 DIAGNOSIS — N181 Chronic kidney disease, stage 1: Secondary | ICD-10-CM | POA: Diagnosis not present

## 2018-10-14 DIAGNOSIS — I2510 Atherosclerotic heart disease of native coronary artery without angina pectoris: Secondary | ICD-10-CM | POA: Diagnosis not present

## 2018-10-15 DIAGNOSIS — E1122 Type 2 diabetes mellitus with diabetic chronic kidney disease: Secondary | ICD-10-CM | POA: Diagnosis not present

## 2018-10-15 DIAGNOSIS — I2510 Atherosclerotic heart disease of native coronary artery without angina pectoris: Secondary | ICD-10-CM | POA: Diagnosis not present

## 2018-10-15 DIAGNOSIS — I129 Hypertensive chronic kidney disease with stage 1 through stage 4 chronic kidney disease, or unspecified chronic kidney disease: Secondary | ICD-10-CM | POA: Diagnosis not present

## 2018-10-15 DIAGNOSIS — E1151 Type 2 diabetes mellitus with diabetic peripheral angiopathy without gangrene: Secondary | ICD-10-CM | POA: Diagnosis not present

## 2018-10-15 DIAGNOSIS — E1149 Type 2 diabetes mellitus with other diabetic neurological complication: Secondary | ICD-10-CM | POA: Diagnosis not present

## 2018-10-15 DIAGNOSIS — E1142 Type 2 diabetes mellitus with diabetic polyneuropathy: Secondary | ICD-10-CM | POA: Diagnosis not present

## 2018-10-15 DIAGNOSIS — Z4781 Encounter for orthopedic aftercare following surgical amputation: Secondary | ICD-10-CM | POA: Diagnosis not present

## 2018-10-15 DIAGNOSIS — Z89512 Acquired absence of left leg below knee: Secondary | ICD-10-CM | POA: Diagnosis not present

## 2018-10-15 DIAGNOSIS — N181 Chronic kidney disease, stage 1: Secondary | ICD-10-CM | POA: Diagnosis not present

## 2018-10-15 DIAGNOSIS — E1165 Type 2 diabetes mellitus with hyperglycemia: Secondary | ICD-10-CM | POA: Diagnosis not present

## 2018-10-18 DIAGNOSIS — E1151 Type 2 diabetes mellitus with diabetic peripheral angiopathy without gangrene: Secondary | ICD-10-CM | POA: Diagnosis not present

## 2018-10-18 DIAGNOSIS — I2510 Atherosclerotic heart disease of native coronary artery without angina pectoris: Secondary | ICD-10-CM | POA: Diagnosis not present

## 2018-10-18 DIAGNOSIS — E1149 Type 2 diabetes mellitus with other diabetic neurological complication: Secondary | ICD-10-CM | POA: Diagnosis not present

## 2018-10-18 DIAGNOSIS — Z4781 Encounter for orthopedic aftercare following surgical amputation: Secondary | ICD-10-CM | POA: Diagnosis not present

## 2018-10-18 DIAGNOSIS — I129 Hypertensive chronic kidney disease with stage 1 through stage 4 chronic kidney disease, or unspecified chronic kidney disease: Secondary | ICD-10-CM | POA: Diagnosis not present

## 2018-10-18 DIAGNOSIS — E1165 Type 2 diabetes mellitus with hyperglycemia: Secondary | ICD-10-CM | POA: Diagnosis not present

## 2018-10-18 DIAGNOSIS — N181 Chronic kidney disease, stage 1: Secondary | ICD-10-CM | POA: Diagnosis not present

## 2018-10-18 DIAGNOSIS — E1142 Type 2 diabetes mellitus with diabetic polyneuropathy: Secondary | ICD-10-CM | POA: Diagnosis not present

## 2018-10-18 DIAGNOSIS — E1122 Type 2 diabetes mellitus with diabetic chronic kidney disease: Secondary | ICD-10-CM | POA: Diagnosis not present

## 2018-10-19 DIAGNOSIS — Z4781 Encounter for orthopedic aftercare following surgical amputation: Secondary | ICD-10-CM | POA: Diagnosis not present

## 2018-10-19 DIAGNOSIS — I129 Hypertensive chronic kidney disease with stage 1 through stage 4 chronic kidney disease, or unspecified chronic kidney disease: Secondary | ICD-10-CM | POA: Diagnosis not present

## 2018-10-19 DIAGNOSIS — I2510 Atherosclerotic heart disease of native coronary artery without angina pectoris: Secondary | ICD-10-CM | POA: Diagnosis not present

## 2018-10-19 DIAGNOSIS — N181 Chronic kidney disease, stage 1: Secondary | ICD-10-CM | POA: Diagnosis not present

## 2018-10-19 DIAGNOSIS — E1151 Type 2 diabetes mellitus with diabetic peripheral angiopathy without gangrene: Secondary | ICD-10-CM | POA: Diagnosis not present

## 2018-10-19 DIAGNOSIS — E1149 Type 2 diabetes mellitus with other diabetic neurological complication: Secondary | ICD-10-CM | POA: Diagnosis not present

## 2018-10-19 DIAGNOSIS — E1165 Type 2 diabetes mellitus with hyperglycemia: Secondary | ICD-10-CM | POA: Diagnosis not present

## 2018-10-19 DIAGNOSIS — E1122 Type 2 diabetes mellitus with diabetic chronic kidney disease: Secondary | ICD-10-CM | POA: Diagnosis not present

## 2018-10-19 DIAGNOSIS — E1142 Type 2 diabetes mellitus with diabetic polyneuropathy: Secondary | ICD-10-CM | POA: Diagnosis not present

## 2018-10-20 DIAGNOSIS — E1149 Type 2 diabetes mellitus with other diabetic neurological complication: Secondary | ICD-10-CM | POA: Diagnosis not present

## 2018-10-20 DIAGNOSIS — E1151 Type 2 diabetes mellitus with diabetic peripheral angiopathy without gangrene: Secondary | ICD-10-CM | POA: Diagnosis not present

## 2018-10-20 DIAGNOSIS — Z4781 Encounter for orthopedic aftercare following surgical amputation: Secondary | ICD-10-CM | POA: Diagnosis not present

## 2018-10-20 DIAGNOSIS — I2510 Atherosclerotic heart disease of native coronary artery without angina pectoris: Secondary | ICD-10-CM | POA: Diagnosis not present

## 2018-10-20 DIAGNOSIS — N181 Chronic kidney disease, stage 1: Secondary | ICD-10-CM | POA: Diagnosis not present

## 2018-10-20 DIAGNOSIS — E1165 Type 2 diabetes mellitus with hyperglycemia: Secondary | ICD-10-CM | POA: Diagnosis not present

## 2018-10-20 DIAGNOSIS — E1122 Type 2 diabetes mellitus with diabetic chronic kidney disease: Secondary | ICD-10-CM | POA: Diagnosis not present

## 2018-10-20 DIAGNOSIS — I129 Hypertensive chronic kidney disease with stage 1 through stage 4 chronic kidney disease, or unspecified chronic kidney disease: Secondary | ICD-10-CM | POA: Diagnosis not present

## 2018-10-20 DIAGNOSIS — E1142 Type 2 diabetes mellitus with diabetic polyneuropathy: Secondary | ICD-10-CM | POA: Diagnosis not present

## 2018-10-22 DIAGNOSIS — I2510 Atherosclerotic heart disease of native coronary artery without angina pectoris: Secondary | ICD-10-CM | POA: Diagnosis not present

## 2018-10-22 DIAGNOSIS — Z4781 Encounter for orthopedic aftercare following surgical amputation: Secondary | ICD-10-CM | POA: Diagnosis not present

## 2018-10-22 DIAGNOSIS — I129 Hypertensive chronic kidney disease with stage 1 through stage 4 chronic kidney disease, or unspecified chronic kidney disease: Secondary | ICD-10-CM | POA: Diagnosis not present

## 2018-10-22 DIAGNOSIS — N181 Chronic kidney disease, stage 1: Secondary | ICD-10-CM | POA: Diagnosis not present

## 2018-10-22 DIAGNOSIS — E1149 Type 2 diabetes mellitus with other diabetic neurological complication: Secondary | ICD-10-CM | POA: Diagnosis not present

## 2018-10-22 DIAGNOSIS — E1151 Type 2 diabetes mellitus with diabetic peripheral angiopathy without gangrene: Secondary | ICD-10-CM | POA: Diagnosis not present

## 2018-10-22 DIAGNOSIS — E1165 Type 2 diabetes mellitus with hyperglycemia: Secondary | ICD-10-CM | POA: Diagnosis not present

## 2018-10-22 DIAGNOSIS — E1122 Type 2 diabetes mellitus with diabetic chronic kidney disease: Secondary | ICD-10-CM | POA: Diagnosis not present

## 2018-10-22 DIAGNOSIS — E1142 Type 2 diabetes mellitus with diabetic polyneuropathy: Secondary | ICD-10-CM | POA: Diagnosis not present

## 2018-10-25 DIAGNOSIS — E1165 Type 2 diabetes mellitus with hyperglycemia: Secondary | ICD-10-CM | POA: Diagnosis not present

## 2018-10-25 DIAGNOSIS — E1142 Type 2 diabetes mellitus with diabetic polyneuropathy: Secondary | ICD-10-CM | POA: Diagnosis not present

## 2018-10-25 DIAGNOSIS — I129 Hypertensive chronic kidney disease with stage 1 through stage 4 chronic kidney disease, or unspecified chronic kidney disease: Secondary | ICD-10-CM | POA: Diagnosis not present

## 2018-10-25 DIAGNOSIS — E1149 Type 2 diabetes mellitus with other diabetic neurological complication: Secondary | ICD-10-CM | POA: Diagnosis not present

## 2018-10-25 DIAGNOSIS — E1151 Type 2 diabetes mellitus with diabetic peripheral angiopathy without gangrene: Secondary | ICD-10-CM | POA: Diagnosis not present

## 2018-10-25 DIAGNOSIS — I2510 Atherosclerotic heart disease of native coronary artery without angina pectoris: Secondary | ICD-10-CM | POA: Diagnosis not present

## 2018-10-25 DIAGNOSIS — N181 Chronic kidney disease, stage 1: Secondary | ICD-10-CM | POA: Diagnosis not present

## 2018-10-25 DIAGNOSIS — Z4781 Encounter for orthopedic aftercare following surgical amputation: Secondary | ICD-10-CM | POA: Diagnosis not present

## 2018-10-25 DIAGNOSIS — E1122 Type 2 diabetes mellitus with diabetic chronic kidney disease: Secondary | ICD-10-CM | POA: Diagnosis not present

## 2018-10-26 DIAGNOSIS — Z4781 Encounter for orthopedic aftercare following surgical amputation: Secondary | ICD-10-CM | POA: Diagnosis not present

## 2018-10-26 DIAGNOSIS — N181 Chronic kidney disease, stage 1: Secondary | ICD-10-CM | POA: Diagnosis not present

## 2018-10-26 DIAGNOSIS — E1165 Type 2 diabetes mellitus with hyperglycemia: Secondary | ICD-10-CM | POA: Diagnosis not present

## 2018-10-26 DIAGNOSIS — E1142 Type 2 diabetes mellitus with diabetic polyneuropathy: Secondary | ICD-10-CM | POA: Diagnosis not present

## 2018-10-26 DIAGNOSIS — E1151 Type 2 diabetes mellitus with diabetic peripheral angiopathy without gangrene: Secondary | ICD-10-CM | POA: Diagnosis not present

## 2018-10-26 DIAGNOSIS — E1122 Type 2 diabetes mellitus with diabetic chronic kidney disease: Secondary | ICD-10-CM | POA: Diagnosis not present

## 2018-10-26 DIAGNOSIS — E1149 Type 2 diabetes mellitus with other diabetic neurological complication: Secondary | ICD-10-CM | POA: Diagnosis not present

## 2018-10-26 DIAGNOSIS — I129 Hypertensive chronic kidney disease with stage 1 through stage 4 chronic kidney disease, or unspecified chronic kidney disease: Secondary | ICD-10-CM | POA: Diagnosis not present

## 2018-10-26 DIAGNOSIS — I2510 Atherosclerotic heart disease of native coronary artery without angina pectoris: Secondary | ICD-10-CM | POA: Diagnosis not present

## 2018-10-27 DIAGNOSIS — E1122 Type 2 diabetes mellitus with diabetic chronic kidney disease: Secondary | ICD-10-CM | POA: Diagnosis not present

## 2018-10-27 DIAGNOSIS — E1151 Type 2 diabetes mellitus with diabetic peripheral angiopathy without gangrene: Secondary | ICD-10-CM | POA: Diagnosis not present

## 2018-10-27 DIAGNOSIS — Z4781 Encounter for orthopedic aftercare following surgical amputation: Secondary | ICD-10-CM | POA: Diagnosis not present

## 2018-10-27 DIAGNOSIS — E1165 Type 2 diabetes mellitus with hyperglycemia: Secondary | ICD-10-CM | POA: Diagnosis not present

## 2018-10-27 DIAGNOSIS — N181 Chronic kidney disease, stage 1: Secondary | ICD-10-CM | POA: Diagnosis not present

## 2018-10-27 DIAGNOSIS — E1142 Type 2 diabetes mellitus with diabetic polyneuropathy: Secondary | ICD-10-CM | POA: Diagnosis not present

## 2018-10-27 DIAGNOSIS — E1149 Type 2 diabetes mellitus with other diabetic neurological complication: Secondary | ICD-10-CM | POA: Diagnosis not present

## 2018-10-27 DIAGNOSIS — I129 Hypertensive chronic kidney disease with stage 1 through stage 4 chronic kidney disease, or unspecified chronic kidney disease: Secondary | ICD-10-CM | POA: Diagnosis not present

## 2018-10-27 DIAGNOSIS — I2510 Atherosclerotic heart disease of native coronary artery without angina pectoris: Secondary | ICD-10-CM | POA: Diagnosis not present

## 2018-10-28 DIAGNOSIS — E1151 Type 2 diabetes mellitus with diabetic peripheral angiopathy without gangrene: Secondary | ICD-10-CM | POA: Diagnosis not present

## 2018-10-28 DIAGNOSIS — I129 Hypertensive chronic kidney disease with stage 1 through stage 4 chronic kidney disease, or unspecified chronic kidney disease: Secondary | ICD-10-CM | POA: Diagnosis not present

## 2018-10-28 DIAGNOSIS — N181 Chronic kidney disease, stage 1: Secondary | ICD-10-CM | POA: Diagnosis not present

## 2018-10-28 DIAGNOSIS — E1165 Type 2 diabetes mellitus with hyperglycemia: Secondary | ICD-10-CM | POA: Diagnosis not present

## 2018-10-28 DIAGNOSIS — Z4781 Encounter for orthopedic aftercare following surgical amputation: Secondary | ICD-10-CM | POA: Diagnosis not present

## 2018-10-28 DIAGNOSIS — E1142 Type 2 diabetes mellitus with diabetic polyneuropathy: Secondary | ICD-10-CM | POA: Diagnosis not present

## 2018-10-28 DIAGNOSIS — I2510 Atherosclerotic heart disease of native coronary artery without angina pectoris: Secondary | ICD-10-CM | POA: Diagnosis not present

## 2018-10-28 DIAGNOSIS — E1122 Type 2 diabetes mellitus with diabetic chronic kidney disease: Secondary | ICD-10-CM | POA: Diagnosis not present

## 2018-10-28 DIAGNOSIS — E1149 Type 2 diabetes mellitus with other diabetic neurological complication: Secondary | ICD-10-CM | POA: Diagnosis not present

## 2018-10-29 DIAGNOSIS — E1140 Type 2 diabetes mellitus with diabetic neuropathy, unspecified: Secondary | ICD-10-CM | POA: Diagnosis not present

## 2018-10-29 DIAGNOSIS — T8789 Other complications of amputation stump: Secondary | ICD-10-CM | POA: Diagnosis not present

## 2018-10-29 DIAGNOSIS — L97223 Non-pressure chronic ulcer of left calf with necrosis of muscle: Secondary | ICD-10-CM | POA: Diagnosis not present

## 2018-10-29 DIAGNOSIS — E1152 Type 2 diabetes mellitus with diabetic peripheral angiopathy with gangrene: Secondary | ICD-10-CM | POA: Diagnosis not present

## 2018-10-29 DIAGNOSIS — Z89512 Acquired absence of left leg below knee: Secondary | ICD-10-CM | POA: Diagnosis not present

## 2018-10-29 DIAGNOSIS — E11622 Type 2 diabetes mellitus with other skin ulcer: Secondary | ICD-10-CM | POA: Diagnosis not present

## 2018-10-29 DIAGNOSIS — E1342 Other specified diabetes mellitus with diabetic polyneuropathy: Secondary | ICD-10-CM | POA: Diagnosis not present

## 2018-10-29 DIAGNOSIS — T8781 Dehiscence of amputation stump: Secondary | ICD-10-CM | POA: Diagnosis not present

## 2018-10-29 DIAGNOSIS — T8754 Necrosis of amputation stump, left lower extremity: Secondary | ICD-10-CM | POA: Diagnosis not present

## 2018-10-30 DIAGNOSIS — Z89512 Acquired absence of left leg below knee: Secondary | ICD-10-CM | POA: Diagnosis not present

## 2018-11-01 DIAGNOSIS — I129 Hypertensive chronic kidney disease with stage 1 through stage 4 chronic kidney disease, or unspecified chronic kidney disease: Secondary | ICD-10-CM | POA: Diagnosis not present

## 2018-11-01 DIAGNOSIS — N181 Chronic kidney disease, stage 1: Secondary | ICD-10-CM | POA: Diagnosis not present

## 2018-11-01 DIAGNOSIS — E1149 Type 2 diabetes mellitus with other diabetic neurological complication: Secondary | ICD-10-CM | POA: Diagnosis not present

## 2018-11-01 DIAGNOSIS — E1122 Type 2 diabetes mellitus with diabetic chronic kidney disease: Secondary | ICD-10-CM | POA: Diagnosis not present

## 2018-11-01 DIAGNOSIS — E1165 Type 2 diabetes mellitus with hyperglycemia: Secondary | ICD-10-CM | POA: Diagnosis not present

## 2018-11-01 DIAGNOSIS — E1142 Type 2 diabetes mellitus with diabetic polyneuropathy: Secondary | ICD-10-CM | POA: Diagnosis not present

## 2018-11-01 DIAGNOSIS — Z4781 Encounter for orthopedic aftercare following surgical amputation: Secondary | ICD-10-CM | POA: Diagnosis not present

## 2018-11-01 DIAGNOSIS — E1151 Type 2 diabetes mellitus with diabetic peripheral angiopathy without gangrene: Secondary | ICD-10-CM | POA: Diagnosis not present

## 2018-11-01 DIAGNOSIS — I2510 Atherosclerotic heart disease of native coronary artery without angina pectoris: Secondary | ICD-10-CM | POA: Diagnosis not present

## 2018-11-03 DIAGNOSIS — E1122 Type 2 diabetes mellitus with diabetic chronic kidney disease: Secondary | ICD-10-CM | POA: Diagnosis not present

## 2018-11-03 DIAGNOSIS — Z4781 Encounter for orthopedic aftercare following surgical amputation: Secondary | ICD-10-CM | POA: Diagnosis not present

## 2018-11-03 DIAGNOSIS — I129 Hypertensive chronic kidney disease with stage 1 through stage 4 chronic kidney disease, or unspecified chronic kidney disease: Secondary | ICD-10-CM | POA: Diagnosis not present

## 2018-11-03 DIAGNOSIS — E1149 Type 2 diabetes mellitus with other diabetic neurological complication: Secondary | ICD-10-CM | POA: Diagnosis not present

## 2018-11-03 DIAGNOSIS — N181 Chronic kidney disease, stage 1: Secondary | ICD-10-CM | POA: Diagnosis not present

## 2018-11-03 DIAGNOSIS — E1165 Type 2 diabetes mellitus with hyperglycemia: Secondary | ICD-10-CM | POA: Diagnosis not present

## 2018-11-03 DIAGNOSIS — E1142 Type 2 diabetes mellitus with diabetic polyneuropathy: Secondary | ICD-10-CM | POA: Diagnosis not present

## 2018-11-03 DIAGNOSIS — E1151 Type 2 diabetes mellitus with diabetic peripheral angiopathy without gangrene: Secondary | ICD-10-CM | POA: Diagnosis not present

## 2018-11-03 DIAGNOSIS — I2510 Atherosclerotic heart disease of native coronary artery without angina pectoris: Secondary | ICD-10-CM | POA: Diagnosis not present

## 2018-11-05 DIAGNOSIS — E1149 Type 2 diabetes mellitus with other diabetic neurological complication: Secondary | ICD-10-CM | POA: Diagnosis not present

## 2018-11-05 DIAGNOSIS — E1151 Type 2 diabetes mellitus with diabetic peripheral angiopathy without gangrene: Secondary | ICD-10-CM | POA: Diagnosis not present

## 2018-11-05 DIAGNOSIS — E1142 Type 2 diabetes mellitus with diabetic polyneuropathy: Secondary | ICD-10-CM | POA: Diagnosis not present

## 2018-11-05 DIAGNOSIS — I129 Hypertensive chronic kidney disease with stage 1 through stage 4 chronic kidney disease, or unspecified chronic kidney disease: Secondary | ICD-10-CM | POA: Diagnosis not present

## 2018-11-05 DIAGNOSIS — N181 Chronic kidney disease, stage 1: Secondary | ICD-10-CM | POA: Diagnosis not present

## 2018-11-05 DIAGNOSIS — E1165 Type 2 diabetes mellitus with hyperglycemia: Secondary | ICD-10-CM | POA: Diagnosis not present

## 2018-11-05 DIAGNOSIS — Z4781 Encounter for orthopedic aftercare following surgical amputation: Secondary | ICD-10-CM | POA: Diagnosis not present

## 2018-11-05 DIAGNOSIS — E1122 Type 2 diabetes mellitus with diabetic chronic kidney disease: Secondary | ICD-10-CM | POA: Diagnosis not present

## 2018-11-05 DIAGNOSIS — I2510 Atherosclerotic heart disease of native coronary artery without angina pectoris: Secondary | ICD-10-CM | POA: Diagnosis not present

## 2018-11-08 DIAGNOSIS — N181 Chronic kidney disease, stage 1: Secondary | ICD-10-CM | POA: Diagnosis not present

## 2018-11-08 DIAGNOSIS — I2510 Atherosclerotic heart disease of native coronary artery without angina pectoris: Secondary | ICD-10-CM | POA: Diagnosis not present

## 2018-11-08 DIAGNOSIS — E1151 Type 2 diabetes mellitus with diabetic peripheral angiopathy without gangrene: Secondary | ICD-10-CM | POA: Diagnosis not present

## 2018-11-08 DIAGNOSIS — I129 Hypertensive chronic kidney disease with stage 1 through stage 4 chronic kidney disease, or unspecified chronic kidney disease: Secondary | ICD-10-CM | POA: Diagnosis not present

## 2018-11-08 DIAGNOSIS — Z4781 Encounter for orthopedic aftercare following surgical amputation: Secondary | ICD-10-CM | POA: Diagnosis not present

## 2018-11-08 DIAGNOSIS — E1149 Type 2 diabetes mellitus with other diabetic neurological complication: Secondary | ICD-10-CM | POA: Diagnosis not present

## 2018-11-08 DIAGNOSIS — E1122 Type 2 diabetes mellitus with diabetic chronic kidney disease: Secondary | ICD-10-CM | POA: Diagnosis not present

## 2018-11-08 DIAGNOSIS — E1165 Type 2 diabetes mellitus with hyperglycemia: Secondary | ICD-10-CM | POA: Diagnosis not present

## 2018-11-08 DIAGNOSIS — E1142 Type 2 diabetes mellitus with diabetic polyneuropathy: Secondary | ICD-10-CM | POA: Diagnosis not present

## 2018-11-10 DIAGNOSIS — E1165 Type 2 diabetes mellitus with hyperglycemia: Secondary | ICD-10-CM | POA: Diagnosis not present

## 2018-11-10 DIAGNOSIS — I129 Hypertensive chronic kidney disease with stage 1 through stage 4 chronic kidney disease, or unspecified chronic kidney disease: Secondary | ICD-10-CM | POA: Diagnosis not present

## 2018-11-10 DIAGNOSIS — I2510 Atherosclerotic heart disease of native coronary artery without angina pectoris: Secondary | ICD-10-CM | POA: Diagnosis not present

## 2018-11-10 DIAGNOSIS — N181 Chronic kidney disease, stage 1: Secondary | ICD-10-CM | POA: Diagnosis not present

## 2018-11-10 DIAGNOSIS — E1142 Type 2 diabetes mellitus with diabetic polyneuropathy: Secondary | ICD-10-CM | POA: Diagnosis not present

## 2018-11-10 DIAGNOSIS — E1151 Type 2 diabetes mellitus with diabetic peripheral angiopathy without gangrene: Secondary | ICD-10-CM | POA: Diagnosis not present

## 2018-11-10 DIAGNOSIS — E1122 Type 2 diabetes mellitus with diabetic chronic kidney disease: Secondary | ICD-10-CM | POA: Diagnosis not present

## 2018-11-10 DIAGNOSIS — Z4781 Encounter for orthopedic aftercare following surgical amputation: Secondary | ICD-10-CM | POA: Diagnosis not present

## 2018-11-10 DIAGNOSIS — E1149 Type 2 diabetes mellitus with other diabetic neurological complication: Secondary | ICD-10-CM | POA: Diagnosis not present

## 2018-11-12 DIAGNOSIS — Z4781 Encounter for orthopedic aftercare following surgical amputation: Secondary | ICD-10-CM | POA: Diagnosis not present

## 2018-11-12 DIAGNOSIS — E1149 Type 2 diabetes mellitus with other diabetic neurological complication: Secondary | ICD-10-CM | POA: Diagnosis not present

## 2018-11-12 DIAGNOSIS — N181 Chronic kidney disease, stage 1: Secondary | ICD-10-CM | POA: Diagnosis not present

## 2018-11-12 DIAGNOSIS — I129 Hypertensive chronic kidney disease with stage 1 through stage 4 chronic kidney disease, or unspecified chronic kidney disease: Secondary | ICD-10-CM | POA: Diagnosis not present

## 2018-11-12 DIAGNOSIS — E1165 Type 2 diabetes mellitus with hyperglycemia: Secondary | ICD-10-CM | POA: Diagnosis not present

## 2018-11-12 DIAGNOSIS — E1142 Type 2 diabetes mellitus with diabetic polyneuropathy: Secondary | ICD-10-CM | POA: Diagnosis not present

## 2018-11-12 DIAGNOSIS — E1151 Type 2 diabetes mellitus with diabetic peripheral angiopathy without gangrene: Secondary | ICD-10-CM | POA: Diagnosis not present

## 2018-11-12 DIAGNOSIS — E1122 Type 2 diabetes mellitus with diabetic chronic kidney disease: Secondary | ICD-10-CM | POA: Diagnosis not present

## 2018-11-12 DIAGNOSIS — I2510 Atherosclerotic heart disease of native coronary artery without angina pectoris: Secondary | ICD-10-CM | POA: Diagnosis not present

## 2018-11-14 DIAGNOSIS — Z89512 Acquired absence of left leg below knee: Secondary | ICD-10-CM | POA: Diagnosis not present

## 2018-11-15 DIAGNOSIS — E1142 Type 2 diabetes mellitus with diabetic polyneuropathy: Secondary | ICD-10-CM | POA: Diagnosis not present

## 2018-11-15 DIAGNOSIS — E1151 Type 2 diabetes mellitus with diabetic peripheral angiopathy without gangrene: Secondary | ICD-10-CM | POA: Diagnosis not present

## 2018-11-15 DIAGNOSIS — E1165 Type 2 diabetes mellitus with hyperglycemia: Secondary | ICD-10-CM | POA: Diagnosis not present

## 2018-11-15 DIAGNOSIS — E1122 Type 2 diabetes mellitus with diabetic chronic kidney disease: Secondary | ICD-10-CM | POA: Diagnosis not present

## 2018-11-15 DIAGNOSIS — I2510 Atherosclerotic heart disease of native coronary artery without angina pectoris: Secondary | ICD-10-CM | POA: Diagnosis not present

## 2018-11-15 DIAGNOSIS — E1149 Type 2 diabetes mellitus with other diabetic neurological complication: Secondary | ICD-10-CM | POA: Diagnosis not present

## 2018-11-15 DIAGNOSIS — N181 Chronic kidney disease, stage 1: Secondary | ICD-10-CM | POA: Diagnosis not present

## 2018-11-15 DIAGNOSIS — I129 Hypertensive chronic kidney disease with stage 1 through stage 4 chronic kidney disease, or unspecified chronic kidney disease: Secondary | ICD-10-CM | POA: Diagnosis not present

## 2018-11-15 DIAGNOSIS — Z4781 Encounter for orthopedic aftercare following surgical amputation: Secondary | ICD-10-CM | POA: Diagnosis not present

## 2018-11-16 DIAGNOSIS — Z4781 Encounter for orthopedic aftercare following surgical amputation: Secondary | ICD-10-CM | POA: Diagnosis not present

## 2018-11-16 DIAGNOSIS — E1149 Type 2 diabetes mellitus with other diabetic neurological complication: Secondary | ICD-10-CM | POA: Diagnosis not present

## 2018-11-16 DIAGNOSIS — E1122 Type 2 diabetes mellitus with diabetic chronic kidney disease: Secondary | ICD-10-CM | POA: Diagnosis not present

## 2018-11-16 DIAGNOSIS — I129 Hypertensive chronic kidney disease with stage 1 through stage 4 chronic kidney disease, or unspecified chronic kidney disease: Secondary | ICD-10-CM | POA: Diagnosis not present

## 2018-11-16 DIAGNOSIS — E1142 Type 2 diabetes mellitus with diabetic polyneuropathy: Secondary | ICD-10-CM | POA: Diagnosis not present

## 2018-11-16 DIAGNOSIS — N181 Chronic kidney disease, stage 1: Secondary | ICD-10-CM | POA: Diagnosis not present

## 2018-11-16 DIAGNOSIS — I2510 Atherosclerotic heart disease of native coronary artery without angina pectoris: Secondary | ICD-10-CM | POA: Diagnosis not present

## 2018-11-16 DIAGNOSIS — E1151 Type 2 diabetes mellitus with diabetic peripheral angiopathy without gangrene: Secondary | ICD-10-CM | POA: Diagnosis not present

## 2018-11-16 DIAGNOSIS — E1165 Type 2 diabetes mellitus with hyperglycemia: Secondary | ICD-10-CM | POA: Diagnosis not present

## 2018-11-17 DIAGNOSIS — E1151 Type 2 diabetes mellitus with diabetic peripheral angiopathy without gangrene: Secondary | ICD-10-CM | POA: Diagnosis not present

## 2018-11-17 DIAGNOSIS — N181 Chronic kidney disease, stage 1: Secondary | ICD-10-CM | POA: Diagnosis not present

## 2018-11-17 DIAGNOSIS — E1122 Type 2 diabetes mellitus with diabetic chronic kidney disease: Secondary | ICD-10-CM | POA: Diagnosis not present

## 2018-11-17 DIAGNOSIS — I2510 Atherosclerotic heart disease of native coronary artery without angina pectoris: Secondary | ICD-10-CM | POA: Diagnosis not present

## 2018-11-17 DIAGNOSIS — E1142 Type 2 diabetes mellitus with diabetic polyneuropathy: Secondary | ICD-10-CM | POA: Diagnosis not present

## 2018-11-17 DIAGNOSIS — I129 Hypertensive chronic kidney disease with stage 1 through stage 4 chronic kidney disease, or unspecified chronic kidney disease: Secondary | ICD-10-CM | POA: Diagnosis not present

## 2018-11-17 DIAGNOSIS — E1149 Type 2 diabetes mellitus with other diabetic neurological complication: Secondary | ICD-10-CM | POA: Diagnosis not present

## 2018-11-17 DIAGNOSIS — E1165 Type 2 diabetes mellitus with hyperglycemia: Secondary | ICD-10-CM | POA: Diagnosis not present

## 2018-11-17 DIAGNOSIS — Z4781 Encounter for orthopedic aftercare following surgical amputation: Secondary | ICD-10-CM | POA: Diagnosis not present

## 2018-11-19 DIAGNOSIS — Z4781 Encounter for orthopedic aftercare following surgical amputation: Secondary | ICD-10-CM | POA: Diagnosis not present

## 2018-11-19 DIAGNOSIS — E1151 Type 2 diabetes mellitus with diabetic peripheral angiopathy without gangrene: Secondary | ICD-10-CM | POA: Diagnosis not present

## 2018-11-19 DIAGNOSIS — E1165 Type 2 diabetes mellitus with hyperglycemia: Secondary | ICD-10-CM | POA: Diagnosis not present

## 2018-11-19 DIAGNOSIS — N181 Chronic kidney disease, stage 1: Secondary | ICD-10-CM | POA: Diagnosis not present

## 2018-11-19 DIAGNOSIS — I129 Hypertensive chronic kidney disease with stage 1 through stage 4 chronic kidney disease, or unspecified chronic kidney disease: Secondary | ICD-10-CM | POA: Diagnosis not present

## 2018-11-19 DIAGNOSIS — E1142 Type 2 diabetes mellitus with diabetic polyneuropathy: Secondary | ICD-10-CM | POA: Diagnosis not present

## 2018-11-19 DIAGNOSIS — I2510 Atherosclerotic heart disease of native coronary artery without angina pectoris: Secondary | ICD-10-CM | POA: Diagnosis not present

## 2018-11-19 DIAGNOSIS — E1149 Type 2 diabetes mellitus with other diabetic neurological complication: Secondary | ICD-10-CM | POA: Diagnosis not present

## 2018-11-19 DIAGNOSIS — E1122 Type 2 diabetes mellitus with diabetic chronic kidney disease: Secondary | ICD-10-CM | POA: Diagnosis not present

## 2018-11-22 DIAGNOSIS — E1165 Type 2 diabetes mellitus with hyperglycemia: Secondary | ICD-10-CM | POA: Diagnosis not present

## 2018-11-22 DIAGNOSIS — E1142 Type 2 diabetes mellitus with diabetic polyneuropathy: Secondary | ICD-10-CM | POA: Diagnosis not present

## 2018-11-22 DIAGNOSIS — Z4781 Encounter for orthopedic aftercare following surgical amputation: Secondary | ICD-10-CM | POA: Diagnosis not present

## 2018-11-22 DIAGNOSIS — E1149 Type 2 diabetes mellitus with other diabetic neurological complication: Secondary | ICD-10-CM | POA: Diagnosis not present

## 2018-11-22 DIAGNOSIS — E1122 Type 2 diabetes mellitus with diabetic chronic kidney disease: Secondary | ICD-10-CM | POA: Diagnosis not present

## 2018-11-22 DIAGNOSIS — I129 Hypertensive chronic kidney disease with stage 1 through stage 4 chronic kidney disease, or unspecified chronic kidney disease: Secondary | ICD-10-CM | POA: Diagnosis not present

## 2018-11-22 DIAGNOSIS — N181 Chronic kidney disease, stage 1: Secondary | ICD-10-CM | POA: Diagnosis not present

## 2018-11-22 DIAGNOSIS — E1151 Type 2 diabetes mellitus with diabetic peripheral angiopathy without gangrene: Secondary | ICD-10-CM | POA: Diagnosis not present

## 2018-11-22 DIAGNOSIS — I2510 Atherosclerotic heart disease of native coronary artery without angina pectoris: Secondary | ICD-10-CM | POA: Diagnosis not present

## 2018-11-23 DIAGNOSIS — I129 Hypertensive chronic kidney disease with stage 1 through stage 4 chronic kidney disease, or unspecified chronic kidney disease: Secondary | ICD-10-CM | POA: Diagnosis not present

## 2018-11-23 DIAGNOSIS — E1122 Type 2 diabetes mellitus with diabetic chronic kidney disease: Secondary | ICD-10-CM | POA: Diagnosis not present

## 2018-11-23 DIAGNOSIS — E1151 Type 2 diabetes mellitus with diabetic peripheral angiopathy without gangrene: Secondary | ICD-10-CM | POA: Diagnosis not present

## 2018-11-23 DIAGNOSIS — E1165 Type 2 diabetes mellitus with hyperglycemia: Secondary | ICD-10-CM | POA: Diagnosis not present

## 2018-11-23 DIAGNOSIS — E1149 Type 2 diabetes mellitus with other diabetic neurological complication: Secondary | ICD-10-CM | POA: Diagnosis not present

## 2018-11-23 DIAGNOSIS — E1142 Type 2 diabetes mellitus with diabetic polyneuropathy: Secondary | ICD-10-CM | POA: Diagnosis not present

## 2018-11-23 DIAGNOSIS — Z4781 Encounter for orthopedic aftercare following surgical amputation: Secondary | ICD-10-CM | POA: Diagnosis not present

## 2018-11-23 DIAGNOSIS — I2510 Atherosclerotic heart disease of native coronary artery without angina pectoris: Secondary | ICD-10-CM | POA: Diagnosis not present

## 2018-11-23 DIAGNOSIS — N181 Chronic kidney disease, stage 1: Secondary | ICD-10-CM | POA: Diagnosis not present

## 2018-11-24 DIAGNOSIS — E1165 Type 2 diabetes mellitus with hyperglycemia: Secondary | ICD-10-CM | POA: Diagnosis not present

## 2018-11-24 DIAGNOSIS — I129 Hypertensive chronic kidney disease with stage 1 through stage 4 chronic kidney disease, or unspecified chronic kidney disease: Secondary | ICD-10-CM | POA: Diagnosis not present

## 2018-11-24 DIAGNOSIS — Z4781 Encounter for orthopedic aftercare following surgical amputation: Secondary | ICD-10-CM | POA: Diagnosis not present

## 2018-11-24 DIAGNOSIS — E1151 Type 2 diabetes mellitus with diabetic peripheral angiopathy without gangrene: Secondary | ICD-10-CM | POA: Diagnosis not present

## 2018-11-24 DIAGNOSIS — N181 Chronic kidney disease, stage 1: Secondary | ICD-10-CM | POA: Diagnosis not present

## 2018-11-24 DIAGNOSIS — I2510 Atherosclerotic heart disease of native coronary artery without angina pectoris: Secondary | ICD-10-CM | POA: Diagnosis not present

## 2018-11-24 DIAGNOSIS — E1122 Type 2 diabetes mellitus with diabetic chronic kidney disease: Secondary | ICD-10-CM | POA: Diagnosis not present

## 2018-11-24 DIAGNOSIS — E1142 Type 2 diabetes mellitus with diabetic polyneuropathy: Secondary | ICD-10-CM | POA: Diagnosis not present

## 2018-11-24 DIAGNOSIS — E1149 Type 2 diabetes mellitus with other diabetic neurological complication: Secondary | ICD-10-CM | POA: Diagnosis not present

## 2018-11-25 DIAGNOSIS — Z4781 Encounter for orthopedic aftercare following surgical amputation: Secondary | ICD-10-CM | POA: Diagnosis not present

## 2018-11-25 DIAGNOSIS — I2510 Atherosclerotic heart disease of native coronary artery without angina pectoris: Secondary | ICD-10-CM | POA: Diagnosis not present

## 2018-11-25 DIAGNOSIS — E1151 Type 2 diabetes mellitus with diabetic peripheral angiopathy without gangrene: Secondary | ICD-10-CM | POA: Diagnosis not present

## 2018-11-25 DIAGNOSIS — I129 Hypertensive chronic kidney disease with stage 1 through stage 4 chronic kidney disease, or unspecified chronic kidney disease: Secondary | ICD-10-CM | POA: Diagnosis not present

## 2018-11-25 DIAGNOSIS — E1142 Type 2 diabetes mellitus with diabetic polyneuropathy: Secondary | ICD-10-CM | POA: Diagnosis not present

## 2018-11-25 DIAGNOSIS — N181 Chronic kidney disease, stage 1: Secondary | ICD-10-CM | POA: Diagnosis not present

## 2018-11-25 DIAGNOSIS — E1165 Type 2 diabetes mellitus with hyperglycemia: Secondary | ICD-10-CM | POA: Diagnosis not present

## 2018-11-25 DIAGNOSIS — E1122 Type 2 diabetes mellitus with diabetic chronic kidney disease: Secondary | ICD-10-CM | POA: Diagnosis not present

## 2018-11-25 DIAGNOSIS — E1149 Type 2 diabetes mellitus with other diabetic neurological complication: Secondary | ICD-10-CM | POA: Diagnosis not present

## 2018-11-26 DIAGNOSIS — I2510 Atherosclerotic heart disease of native coronary artery without angina pectoris: Secondary | ICD-10-CM | POA: Diagnosis not present

## 2018-11-26 DIAGNOSIS — E1149 Type 2 diabetes mellitus with other diabetic neurological complication: Secondary | ICD-10-CM | POA: Diagnosis not present

## 2018-11-26 DIAGNOSIS — E1142 Type 2 diabetes mellitus with diabetic polyneuropathy: Secondary | ICD-10-CM | POA: Diagnosis not present

## 2018-11-26 DIAGNOSIS — Z4781 Encounter for orthopedic aftercare following surgical amputation: Secondary | ICD-10-CM | POA: Diagnosis not present

## 2018-11-26 DIAGNOSIS — E1151 Type 2 diabetes mellitus with diabetic peripheral angiopathy without gangrene: Secondary | ICD-10-CM | POA: Diagnosis not present

## 2018-11-26 DIAGNOSIS — I129 Hypertensive chronic kidney disease with stage 1 through stage 4 chronic kidney disease, or unspecified chronic kidney disease: Secondary | ICD-10-CM | POA: Diagnosis not present

## 2018-11-26 DIAGNOSIS — E1122 Type 2 diabetes mellitus with diabetic chronic kidney disease: Secondary | ICD-10-CM | POA: Diagnosis not present

## 2018-11-26 DIAGNOSIS — N181 Chronic kidney disease, stage 1: Secondary | ICD-10-CM | POA: Diagnosis not present

## 2018-11-26 DIAGNOSIS — E1165 Type 2 diabetes mellitus with hyperglycemia: Secondary | ICD-10-CM | POA: Diagnosis not present

## 2018-11-29 DIAGNOSIS — E1165 Type 2 diabetes mellitus with hyperglycemia: Secondary | ICD-10-CM | POA: Diagnosis not present

## 2018-11-29 DIAGNOSIS — N181 Chronic kidney disease, stage 1: Secondary | ICD-10-CM | POA: Diagnosis not present

## 2018-11-29 DIAGNOSIS — I2510 Atherosclerotic heart disease of native coronary artery without angina pectoris: Secondary | ICD-10-CM | POA: Diagnosis not present

## 2018-11-29 DIAGNOSIS — E1122 Type 2 diabetes mellitus with diabetic chronic kidney disease: Secondary | ICD-10-CM | POA: Diagnosis not present

## 2018-11-29 DIAGNOSIS — Z89512 Acquired absence of left leg below knee: Secondary | ICD-10-CM | POA: Diagnosis not present

## 2018-11-29 DIAGNOSIS — E1151 Type 2 diabetes mellitus with diabetic peripheral angiopathy without gangrene: Secondary | ICD-10-CM | POA: Diagnosis not present

## 2018-11-29 DIAGNOSIS — E1142 Type 2 diabetes mellitus with diabetic polyneuropathy: Secondary | ICD-10-CM | POA: Diagnosis not present

## 2018-11-29 DIAGNOSIS — Z4781 Encounter for orthopedic aftercare following surgical amputation: Secondary | ICD-10-CM | POA: Diagnosis not present

## 2018-11-29 DIAGNOSIS — I129 Hypertensive chronic kidney disease with stage 1 through stage 4 chronic kidney disease, or unspecified chronic kidney disease: Secondary | ICD-10-CM | POA: Diagnosis not present

## 2018-11-29 DIAGNOSIS — E1149 Type 2 diabetes mellitus with other diabetic neurological complication: Secondary | ICD-10-CM | POA: Diagnosis not present

## 2018-12-01 DIAGNOSIS — I129 Hypertensive chronic kidney disease with stage 1 through stage 4 chronic kidney disease, or unspecified chronic kidney disease: Secondary | ICD-10-CM | POA: Diagnosis not present

## 2018-12-01 DIAGNOSIS — N181 Chronic kidney disease, stage 1: Secondary | ICD-10-CM | POA: Diagnosis not present

## 2018-12-01 DIAGNOSIS — E1149 Type 2 diabetes mellitus with other diabetic neurological complication: Secondary | ICD-10-CM | POA: Diagnosis not present

## 2018-12-01 DIAGNOSIS — Z4781 Encounter for orthopedic aftercare following surgical amputation: Secondary | ICD-10-CM | POA: Diagnosis not present

## 2018-12-01 DIAGNOSIS — E1165 Type 2 diabetes mellitus with hyperglycemia: Secondary | ICD-10-CM | POA: Diagnosis not present

## 2018-12-01 DIAGNOSIS — E1151 Type 2 diabetes mellitus with diabetic peripheral angiopathy without gangrene: Secondary | ICD-10-CM | POA: Diagnosis not present

## 2018-12-01 DIAGNOSIS — E1142 Type 2 diabetes mellitus with diabetic polyneuropathy: Secondary | ICD-10-CM | POA: Diagnosis not present

## 2018-12-01 DIAGNOSIS — E1122 Type 2 diabetes mellitus with diabetic chronic kidney disease: Secondary | ICD-10-CM | POA: Diagnosis not present

## 2018-12-01 DIAGNOSIS — I2510 Atherosclerotic heart disease of native coronary artery without angina pectoris: Secondary | ICD-10-CM | POA: Diagnosis not present

## 2018-12-02 DIAGNOSIS — E1149 Type 2 diabetes mellitus with other diabetic neurological complication: Secondary | ICD-10-CM | POA: Diagnosis not present

## 2018-12-02 DIAGNOSIS — I129 Hypertensive chronic kidney disease with stage 1 through stage 4 chronic kidney disease, or unspecified chronic kidney disease: Secondary | ICD-10-CM | POA: Diagnosis not present

## 2018-12-02 DIAGNOSIS — E1122 Type 2 diabetes mellitus with diabetic chronic kidney disease: Secondary | ICD-10-CM | POA: Diagnosis not present

## 2018-12-02 DIAGNOSIS — I2510 Atherosclerotic heart disease of native coronary artery without angina pectoris: Secondary | ICD-10-CM | POA: Diagnosis not present

## 2018-12-02 DIAGNOSIS — E1142 Type 2 diabetes mellitus with diabetic polyneuropathy: Secondary | ICD-10-CM | POA: Diagnosis not present

## 2018-12-02 DIAGNOSIS — N181 Chronic kidney disease, stage 1: Secondary | ICD-10-CM | POA: Diagnosis not present

## 2018-12-02 DIAGNOSIS — E1151 Type 2 diabetes mellitus with diabetic peripheral angiopathy without gangrene: Secondary | ICD-10-CM | POA: Diagnosis not present

## 2018-12-02 DIAGNOSIS — Z4781 Encounter for orthopedic aftercare following surgical amputation: Secondary | ICD-10-CM | POA: Diagnosis not present

## 2018-12-02 DIAGNOSIS — E1165 Type 2 diabetes mellitus with hyperglycemia: Secondary | ICD-10-CM | POA: Diagnosis not present

## 2018-12-03 DIAGNOSIS — E1165 Type 2 diabetes mellitus with hyperglycemia: Secondary | ICD-10-CM | POA: Diagnosis not present

## 2018-12-03 DIAGNOSIS — I2510 Atherosclerotic heart disease of native coronary artery without angina pectoris: Secondary | ICD-10-CM | POA: Diagnosis not present

## 2018-12-03 DIAGNOSIS — E1122 Type 2 diabetes mellitus with diabetic chronic kidney disease: Secondary | ICD-10-CM | POA: Diagnosis not present

## 2018-12-03 DIAGNOSIS — E1151 Type 2 diabetes mellitus with diabetic peripheral angiopathy without gangrene: Secondary | ICD-10-CM | POA: Diagnosis not present

## 2018-12-03 DIAGNOSIS — Z4781 Encounter for orthopedic aftercare following surgical amputation: Secondary | ICD-10-CM | POA: Diagnosis not present

## 2018-12-03 DIAGNOSIS — N181 Chronic kidney disease, stage 1: Secondary | ICD-10-CM | POA: Diagnosis not present

## 2018-12-03 DIAGNOSIS — I129 Hypertensive chronic kidney disease with stage 1 through stage 4 chronic kidney disease, or unspecified chronic kidney disease: Secondary | ICD-10-CM | POA: Diagnosis not present

## 2018-12-03 DIAGNOSIS — E1142 Type 2 diabetes mellitus with diabetic polyneuropathy: Secondary | ICD-10-CM | POA: Diagnosis not present

## 2018-12-03 DIAGNOSIS — E1149 Type 2 diabetes mellitus with other diabetic neurological complication: Secondary | ICD-10-CM | POA: Diagnosis not present

## 2018-12-06 DIAGNOSIS — N181 Chronic kidney disease, stage 1: Secondary | ICD-10-CM | POA: Diagnosis not present

## 2018-12-06 DIAGNOSIS — E1142 Type 2 diabetes mellitus with diabetic polyneuropathy: Secondary | ICD-10-CM | POA: Diagnosis not present

## 2018-12-06 DIAGNOSIS — I2510 Atherosclerotic heart disease of native coronary artery without angina pectoris: Secondary | ICD-10-CM | POA: Diagnosis not present

## 2018-12-06 DIAGNOSIS — I129 Hypertensive chronic kidney disease with stage 1 through stage 4 chronic kidney disease, or unspecified chronic kidney disease: Secondary | ICD-10-CM | POA: Diagnosis not present

## 2018-12-06 DIAGNOSIS — E1122 Type 2 diabetes mellitus with diabetic chronic kidney disease: Secondary | ICD-10-CM | POA: Diagnosis not present

## 2018-12-06 DIAGNOSIS — E1165 Type 2 diabetes mellitus with hyperglycemia: Secondary | ICD-10-CM | POA: Diagnosis not present

## 2018-12-06 DIAGNOSIS — E1151 Type 2 diabetes mellitus with diabetic peripheral angiopathy without gangrene: Secondary | ICD-10-CM | POA: Diagnosis not present

## 2018-12-06 DIAGNOSIS — E1149 Type 2 diabetes mellitus with other diabetic neurological complication: Secondary | ICD-10-CM | POA: Diagnosis not present

## 2018-12-06 DIAGNOSIS — Z4781 Encounter for orthopedic aftercare following surgical amputation: Secondary | ICD-10-CM | POA: Diagnosis not present

## 2018-12-07 DIAGNOSIS — N181 Chronic kidney disease, stage 1: Secondary | ICD-10-CM | POA: Diagnosis not present

## 2018-12-07 DIAGNOSIS — E1149 Type 2 diabetes mellitus with other diabetic neurological complication: Secondary | ICD-10-CM | POA: Diagnosis not present

## 2018-12-07 DIAGNOSIS — E1151 Type 2 diabetes mellitus with diabetic peripheral angiopathy without gangrene: Secondary | ICD-10-CM | POA: Diagnosis not present

## 2018-12-07 DIAGNOSIS — I129 Hypertensive chronic kidney disease with stage 1 through stage 4 chronic kidney disease, or unspecified chronic kidney disease: Secondary | ICD-10-CM | POA: Diagnosis not present

## 2018-12-07 DIAGNOSIS — E1165 Type 2 diabetes mellitus with hyperglycemia: Secondary | ICD-10-CM | POA: Diagnosis not present

## 2018-12-07 DIAGNOSIS — E1142 Type 2 diabetes mellitus with diabetic polyneuropathy: Secondary | ICD-10-CM | POA: Diagnosis not present

## 2018-12-07 DIAGNOSIS — E1122 Type 2 diabetes mellitus with diabetic chronic kidney disease: Secondary | ICD-10-CM | POA: Diagnosis not present

## 2018-12-07 DIAGNOSIS — I2510 Atherosclerotic heart disease of native coronary artery without angina pectoris: Secondary | ICD-10-CM | POA: Diagnosis not present

## 2018-12-07 DIAGNOSIS — Z4781 Encounter for orthopedic aftercare following surgical amputation: Secondary | ICD-10-CM | POA: Diagnosis not present

## 2018-12-10 DIAGNOSIS — E1151 Type 2 diabetes mellitus with diabetic peripheral angiopathy without gangrene: Secondary | ICD-10-CM | POA: Diagnosis not present

## 2018-12-10 DIAGNOSIS — E1122 Type 2 diabetes mellitus with diabetic chronic kidney disease: Secondary | ICD-10-CM | POA: Diagnosis not present

## 2018-12-10 DIAGNOSIS — E1149 Type 2 diabetes mellitus with other diabetic neurological complication: Secondary | ICD-10-CM | POA: Diagnosis not present

## 2018-12-10 DIAGNOSIS — E1142 Type 2 diabetes mellitus with diabetic polyneuropathy: Secondary | ICD-10-CM | POA: Diagnosis not present

## 2018-12-10 DIAGNOSIS — N181 Chronic kidney disease, stage 1: Secondary | ICD-10-CM | POA: Diagnosis not present

## 2018-12-10 DIAGNOSIS — I129 Hypertensive chronic kidney disease with stage 1 through stage 4 chronic kidney disease, or unspecified chronic kidney disease: Secondary | ICD-10-CM | POA: Diagnosis not present

## 2018-12-10 DIAGNOSIS — E1165 Type 2 diabetes mellitus with hyperglycemia: Secondary | ICD-10-CM | POA: Diagnosis not present

## 2018-12-10 DIAGNOSIS — I2510 Atherosclerotic heart disease of native coronary artery without angina pectoris: Secondary | ICD-10-CM | POA: Diagnosis not present

## 2018-12-10 DIAGNOSIS — Z4781 Encounter for orthopedic aftercare following surgical amputation: Secondary | ICD-10-CM | POA: Diagnosis not present

## 2018-12-13 DIAGNOSIS — E1122 Type 2 diabetes mellitus with diabetic chronic kidney disease: Secondary | ICD-10-CM | POA: Diagnosis not present

## 2018-12-13 DIAGNOSIS — E1149 Type 2 diabetes mellitus with other diabetic neurological complication: Secondary | ICD-10-CM | POA: Diagnosis not present

## 2018-12-13 DIAGNOSIS — E1142 Type 2 diabetes mellitus with diabetic polyneuropathy: Secondary | ICD-10-CM | POA: Diagnosis not present

## 2018-12-13 DIAGNOSIS — N181 Chronic kidney disease, stage 1: Secondary | ICD-10-CM | POA: Diagnosis not present

## 2018-12-13 DIAGNOSIS — I129 Hypertensive chronic kidney disease with stage 1 through stage 4 chronic kidney disease, or unspecified chronic kidney disease: Secondary | ICD-10-CM | POA: Diagnosis not present

## 2018-12-13 DIAGNOSIS — I2510 Atherosclerotic heart disease of native coronary artery without angina pectoris: Secondary | ICD-10-CM | POA: Diagnosis not present

## 2018-12-13 DIAGNOSIS — E1165 Type 2 diabetes mellitus with hyperglycemia: Secondary | ICD-10-CM | POA: Diagnosis not present

## 2018-12-13 DIAGNOSIS — Z4781 Encounter for orthopedic aftercare following surgical amputation: Secondary | ICD-10-CM | POA: Diagnosis not present

## 2018-12-13 DIAGNOSIS — E1151 Type 2 diabetes mellitus with diabetic peripheral angiopathy without gangrene: Secondary | ICD-10-CM | POA: Diagnosis not present

## 2018-12-14 DIAGNOSIS — N529 Male erectile dysfunction, unspecified: Secondary | ICD-10-CM | POA: Diagnosis not present

## 2018-12-14 DIAGNOSIS — Z6828 Body mass index (BMI) 28.0-28.9, adult: Secondary | ICD-10-CM | POA: Diagnosis not present

## 2018-12-14 DIAGNOSIS — E1140 Type 2 diabetes mellitus with diabetic neuropathy, unspecified: Secondary | ICD-10-CM | POA: Diagnosis not present

## 2018-12-14 DIAGNOSIS — Z89512 Acquired absence of left leg below knee: Secondary | ICD-10-CM | POA: Diagnosis not present

## 2018-12-14 DIAGNOSIS — I10 Essential (primary) hypertension: Secondary | ICD-10-CM | POA: Diagnosis not present

## 2018-12-14 DIAGNOSIS — Z1331 Encounter for screening for depression: Secondary | ICD-10-CM | POA: Diagnosis not present

## 2018-12-14 DIAGNOSIS — Z139 Encounter for screening, unspecified: Secondary | ICD-10-CM | POA: Diagnosis not present

## 2018-12-14 DIAGNOSIS — M79605 Pain in left leg: Secondary | ICD-10-CM | POA: Diagnosis not present

## 2018-12-15 DIAGNOSIS — I2510 Atherosclerotic heart disease of native coronary artery without angina pectoris: Secondary | ICD-10-CM | POA: Diagnosis not present

## 2018-12-15 DIAGNOSIS — E1122 Type 2 diabetes mellitus with diabetic chronic kidney disease: Secondary | ICD-10-CM | POA: Diagnosis not present

## 2018-12-15 DIAGNOSIS — E1149 Type 2 diabetes mellitus with other diabetic neurological complication: Secondary | ICD-10-CM | POA: Diagnosis not present

## 2018-12-15 DIAGNOSIS — Z4781 Encounter for orthopedic aftercare following surgical amputation: Secondary | ICD-10-CM | POA: Diagnosis not present

## 2018-12-15 DIAGNOSIS — E1165 Type 2 diabetes mellitus with hyperglycemia: Secondary | ICD-10-CM | POA: Diagnosis not present

## 2018-12-15 DIAGNOSIS — Z89512 Acquired absence of left leg below knee: Secondary | ICD-10-CM | POA: Diagnosis not present

## 2018-12-15 DIAGNOSIS — I129 Hypertensive chronic kidney disease with stage 1 through stage 4 chronic kidney disease, or unspecified chronic kidney disease: Secondary | ICD-10-CM | POA: Diagnosis not present

## 2018-12-15 DIAGNOSIS — E1142 Type 2 diabetes mellitus with diabetic polyneuropathy: Secondary | ICD-10-CM | POA: Diagnosis not present

## 2018-12-15 DIAGNOSIS — E1151 Type 2 diabetes mellitus with diabetic peripheral angiopathy without gangrene: Secondary | ICD-10-CM | POA: Diagnosis not present

## 2018-12-15 DIAGNOSIS — N181 Chronic kidney disease, stage 1: Secondary | ICD-10-CM | POA: Diagnosis not present

## 2018-12-17 DIAGNOSIS — E1151 Type 2 diabetes mellitus with diabetic peripheral angiopathy without gangrene: Secondary | ICD-10-CM | POA: Diagnosis not present

## 2018-12-17 DIAGNOSIS — E1149 Type 2 diabetes mellitus with other diabetic neurological complication: Secondary | ICD-10-CM | POA: Diagnosis not present

## 2018-12-17 DIAGNOSIS — N181 Chronic kidney disease, stage 1: Secondary | ICD-10-CM | POA: Diagnosis not present

## 2018-12-17 DIAGNOSIS — I129 Hypertensive chronic kidney disease with stage 1 through stage 4 chronic kidney disease, or unspecified chronic kidney disease: Secondary | ICD-10-CM | POA: Diagnosis not present

## 2018-12-17 DIAGNOSIS — E1165 Type 2 diabetes mellitus with hyperglycemia: Secondary | ICD-10-CM | POA: Diagnosis not present

## 2018-12-17 DIAGNOSIS — I2510 Atherosclerotic heart disease of native coronary artery without angina pectoris: Secondary | ICD-10-CM | POA: Diagnosis not present

## 2018-12-17 DIAGNOSIS — E1122 Type 2 diabetes mellitus with diabetic chronic kidney disease: Secondary | ICD-10-CM | POA: Diagnosis not present

## 2018-12-17 DIAGNOSIS — E1142 Type 2 diabetes mellitus with diabetic polyneuropathy: Secondary | ICD-10-CM | POA: Diagnosis not present

## 2018-12-17 DIAGNOSIS — Z4781 Encounter for orthopedic aftercare following surgical amputation: Secondary | ICD-10-CM | POA: Diagnosis not present

## 2018-12-20 DIAGNOSIS — E1142 Type 2 diabetes mellitus with diabetic polyneuropathy: Secondary | ICD-10-CM | POA: Diagnosis not present

## 2018-12-20 DIAGNOSIS — N181 Chronic kidney disease, stage 1: Secondary | ICD-10-CM | POA: Diagnosis not present

## 2018-12-20 DIAGNOSIS — Z4781 Encounter for orthopedic aftercare following surgical amputation: Secondary | ICD-10-CM | POA: Diagnosis not present

## 2018-12-20 DIAGNOSIS — I129 Hypertensive chronic kidney disease with stage 1 through stage 4 chronic kidney disease, or unspecified chronic kidney disease: Secondary | ICD-10-CM | POA: Diagnosis not present

## 2018-12-20 DIAGNOSIS — E1149 Type 2 diabetes mellitus with other diabetic neurological complication: Secondary | ICD-10-CM | POA: Diagnosis not present

## 2018-12-20 DIAGNOSIS — E1122 Type 2 diabetes mellitus with diabetic chronic kidney disease: Secondary | ICD-10-CM | POA: Diagnosis not present

## 2018-12-20 DIAGNOSIS — I2510 Atherosclerotic heart disease of native coronary artery without angina pectoris: Secondary | ICD-10-CM | POA: Diagnosis not present

## 2018-12-20 DIAGNOSIS — E1165 Type 2 diabetes mellitus with hyperglycemia: Secondary | ICD-10-CM | POA: Diagnosis not present

## 2018-12-20 DIAGNOSIS — E1151 Type 2 diabetes mellitus with diabetic peripheral angiopathy without gangrene: Secondary | ICD-10-CM | POA: Diagnosis not present

## 2018-12-21 DIAGNOSIS — N181 Chronic kidney disease, stage 1: Secondary | ICD-10-CM | POA: Diagnosis not present

## 2018-12-21 DIAGNOSIS — I129 Hypertensive chronic kidney disease with stage 1 through stage 4 chronic kidney disease, or unspecified chronic kidney disease: Secondary | ICD-10-CM | POA: Diagnosis not present

## 2018-12-21 DIAGNOSIS — I2510 Atherosclerotic heart disease of native coronary artery without angina pectoris: Secondary | ICD-10-CM | POA: Diagnosis not present

## 2018-12-21 DIAGNOSIS — E1122 Type 2 diabetes mellitus with diabetic chronic kidney disease: Secondary | ICD-10-CM | POA: Diagnosis not present

## 2018-12-21 DIAGNOSIS — E1151 Type 2 diabetes mellitus with diabetic peripheral angiopathy without gangrene: Secondary | ICD-10-CM | POA: Diagnosis not present

## 2018-12-21 DIAGNOSIS — E1149 Type 2 diabetes mellitus with other diabetic neurological complication: Secondary | ICD-10-CM | POA: Diagnosis not present

## 2018-12-21 DIAGNOSIS — Z4781 Encounter for orthopedic aftercare following surgical amputation: Secondary | ICD-10-CM | POA: Diagnosis not present

## 2018-12-21 DIAGNOSIS — E1142 Type 2 diabetes mellitus with diabetic polyneuropathy: Secondary | ICD-10-CM | POA: Diagnosis not present

## 2018-12-21 DIAGNOSIS — E1165 Type 2 diabetes mellitus with hyperglycemia: Secondary | ICD-10-CM | POA: Diagnosis not present

## 2018-12-22 DIAGNOSIS — Z4781 Encounter for orthopedic aftercare following surgical amputation: Secondary | ICD-10-CM | POA: Diagnosis not present

## 2018-12-22 DIAGNOSIS — N181 Chronic kidney disease, stage 1: Secondary | ICD-10-CM | POA: Diagnosis not present

## 2018-12-22 DIAGNOSIS — E1142 Type 2 diabetes mellitus with diabetic polyneuropathy: Secondary | ICD-10-CM | POA: Diagnosis not present

## 2018-12-22 DIAGNOSIS — I129 Hypertensive chronic kidney disease with stage 1 through stage 4 chronic kidney disease, or unspecified chronic kidney disease: Secondary | ICD-10-CM | POA: Diagnosis not present

## 2018-12-22 DIAGNOSIS — E1151 Type 2 diabetes mellitus with diabetic peripheral angiopathy without gangrene: Secondary | ICD-10-CM | POA: Diagnosis not present

## 2018-12-22 DIAGNOSIS — E1122 Type 2 diabetes mellitus with diabetic chronic kidney disease: Secondary | ICD-10-CM | POA: Diagnosis not present

## 2018-12-22 DIAGNOSIS — I2510 Atherosclerotic heart disease of native coronary artery without angina pectoris: Secondary | ICD-10-CM | POA: Diagnosis not present

## 2018-12-22 DIAGNOSIS — E1165 Type 2 diabetes mellitus with hyperglycemia: Secondary | ICD-10-CM | POA: Diagnosis not present

## 2018-12-22 DIAGNOSIS — E1149 Type 2 diabetes mellitus with other diabetic neurological complication: Secondary | ICD-10-CM | POA: Diagnosis not present

## 2018-12-23 DIAGNOSIS — N181 Chronic kidney disease, stage 1: Secondary | ICD-10-CM | POA: Diagnosis not present

## 2018-12-23 DIAGNOSIS — E1122 Type 2 diabetes mellitus with diabetic chronic kidney disease: Secondary | ICD-10-CM | POA: Diagnosis not present

## 2018-12-23 DIAGNOSIS — E1149 Type 2 diabetes mellitus with other diabetic neurological complication: Secondary | ICD-10-CM | POA: Diagnosis not present

## 2018-12-23 DIAGNOSIS — Z4781 Encounter for orthopedic aftercare following surgical amputation: Secondary | ICD-10-CM | POA: Diagnosis not present

## 2018-12-23 DIAGNOSIS — E1151 Type 2 diabetes mellitus with diabetic peripheral angiopathy without gangrene: Secondary | ICD-10-CM | POA: Diagnosis not present

## 2018-12-23 DIAGNOSIS — I129 Hypertensive chronic kidney disease with stage 1 through stage 4 chronic kidney disease, or unspecified chronic kidney disease: Secondary | ICD-10-CM | POA: Diagnosis not present

## 2018-12-23 DIAGNOSIS — E1165 Type 2 diabetes mellitus with hyperglycemia: Secondary | ICD-10-CM | POA: Diagnosis not present

## 2018-12-23 DIAGNOSIS — I2510 Atherosclerotic heart disease of native coronary artery without angina pectoris: Secondary | ICD-10-CM | POA: Diagnosis not present

## 2018-12-23 DIAGNOSIS — E1142 Type 2 diabetes mellitus with diabetic polyneuropathy: Secondary | ICD-10-CM | POA: Diagnosis not present

## 2018-12-24 DIAGNOSIS — Z89512 Acquired absence of left leg below knee: Secondary | ICD-10-CM | POA: Diagnosis not present

## 2018-12-24 DIAGNOSIS — E1342 Other specified diabetes mellitus with diabetic polyneuropathy: Secondary | ICD-10-CM | POA: Diagnosis not present

## 2018-12-24 DIAGNOSIS — E1140 Type 2 diabetes mellitus with diabetic neuropathy, unspecified: Secondary | ICD-10-CM | POA: Diagnosis not present

## 2018-12-24 DIAGNOSIS — E11622 Type 2 diabetes mellitus with other skin ulcer: Secondary | ICD-10-CM | POA: Diagnosis not present

## 2018-12-24 DIAGNOSIS — L97223 Non-pressure chronic ulcer of left calf with necrosis of muscle: Secondary | ICD-10-CM | POA: Diagnosis not present

## 2018-12-24 DIAGNOSIS — T8130XA Disruption of wound, unspecified, initial encounter: Secondary | ICD-10-CM | POA: Diagnosis not present

## 2018-12-28 DIAGNOSIS — E1122 Type 2 diabetes mellitus with diabetic chronic kidney disease: Secondary | ICD-10-CM | POA: Diagnosis not present

## 2018-12-28 DIAGNOSIS — E1165 Type 2 diabetes mellitus with hyperglycemia: Secondary | ICD-10-CM | POA: Diagnosis not present

## 2018-12-28 DIAGNOSIS — E1151 Type 2 diabetes mellitus with diabetic peripheral angiopathy without gangrene: Secondary | ICD-10-CM | POA: Diagnosis not present

## 2018-12-28 DIAGNOSIS — E1149 Type 2 diabetes mellitus with other diabetic neurological complication: Secondary | ICD-10-CM | POA: Diagnosis not present

## 2018-12-28 DIAGNOSIS — I129 Hypertensive chronic kidney disease with stage 1 through stage 4 chronic kidney disease, or unspecified chronic kidney disease: Secondary | ICD-10-CM | POA: Diagnosis not present

## 2018-12-28 DIAGNOSIS — I2510 Atherosclerotic heart disease of native coronary artery without angina pectoris: Secondary | ICD-10-CM | POA: Diagnosis not present

## 2018-12-28 DIAGNOSIS — Z4781 Encounter for orthopedic aftercare following surgical amputation: Secondary | ICD-10-CM | POA: Diagnosis not present

## 2018-12-28 DIAGNOSIS — N181 Chronic kidney disease, stage 1: Secondary | ICD-10-CM | POA: Diagnosis not present

## 2018-12-28 DIAGNOSIS — E1142 Type 2 diabetes mellitus with diabetic polyneuropathy: Secondary | ICD-10-CM | POA: Diagnosis not present

## 2018-12-29 DIAGNOSIS — I129 Hypertensive chronic kidney disease with stage 1 through stage 4 chronic kidney disease, or unspecified chronic kidney disease: Secondary | ICD-10-CM | POA: Diagnosis not present

## 2018-12-29 DIAGNOSIS — E1149 Type 2 diabetes mellitus with other diabetic neurological complication: Secondary | ICD-10-CM | POA: Diagnosis not present

## 2018-12-29 DIAGNOSIS — I2510 Atherosclerotic heart disease of native coronary artery without angina pectoris: Secondary | ICD-10-CM | POA: Diagnosis not present

## 2018-12-29 DIAGNOSIS — Z4781 Encounter for orthopedic aftercare following surgical amputation: Secondary | ICD-10-CM | POA: Diagnosis not present

## 2018-12-29 DIAGNOSIS — E1165 Type 2 diabetes mellitus with hyperglycemia: Secondary | ICD-10-CM | POA: Diagnosis not present

## 2018-12-29 DIAGNOSIS — E1142 Type 2 diabetes mellitus with diabetic polyneuropathy: Secondary | ICD-10-CM | POA: Diagnosis not present

## 2018-12-29 DIAGNOSIS — E1151 Type 2 diabetes mellitus with diabetic peripheral angiopathy without gangrene: Secondary | ICD-10-CM | POA: Diagnosis not present

## 2018-12-29 DIAGNOSIS — E1122 Type 2 diabetes mellitus with diabetic chronic kidney disease: Secondary | ICD-10-CM | POA: Diagnosis not present

## 2018-12-29 DIAGNOSIS — N181 Chronic kidney disease, stage 1: Secondary | ICD-10-CM | POA: Diagnosis not present

## 2018-12-30 DIAGNOSIS — Z89512 Acquired absence of left leg below knee: Secondary | ICD-10-CM | POA: Diagnosis not present

## 2018-12-31 DIAGNOSIS — E1151 Type 2 diabetes mellitus with diabetic peripheral angiopathy without gangrene: Secondary | ICD-10-CM | POA: Diagnosis not present

## 2018-12-31 DIAGNOSIS — E1122 Type 2 diabetes mellitus with diabetic chronic kidney disease: Secondary | ICD-10-CM | POA: Diagnosis not present

## 2018-12-31 DIAGNOSIS — N181 Chronic kidney disease, stage 1: Secondary | ICD-10-CM | POA: Diagnosis not present

## 2018-12-31 DIAGNOSIS — I2510 Atherosclerotic heart disease of native coronary artery without angina pectoris: Secondary | ICD-10-CM | POA: Diagnosis not present

## 2018-12-31 DIAGNOSIS — E1149 Type 2 diabetes mellitus with other diabetic neurological complication: Secondary | ICD-10-CM | POA: Diagnosis not present

## 2018-12-31 DIAGNOSIS — E1165 Type 2 diabetes mellitus with hyperglycemia: Secondary | ICD-10-CM | POA: Diagnosis not present

## 2018-12-31 DIAGNOSIS — I129 Hypertensive chronic kidney disease with stage 1 through stage 4 chronic kidney disease, or unspecified chronic kidney disease: Secondary | ICD-10-CM | POA: Diagnosis not present

## 2018-12-31 DIAGNOSIS — E1142 Type 2 diabetes mellitus with diabetic polyneuropathy: Secondary | ICD-10-CM | POA: Diagnosis not present

## 2018-12-31 DIAGNOSIS — Z4781 Encounter for orthopedic aftercare following surgical amputation: Secondary | ICD-10-CM | POA: Diagnosis not present

## 2019-01-03 DIAGNOSIS — E1142 Type 2 diabetes mellitus with diabetic polyneuropathy: Secondary | ICD-10-CM | POA: Diagnosis not present

## 2019-01-03 DIAGNOSIS — Z4781 Encounter for orthopedic aftercare following surgical amputation: Secondary | ICD-10-CM | POA: Diagnosis not present

## 2019-01-03 DIAGNOSIS — E1122 Type 2 diabetes mellitus with diabetic chronic kidney disease: Secondary | ICD-10-CM | POA: Diagnosis not present

## 2019-01-03 DIAGNOSIS — N181 Chronic kidney disease, stage 1: Secondary | ICD-10-CM | POA: Diagnosis not present

## 2019-01-03 DIAGNOSIS — E1151 Type 2 diabetes mellitus with diabetic peripheral angiopathy without gangrene: Secondary | ICD-10-CM | POA: Diagnosis not present

## 2019-01-03 DIAGNOSIS — I129 Hypertensive chronic kidney disease with stage 1 through stage 4 chronic kidney disease, or unspecified chronic kidney disease: Secondary | ICD-10-CM | POA: Diagnosis not present

## 2019-01-03 DIAGNOSIS — E1149 Type 2 diabetes mellitus with other diabetic neurological complication: Secondary | ICD-10-CM | POA: Diagnosis not present

## 2019-01-03 DIAGNOSIS — E1165 Type 2 diabetes mellitus with hyperglycemia: Secondary | ICD-10-CM | POA: Diagnosis not present

## 2019-01-03 DIAGNOSIS — I2510 Atherosclerotic heart disease of native coronary artery without angina pectoris: Secondary | ICD-10-CM | POA: Diagnosis not present

## 2019-01-05 DIAGNOSIS — E1165 Type 2 diabetes mellitus with hyperglycemia: Secondary | ICD-10-CM | POA: Diagnosis not present

## 2019-01-05 DIAGNOSIS — E1151 Type 2 diabetes mellitus with diabetic peripheral angiopathy without gangrene: Secondary | ICD-10-CM | POA: Diagnosis not present

## 2019-01-05 DIAGNOSIS — I129 Hypertensive chronic kidney disease with stage 1 through stage 4 chronic kidney disease, or unspecified chronic kidney disease: Secondary | ICD-10-CM | POA: Diagnosis not present

## 2019-01-05 DIAGNOSIS — N181 Chronic kidney disease, stage 1: Secondary | ICD-10-CM | POA: Diagnosis not present

## 2019-01-05 DIAGNOSIS — I2510 Atherosclerotic heart disease of native coronary artery without angina pectoris: Secondary | ICD-10-CM | POA: Diagnosis not present

## 2019-01-05 DIAGNOSIS — E1122 Type 2 diabetes mellitus with diabetic chronic kidney disease: Secondary | ICD-10-CM | POA: Diagnosis not present

## 2019-01-05 DIAGNOSIS — E1142 Type 2 diabetes mellitus with diabetic polyneuropathy: Secondary | ICD-10-CM | POA: Diagnosis not present

## 2019-01-05 DIAGNOSIS — Z4781 Encounter for orthopedic aftercare following surgical amputation: Secondary | ICD-10-CM | POA: Diagnosis not present

## 2019-01-05 DIAGNOSIS — E1149 Type 2 diabetes mellitus with other diabetic neurological complication: Secondary | ICD-10-CM | POA: Diagnosis not present

## 2019-01-07 ENCOUNTER — Inpatient Hospital Stay (HOSPITAL_COMMUNITY): Payer: Medicare HMO | Admitting: Certified Registered"

## 2019-01-07 ENCOUNTER — Other Ambulatory Visit: Payer: Self-pay

## 2019-01-07 ENCOUNTER — Emergency Department (HOSPITAL_COMMUNITY): Payer: Medicare HMO

## 2019-01-07 ENCOUNTER — Encounter (HOSPITAL_COMMUNITY)
Admission: EM | Disposition: A | Discharge status: Home Health | Payer: Self-pay | Source: Home / Self Care | Attending: Vascular Surgery

## 2019-01-07 ENCOUNTER — Inpatient Hospital Stay (HOSPITAL_COMMUNITY)
Admission: EM | Admit: 2019-01-07 | Discharge: 2019-01-11 | DRG: 475 | Disposition: A | Discharge status: Home Health | Payer: Medicare HMO | Attending: Vascular Surgery | Admitting: Vascular Surgery

## 2019-01-07 DIAGNOSIS — I10 Essential (primary) hypertension: Secondary | ICD-10-CM | POA: Diagnosis present

## 2019-01-07 DIAGNOSIS — E1165 Type 2 diabetes mellitus with hyperglycemia: Secondary | ICD-10-CM | POA: Diagnosis not present

## 2019-01-07 DIAGNOSIS — I96 Gangrene, not elsewhere classified: Secondary | ICD-10-CM | POA: Diagnosis not present

## 2019-01-07 DIAGNOSIS — E1142 Type 2 diabetes mellitus with diabetic polyneuropathy: Secondary | ICD-10-CM | POA: Diagnosis not present

## 2019-01-07 DIAGNOSIS — E7800 Pure hypercholesterolemia, unspecified: Secondary | ICD-10-CM | POA: Diagnosis present

## 2019-01-07 DIAGNOSIS — I2510 Atherosclerotic heart disease of native coronary artery without angina pectoris: Secondary | ICD-10-CM | POA: Diagnosis not present

## 2019-01-07 DIAGNOSIS — T8789 Other complications of amputation stump: Principal | ICD-10-CM | POA: Diagnosis present

## 2019-01-07 DIAGNOSIS — Z89512 Acquired absence of left leg below knee: Secondary | ICD-10-CM | POA: Diagnosis not present

## 2019-01-07 DIAGNOSIS — E1152 Type 2 diabetes mellitus with diabetic peripheral angiopathy with gangrene: Secondary | ICD-10-CM | POA: Diagnosis present

## 2019-01-07 DIAGNOSIS — E1122 Type 2 diabetes mellitus with diabetic chronic kidney disease: Secondary | ICD-10-CM | POA: Diagnosis not present

## 2019-01-07 DIAGNOSIS — N181 Chronic kidney disease, stage 1: Secondary | ICD-10-CM | POA: Diagnosis not present

## 2019-01-07 DIAGNOSIS — R52 Pain, unspecified: Secondary | ICD-10-CM | POA: Diagnosis not present

## 2019-01-07 DIAGNOSIS — D62 Acute posthemorrhagic anemia: Secondary | ICD-10-CM | POA: Diagnosis not present

## 2019-01-07 DIAGNOSIS — R000 Tachycardia, unspecified: Secondary | ICD-10-CM | POA: Diagnosis not present

## 2019-01-07 DIAGNOSIS — Z4781 Encounter for orthopedic aftercare following surgical amputation: Secondary | ICD-10-CM | POA: Diagnosis not present

## 2019-01-07 DIAGNOSIS — E1149 Type 2 diabetes mellitus with other diabetic neurological complication: Secondary | ICD-10-CM | POA: Diagnosis not present

## 2019-01-07 DIAGNOSIS — I739 Peripheral vascular disease, unspecified: Secondary | ICD-10-CM | POA: Diagnosis present

## 2019-01-07 DIAGNOSIS — I70262 Atherosclerosis of native arteries of extremities with gangrene, left leg: Secondary | ICD-10-CM | POA: Diagnosis not present

## 2019-01-07 DIAGNOSIS — T148XXA Other injury of unspecified body region, initial encounter: Secondary | ICD-10-CM | POA: Diagnosis present

## 2019-01-07 DIAGNOSIS — Z7984 Long term (current) use of oral hypoglycemic drugs: Secondary | ICD-10-CM

## 2019-01-07 DIAGNOSIS — T8754 Necrosis of amputation stump, left lower extremity: Secondary | ICD-10-CM | POA: Diagnosis not present

## 2019-01-07 DIAGNOSIS — S8991XA Unspecified injury of right lower leg, initial encounter: Secondary | ICD-10-CM | POA: Diagnosis present

## 2019-01-07 DIAGNOSIS — E1151 Type 2 diabetes mellitus with diabetic peripheral angiopathy without gangrene: Secondary | ICD-10-CM | POA: Diagnosis not present

## 2019-01-07 DIAGNOSIS — Z79899 Other long term (current) drug therapy: Secondary | ICD-10-CM

## 2019-01-07 DIAGNOSIS — Y838 Other surgical procedures as the cause of abnormal reaction of the patient, or of later complication, without mention of misadventure at the time of the procedure: Secondary | ICD-10-CM | POA: Diagnosis present

## 2019-01-07 DIAGNOSIS — R58 Hemorrhage, not elsewhere classified: Secondary | ICD-10-CM | POA: Diagnosis not present

## 2019-01-07 DIAGNOSIS — I700 Atherosclerosis of aorta: Secondary | ICD-10-CM | POA: Diagnosis not present

## 2019-01-07 DIAGNOSIS — I129 Hypertensive chronic kidney disease with stage 1 through stage 4 chronic kidney disease, or unspecified chronic kidney disease: Secondary | ICD-10-CM | POA: Diagnosis not present

## 2019-01-07 DIAGNOSIS — T8781 Dehiscence of amputation stump: Secondary | ICD-10-CM | POA: Diagnosis not present

## 2019-01-07 DIAGNOSIS — L97929 Non-pressure chronic ulcer of unspecified part of left lower leg with unspecified severity: Secondary | ICD-10-CM | POA: Diagnosis not present

## 2019-01-07 DIAGNOSIS — E109 Type 1 diabetes mellitus without complications: Secondary | ICD-10-CM | POA: Diagnosis not present

## 2019-01-07 DIAGNOSIS — S88912A Complete traumatic amputation of left lower leg, level unspecified, initial encounter: Secondary | ICD-10-CM | POA: Diagnosis not present

## 2019-01-07 HISTORY — PX: AMPUTATION: SHX166

## 2019-01-07 LAB — TYPE AND SCREEN
ABO/RH(D): O POS
Antibody Screen: NEGATIVE

## 2019-01-07 LAB — COMPREHENSIVE METABOLIC PANEL
ALT: 15 U/L (ref 0–44)
ANION GAP: 10 (ref 5–15)
AST: 17 U/L (ref 15–41)
Albumin: 3.5 g/dL (ref 3.5–5.0)
Alkaline Phosphatase: 61 U/L (ref 38–126)
BUN: 9 mg/dL (ref 8–23)
CALCIUM: 10.1 mg/dL (ref 8.9–10.3)
CHLORIDE: 104 mmol/L (ref 98–111)
CO2: 23 mmol/L (ref 22–32)
Creatinine, Ser: 0.87 mg/dL (ref 0.61–1.24)
GFR calc non Af Amer: >60 mL/min (ref >60)
Glucose, Bld: 153 mg/dL — ABNORMAL HIGH (ref 70–99)
Potassium: 4.3 mmol/L (ref 3.5–5.1)
SODIUM: 137 mmol/L (ref 135–145)
Total Bilirubin: 0.6 mg/dL (ref 0.3–1.2)
Total Protein: 7.2 g/dL (ref 6.5–8.1)

## 2019-01-07 LAB — CBC WITH DIFFERENTIAL/PLATELET
Abs Immature Granulocytes: 0.02 10*3/uL (ref 0.00–0.07)
BASOS PCT: 1 %
Basophils Absolute: 0 10*3/uL (ref 0.0–0.1)
Eosinophils Absolute: 0.2 10*3/uL (ref 0.0–0.5)
Eosinophils Relative: 3 %
HCT: 42.2 % (ref 39.0–52.0)
Hemoglobin: 14.2 g/dL (ref 13.0–17.0)
Immature Granulocytes: 0 %
Lymphocytes Relative: 33 %
Lymphs Abs: 2.5 10*3/uL (ref 0.7–4.0)
MCH: 31.3 pg (ref 26.0–34.0)
MCHC: 33.6 g/dL (ref 30.0–36.0)
MCV: 93.2 fL (ref 80.0–100.0)
MONO ABS: 0.6 10*3/uL (ref 0.1–1.0)
Monocytes Relative: 8 %
Neutro Abs: 4.2 10*3/uL (ref 1.7–7.7)
Neutrophils Relative %: 55 %
PLATELETS: 249 10*3/uL (ref 150–400)
RBC: 4.53 MIL/uL (ref 4.22–5.81)
RDW: 16.5 % — ABNORMAL HIGH (ref 11.5–15.5)
WBC: 7.6 10*3/uL (ref 4.0–10.5)
nRBC: 0 % (ref 0.0–0.2)

## 2019-01-07 LAB — PROTIME-INR
INR: 0.96
Prothrombin Time: 12.7 seconds (ref 11.4–15.2)

## 2019-01-07 LAB — ABO/RH: ABO/RH(D): O POS

## 2019-01-07 LAB — CBG MONITORING, ED: GLUCOSE-CAPILLARY: 149 mg/dL — AB (ref 70–99)

## 2019-01-07 LAB — BRAIN NATRIURETIC PEPTIDE: B Natriuretic Peptide: 44.3 pg/mL (ref 0.0–100.0)

## 2019-01-07 SURGERY — AMPUTATION, ABOVE KNEE
Anesthesia: General | Laterality: Left

## 2019-01-07 MED ORDER — LIDOCAINE 2% (20 MG/ML) 5 ML SYRINGE
INTRAMUSCULAR | Status: AC
  Filled 2019-01-07: qty 5

## 2019-01-07 MED ORDER — SODIUM CHLORIDE 0.9 % IV BOLUS
1000.0000 mL | Freq: Once | INTRAVENOUS | Status: AC
  Administered 2019-01-07: 1000 mL via INTRAVENOUS

## 2019-01-07 MED ORDER — MIDAZOLAM HCL 2 MG/2ML IJ SOLN
INTRAMUSCULAR | Status: AC
  Filled 2019-01-07: qty 2

## 2019-01-07 MED ORDER — CEFAZOLIN SODIUM-DEXTROSE 2-4 GM/100ML-% IV SOLN
INTRAVENOUS | Status: AC
  Filled 2019-01-07: qty 100

## 2019-01-07 MED ORDER — FENTANYL CITRATE (PF) 100 MCG/2ML IJ SOLN
25.0000 ug | Freq: Once | INTRAMUSCULAR | Status: AC
  Administered 2019-01-07: 25 ug via INTRAVENOUS
  Filled 2019-01-07: qty 2

## 2019-01-07 MED ORDER — PROPOFOL 10 MG/ML IV BOLUS
INTRAVENOUS | Status: AC
  Filled 2019-01-07: qty 20

## 2019-01-07 MED ORDER — SODIUM CHLORIDE 0.9 % IV SOLN: INTRAVENOUS | Status: DC

## 2019-01-07 MED ORDER — ONDANSETRON HCL 4 MG/2ML IJ SOLN
4.0000 mg | Freq: Once | INTRAMUSCULAR | Status: AC
  Administered 2019-01-07: 4 mg via INTRAVENOUS
  Filled 2019-01-07: qty 2

## 2019-01-07 MED ORDER — FENTANYL CITRATE (PF) 250 MCG/5ML IJ SOLN
INTRAMUSCULAR | Status: AC
  Filled 2019-01-07: qty 5

## 2019-01-07 MED ORDER — SUCCINYLCHOLINE CHLORIDE 200 MG/10ML IV SOSY
PREFILLED_SYRINGE | INTRAVENOUS | Status: AC
  Filled 2019-01-07: qty 10

## 2019-01-07 SURGICAL SUPPLY — 61 items
BANDAGE ACE 4X5 VEL STRL LF (GAUZE/BANDAGES/DRESSINGS) IMPLANT
BANDAGE ACE 6X5 VEL STRL LF (GAUZE/BANDAGES/DRESSINGS) IMPLANT
BANDAGE ELASTIC 4 VELCRO ST LF (GAUZE/BANDAGES/DRESSINGS) ×3 IMPLANT
BANDAGE ESMARK 6X9 LF (GAUZE/BANDAGES/DRESSINGS) ×1 IMPLANT
BLADE SAGITTAL (BLADE)
BLADE SAGITTAL 25.0X1.19X90 (BLADE) IMPLANT
BLADE SAGITTAL 25.0X1.19X90MM (BLADE)
BLADE SAW GIGLI 510 (BLADE) ×2 IMPLANT
BLADE SAW GIGLI 510MM (BLADE) ×1
BLADE SAW THK.89X75X18XSGTL (BLADE) IMPLANT
BNDG COHESIVE 6X5 TAN STRL LF (GAUZE/BANDAGES/DRESSINGS) IMPLANT
BNDG ESMARK 6X9 LF (GAUZE/BANDAGES/DRESSINGS) ×3
BNDG GAUZE ELAST 4 BULKY (GAUZE/BANDAGES/DRESSINGS) ×3 IMPLANT
CANISTER SUCT 3000ML PPV (MISCELLANEOUS) ×3 IMPLANT
CLIP VESOCCLUDE MED 6/CT (CLIP) ×3 IMPLANT
COVER SURGICAL LIGHT HANDLE (MISCELLANEOUS) ×3 IMPLANT
COVER WAND RF STERILE (DRAPES) IMPLANT
CUFF TOURNIQUET SINGLE 24IN (TOURNIQUET CUFF) ×3 IMPLANT
CUFF TOURNIQUET SINGLE 34IN LL (TOURNIQUET CUFF) IMPLANT
DRAIN CHANNEL 19F RND (DRAIN) IMPLANT
DRAPE HALF SHEET 40X57 (DRAPES) ×3 IMPLANT
DRAPE ORTHO SPLIT 77X108 STRL (DRAPES) ×4
DRAPE SURG ORHT 6 SPLT 77X108 (DRAPES) ×2 IMPLANT
DRSG ADAPTIC 3X8 NADH LF (GAUZE/BANDAGES/DRESSINGS) ×3 IMPLANT
ELECT CAUTERY BLADE 6.4 (BLADE) ×3 IMPLANT
ELECT REM PT RETURN 9FT ADLT (ELECTROSURGICAL) ×3
ELECTRODE REM PT RTRN 9FT ADLT (ELECTROSURGICAL) ×1 IMPLANT
EVACUATOR SILICONE 100CC (DRAIN) IMPLANT
GAUZE SPONGE 4X4 12PLY STRL (GAUZE/BANDAGES/DRESSINGS) IMPLANT
GAUZE SPONGE 4X4 12PLY STRL LF (GAUZE/BANDAGES/DRESSINGS) ×3 IMPLANT
GLOVE BIO SURGEON STRL SZ7.5 (GLOVE) ×3 IMPLANT
GLOVE BIOGEL PI IND STRL 7.0 (GLOVE) ×1 IMPLANT
GLOVE BIOGEL PI INDICATOR 7.0 (GLOVE) ×2
GLOVE ECLIPSE 7.0 STRL STRAW (GLOVE) ×3 IMPLANT
GLOVE INDICATOR 7.5 STRL GRN (GLOVE) ×3 IMPLANT
GLOVE SURG SYN 7.5  E (GLOVE) ×4
GLOVE SURG SYN 7.5 E (GLOVE) ×2 IMPLANT
GOWN STRL REIN XL XLG (GOWN DISPOSABLE) ×6 IMPLANT
GOWN STRL REUS W/ TWL LRG LVL3 (GOWN DISPOSABLE) ×2 IMPLANT
GOWN STRL REUS W/ TWL XL LVL3 (GOWN DISPOSABLE) ×1 IMPLANT
GOWN STRL REUS W/TWL LRG LVL3 (GOWN DISPOSABLE) ×4
GOWN STRL REUS W/TWL XL LVL3 (GOWN DISPOSABLE) ×2
KIT BASIN OR (CUSTOM PROCEDURE TRAY) ×3 IMPLANT
KIT TURNOVER KIT B (KITS) ×3 IMPLANT
NS IRRIG 1000ML POUR BTL (IV SOLUTION) ×3 IMPLANT
PACK GENERAL/GYN (CUSTOM PROCEDURE TRAY) ×3 IMPLANT
PAD ARMBOARD 7.5X6 YLW CONV (MISCELLANEOUS) ×3 IMPLANT
SCRUB POVIDONE IODINE 4 OZ (MISCELLANEOUS) ×3 IMPLANT
SOLUTION BETADINE 4OZ (MISCELLANEOUS) ×3 IMPLANT
STAPLER VISISTAT 35W (STAPLE) ×3 IMPLANT
STOCKINETTE IMPERVIOUS LG (DRAPES) ×3 IMPLANT
SUT ETHILON 3 0 PS 1 (SUTURE) IMPLANT
SUT SILK 0 TIES 10X30 (SUTURE) ×3 IMPLANT
SUT SILK 2 0 (SUTURE) ×2
SUT SILK 2-0 18XBRD TIE 12 (SUTURE) ×1 IMPLANT
SUT SILK 3 0 (SUTURE)
SUT SILK 3-0 18XBRD TIE 12 (SUTURE) IMPLANT
SUT VIC AB 2-0 CT1 18 (SUTURE) ×9 IMPLANT
TOWEL GREEN STERILE (TOWEL DISPOSABLE) ×6 IMPLANT
UNDERPAD 30X30 (UNDERPADS AND DIAPERS) ×3 IMPLANT
WATER STERILE IRR 1000ML POUR (IV SOLUTION) IMPLANT

## 2019-01-07 NOTE — Anesthesia Preprocedure Evaluation (Addendum)
Anesthesia Evaluation  Patient identified by MRN, date of birth, ID band Patient awake    Reviewed: Allergy & Precautions, H&P , NPO status , Patient's Chart, lab work & pertinent test results  Airway Mallampati: I  TM Distance: >3 FB Neck ROM: Full    Dental no notable dental hx. (+) Edentulous Lower, Edentulous Upper, Dental Advisory Given   Pulmonary neg pulmonary ROS,    Pulmonary exam normal breath sounds clear to auscultation       Cardiovascular hypertension, Pt. on medications  Rhythm:Regular Rate:Normal     Neuro/Psych negative neurological ROS  negative psych ROS   GI/Hepatic negative GI ROS, Neg liver ROS,   Endo/Other  diabetes, Type 1, Insulin Dependent, Oral Hypoglycemic Agents  Renal/GU negative Renal ROS  negative genitourinary   Musculoskeletal   Abdominal   Peds  Hematology negative hematology ROS (+)   Anesthesia Other Findings   Reproductive/Obstetrics negative OB ROS                            Anesthesia Physical Anesthesia Plan  ASA: III and emergent  Anesthesia Plan: General   Post-op Pain Management:    Induction: Rapid sequence, Cricoid pressure planned and Intravenous  PONV Risk Score and Plan:   Airway Management Planned: Oral ETT  Additional Equipment:   Intra-op Plan:   Post-operative Plan: Extubation in OR  Informed Consent: I have reviewed the patients History and Physical, chart, labs and discussed the procedure including the risks, benefits and alternatives for the proposed anesthesia with the patient or authorized representative who has indicated his/her understanding and acceptance.     Dental advisory given  Plan Discussed with: Anesthesiologist, Surgeon and CRNA  Anesthesia Plan Comments:        Anesthesia Quick Evaluation

## 2019-01-07 NOTE — ED Triage Notes (Signed)
Patient arrived from home after his brother noticed that his stump was bleeding profusely. First tourniquet was applied by the fire department @ 21:05 and EMS tourniquet was applied at 21:13. Patient is alert and oriented x4.

## 2019-01-07 NOTE — H&P (Addendum)
HP    History of Present Illness: This is a 68 y.o. male history of type 2 diabetes hypercholesterolemia.  Underwent left below-knee amputation in August at [facility] has had wound issues since.  Has been following with wound care center there.  States that has been healing well.  Today was found bleeding in his home EMS was called.  States he did not fall.  States he is not had any other issues with the BKA like this.  Currently awake and oriented.  Complaining of pain at the site of the tourniquet of his left thigh.  Tourniquet was placed at 9 PM by EMS taken down in our institution and reapplied upon arrival.  Past Medical History:  Diagnosis Date  . Diabetes mellitus without complication ([place])   . Elevated cholesterol     Past Surgical History:  Procedure Laterality Date  . CHOLECYSTECTOMY    . EYE SURGERY    . INCISION AND DRAINAGE ABSCESS Left 02/03/2018   Procedure: INCISION AND DRAINAGE LEFT THUMB INFECTION;  Surgeon: Charlotte Crumb, MD;  Location: [facility];  Service: Orthopedics;  Laterality: Left;  . TONSILLECTOMY      No Known Allergies  Prior to Admission medications   Medication Sig Start Date End Date Taking? Authorizing Provider  amoxicillin-clavulanate (AUGMENTIN) 875-125 MG tablet Take 1 tablet by mouth 2 (two) times daily. 02/18/18   Michel Bickers, MD  DULoxetine (CYMBALTA) 60 MG capsule Take 60 mg by mouth daily.    [provider]  glipiZIDE (GLUCOTROL XL) 10 MG 24 hr tablet Take 10 mg by mouth daily with breakfast.    [provider]  HYDROcodone-acetaminophen (NORCO) 10-325 MG tablet Take 1 tablet by mouth every 6 (six) hours as needed.    [provider]  levofloxacin (LEVAQUIN) 500 MG tablet Take 1 tablet (500 mg total) by mouth daily. 04/01/18   Michel Bickers, MD  metFORMIN (GLUCOPHAGE) 500 MG tablet Take 500 mg by mouth 2 (two) times daily with a meal.    [provider]  metroNIDAZOLE (FLAGYL)  500 MG tablet Take 1 tablet (500 mg total) by mouth 3 (three) times daily. 04/01/18   Michel Bickers, MD  naproxen sodium (ANAPROX) 550 MG tablet Take 550 mg by mouth 2 (two) times daily with a meal.    [provider]  pioglitazone (ACTOS) 15 MG tablet Take 15 mg by mouth daily.    [provider]  pravastatin (PRAVACHOL) 40 MG tablet Take 40 mg by mouth daily.    [provider]  sitaGLIPtin (JANUVIA) 100 MG tablet Take 100 mg by mouth daily.    [provider]    Social History   Socioeconomic History  . Marital status: Married    Spouse name: Not on file  . Number of children: Not on file  . Years of education: Not on file  . Highest education level: Not on file  Occupational History  . Not on file  Social Needs  . Financial resource strain: Not on file  . Food insecurity:    Worry: Not on file    Inability: Not on file  . Transportation needs:    Medical: Not on file    Non-medical: Not on file  Tobacco Use  . Smoking status: Never Smoker  . Smokeless tobacco: Never Used  Substance and Sexual Activity  . Alcohol use: Yes    Alcohol/week: 42.0 standard drinks    Types: 42 Cans of beer per week  Frequency: Never    Comment: "~ 6 Natural Lights a day"  . Drug use: No  . Sexual activity: Not Currently  Lifestyle  . Physical activity:    Days per week: Not on file    Minutes per session: Not on file  . Stress: Not on file  Relationships  . Social connections:    Talks on phone: Not on file    Gets together: Not on file    Attends religious service: Not on file    Active member of club or organization: Not on file    Attends meetings of clubs or organizations: Not on file    Relationship status: Not on file  . Intimate partner violence:    Fear of current or ex partner: Not on file    Emotionally abused: Not on file    Physically abused: Not on file    Forced sexual activity: Not on file  Other Topics Concern  . Not on file    Social History Narrative  . Not on file    No family history on file.  ROS: only complains of pain in left thigh at tourniquet site   Physical Examination  Vitals:   01/07/19 2215 01/07/19 2220  BP: (!) 158/108 130/88  Pulse: 99 100  Resp: 19 19  Temp:    SpO2: 98% 97%   Body mass index is 21.79 kg/m.  General:  nad HENT: WNL, normocephalic Pulmonary: normal non-labored breathing, without Rales, rhonchi,  wheezing Cardiac: palpable radial pulses Abdomen: spft. ntnd Extremities: Right below-knee amputation with stump shrinker in place Left below-knee amputation site as a 5 x 5 cm necrotic ulcer with very soft tissue.  Posterior aspect of this there is persistent use of venous appearing blood despite presence of tourniquet around the left thigh Posterior lateral aspect of the left be low any amputation site has superficial ulceration. Neurologic: A&O X 3   CBC    Component Value Date/Time   WBC 7.6 01/07/2019 2150   RBC 4.53 01/07/2019 2150   HGB 14.2 01/07/2019 2150   HCT 42.2 01/07/2019 2150   PLT 249 01/07/2019 2150   MCV 93.2 01/07/2019 2150   MCH 31.3 01/07/2019 2150   MCHC 33.6 01/07/2019 2150   RDW 16.5 (H) 01/07/2019 2150   LYMPHSABS 2.5 01/07/2019 2150   MONOABS 0.6 01/07/2019 2150   EOSABS 0.2 01/07/2019 2150   BASOSABS 0.0 01/07/2019 2150    BMET    Component Value Date/Time   NA 137 01/07/2019 2150   K 4.3 01/07/2019 2150   CL 104 01/07/2019 2150   CO2 23 01/07/2019 2150   GLUCOSE 153 (H) 01/07/2019 2150   BUN 9 01/07/2019 2150   CREATININE 0.87 01/07/2019 2150   CREATININE 0.92 02/18/2018 1031   CALCIUM 10.1 01/07/2019 2150   GFRNONAA >60 01/07/2019 2150   GFRAA >60 01/07/2019 2150    COAGS: Lab Results  Component Value Date   INR 0.96 01/07/2019       ASSESSMENT/PLAN: This is a 68 y.o. male here with bleeding from left below-knee amputation stump site.  There is a large necrotic ulcer the bone appears very near this.  This is  on the edge of the BKA where he is bleeding profusely from despite the tourniquet and compressive wrap was placed.  All of this blood is venous appearing although by report was arterial in previously.  There is also ulceration on the lateral aspect and posterior aspect of this below-knee amputation stump.  Given  all this I think will be best to revise this to an above-knee amputation as my best guess is that he has blown out his arterial or venous or both stumps secondary to infection from this large necrotic ulcer.  He is clearly upset with the idea of conversion to above-knee amputation but is understanding and willing to proceed.  He is in the presence of his family they demonstrate good understanding.  Consent signed at bedside.  Will proceed urgently to the operating room for above-knee amputation.  Raeana Blinn C. Donzetta Matters, MD [facility] Office: [phone] Pager: [phone]   Addendum:    With above knee tourniquet inflated to control bleeding.   Servando Snare, MD

## 2019-01-07 NOTE — ED Notes (Signed)
Left stump unwrapped; veinous blood noted dripping profusely from end of stump through an open wound. Md at bedside; stump re-wrapped with 4 tubs of 4x4's and 2 packages of Coban.

## 2019-01-07 NOTE — ED Provider Notes (Signed)
[facility] EMERGENCY DEPARTMENT Provider Note   CSN: [id number] Arrival date & time: 01/07/19  2148     History   Chief Complaint Chief Complaint  Patient presents with  . Bleeding/Bruising    Left leg/stump    HPI Shawn [name] is a 68 y.o. male.  Patient brought in by [facility].  Patient known history of hypertension peripheral vascular disease and diabetes.  No known history of coronary artery disease or heart attacks or congestive heart failure.  Patient status post left BKA done at [facility] August 2019.  Was being followed by wound care clinic for the last several months for nonhealing ulcers at the stump of the BKA.  Last seen January 10.  Patient did not have a fall and thinks he may have bumped the stump still had his wound care dressing in place and is started bleeding very profusely.  EMS said there was red blood all over the place that was squirting out.  First responders put an orange tourniquet in place.  That was done at about 2105.  And then EMS arrived and a second tourniquet was placed just above at this with black in color that was done at around 2110.  This helped control the bleeding but patient was still bleeding through the dressing they applied additional dressings.  Patient's blood pressure was fine.  He was tachycardic and will diaphoretic in route.  They were unable to establish an IV.  Patient has complaint of pain where the tourniquets are but otherwise no complaints.  Does not feel lightheaded does not feel he is in a pass out.  No chest pain no shortness of breath.     Past Medical History:  Diagnosis Date  . Diabetes mellitus without complication ([place])   . Elevated cholesterol     Patient Active Problem List   Diagnosis Date Noted  . Knee injury, right, initial encounter 01/07/2019  . Osteomyelitis of hand, left, acute ([place]) 02/18/2018    Past Surgical History:  Procedure Laterality Date  . CHOLECYSTECTOMY    .  EYE SURGERY    . INCISION AND DRAINAGE ABSCESS Left 02/03/2018   Procedure: INCISION AND DRAINAGE LEFT THUMB INFECTION;  Surgeon: Charlotte Crumb, MD;  Location: [facility];  Service: Orthopedics;  Laterality: Left;  . TONSILLECTOMY          Home Medications    Prior to Admission medications   Medication Sig Start Date End Date Taking? Authorizing Provider  amoxicillin-clavulanate (AUGMENTIN) 875-125 MG tablet Take 1 tablet by mouth 2 (two) times daily. 02/18/18   Michel Bickers, MD  DULoxetine (CYMBALTA) 60 MG capsule Take 60 mg by mouth daily.    [provider]  glipiZIDE (GLUCOTROL XL) 10 MG 24 hr tablet Take 10 mg by mouth daily with breakfast.    [provider]  HYDROcodone-acetaminophen (NORCO) 10-325 MG tablet Take 1 tablet by mouth every 6 (six) hours as needed.    [provider]  levofloxacin (LEVAQUIN) 500 MG tablet Take 1 tablet (500 mg total) by mouth daily. 04/01/18   Michel Bickers, MD  metFORMIN (GLUCOPHAGE) 500 MG tablet Take 500 mg by mouth 2 (two) times daily with a meal.    [provider]  metroNIDAZOLE (FLAGYL) 500 MG tablet Take 1 tablet (500 mg total) by mouth 3 (three) times daily. 04/01/18   Michel Bickers, MD  naproxen sodium (ANAPROX) 550 MG tablet Take 550 mg by mouth 2 (two) times daily with  a meal.    [provider]  pioglitazone (ACTOS) 15 MG tablet Take 15 mg by mouth daily.    [provider]  pravastatin (PRAVACHOL) 40 MG tablet Take 40 mg by mouth daily.    [provider]  sitaGLIPtin (JANUVIA) 100 MG tablet Take 100 mg by mouth daily.    [provider]    Family History No family history on file.  Social History Social History   Tobacco Use  . Smoking status: Never Smoker  . Smokeless tobacco: Never Used  Substance Use Topics  . Alcohol use: Yes    Alcohol/week: 42.0 standard drinks    Types: 42 Cans of beer per week    Frequency: Never    Comment: "~ 6  Natural Lights a day"  . Drug use: No     Allergies   Patient has no known allergies.   Review of Systems Review of Systems  Constitutional: Negative for chills and fever.  HENT: Negative for rhinorrhea and sore throat.   Eyes: Negative for visual disturbance.  Respiratory: Negative for cough and shortness of breath.   Cardiovascular: Negative for chest pain and leg swelling.  Gastrointestinal: Negative for abdominal pain, diarrhea, nausea and vomiting.  Genitourinary: Negative for dysuria.  Musculoskeletal: Negative for back pain and neck pain.  Skin: Positive for wound. Negative for rash.  Neurological: Negative for dizziness, light-headedness and headaches.  Hematological: Does not bruise/bleed easily.  Psychiatric/Behavioral: Negative for confusion.     Physical Exam Updated Vital Signs BP (!) 129/100   Pulse 98   Temp 98.3 F (36.8 C) (Oral)   Resp 15   Ht 1.676 m (5\' 6" )   Wt 61.2 kg   SpO2 98%   BMI 21.79 kg/m   Physical Exam Vitals signs and nursing note reviewed.  Constitutional:      Appearance: He is well-developed.  HENT:     Head: Normocephalic and atraumatic.     Mouth/Throat:     Mouth: Mucous membranes are moist.  Eyes:     Conjunctiva/sclera: Conjunctivae normal.  Neck:     Musculoskeletal: Neck supple.  Cardiovascular:     Rate and Rhythm: Normal rate and regular rhythm.     Heart sounds: No murmur.  Pulmonary:     Effort: Pulmonary effort is normal. No respiratory distress.     Breath sounds: Normal breath sounds.  Abdominal:     Palpations: Abdomen is soft.     Tenderness: There is no abdominal tenderness.  Musculoskeletal:     Comments: Patient with old right low the knee amputation with prosthesis.  Patient with a more recent left below the knee amputation with dressing saturated in blood.  2 tourniquets in the thigh area just above the knee.  Dressing removed revealing about 5 cm area of granulation or ulcer.  That was oozing dark  blood.  With a tourniquet in place obviously the tissue looks somewhat ischemic.  On the left side.  Skin:    General: Skin is warm and dry.  Neurological:     General: No focal deficit present.     Mental Status: He is alert and oriented to person, place, and time.      ED Treatments / Results  Labs (all labs ordered are listed, but only abnormal results are displayed) Labs Reviewed  CBC WITH DIFFERENTIAL/PLATELET - Abnormal; Notable for the following components:      Result Value   RDW 16.5 (*)    All other components  within normal limits  COMPREHENSIVE METABOLIC PANEL - Abnormal; Notable for the following components:   Glucose, Bld 153 (*)    All other components within normal limits  CBG MONITORING, ED - Abnormal; Notable for the following components:   Glucose-Capillary 149 (*)    All other components within normal limits  PROTIME-INR  BRAIN NATRIURETIC PEPTIDE  TYPE AND SCREEN  ABO/RH    EKG None  Radiology Dg Chest Port 1 View  Result Date: 01/07/2019 CLINICAL DATA:  Bleeding from below left knee amputation stump tonight. History of diabetes. Increased perspiration. EXAM: PORTABLE CHEST 1 VIEW COMPARISON:  10/26/2015 FINDINGS: Stable cardiomegaly with aortic atherosclerosis. Lungs are clear. [place] projects over the left upper quadrant of the abdomen. No acute osseous appearing abnormality. IMPRESSION: No active disease. Stable cardiomegaly with aortic atherosclerosis. Electronically Signed   By: Ashley Royalty M.D.   On: 01/07/2019 22:18   Dg Tibia/fibula Left Port  Result Date: 01/07/2019 CLINICAL DATA:  Bleeding from amputation stump. EXAM: PORTABLE LEFT TIBIA AND FIBULA - 2 VIEW COMPARISON:  None. FINDINGS: The patient is status post below-knee amputation. No focal soft tissue ulceration or soft tissue emphysema is identified of the stump. Nonspecific generalized mild soft tissue swelling may reflect inflammation or cellulitis. No definite evidence of acute  osteomyelitis. The surgical margins of the tibia and fibula appear intact without apparent bone destruction. Femoral through tibial arteriosclerosis is identified. Moderate joint space narrowing is identified of the knee. IMPRESSION: Status post below-knee amputation. Mild soft tissue prominence of the stomach may reflect cellulitis. No acute osseous abnormality. Electronically Signed   By: Ashley Royalty M.D.   On: 01/07/2019 22:21    Procedures Procedures (including critical care time)  CRITICAL CARE Performed by: Fredia Sorrow Total critical care time: 30 minutes Critical care time was exclusive of separately billable procedures and treating other patients. Critical care was necessary to treat or prevent imminent or life-threatening deterioration. Critical care was time spent personally by me on the following activities: development of treatment plan with patient and/or surrogate as well as nursing, discussions with consultants, evaluation of patient's response to treatment, examination of patient, obtaining history from patient or surrogate, ordering and performing treatments and interventions, ordering and review of laboratory studies, ordering and review of radiographic studies, pulse oximetry and re-evaluation of patient's condition.   Medications Ordered in ED Medications  0.9 %  sodium chloride infusion (has no administration in time range)  sodium chloride 0.9 % bolus 1,000 mL (1,000 mLs Intravenous New Bag/Given 01/07/19 2224)  ondansetron (ZOFRAN) injection 4 mg (4 mg Intravenous Given 01/07/19 2307)  fentaNYL (SUBLIMAZE) injection 25 mcg (25 mcg Intravenous Given 01/07/19 2308)     Initial Impression / Assessment and Plan / ED Course  I have reviewed the triage vital signs and the nursing notes.  Pertinent labs & imaging results that were available during my care of the patient were reviewed by me and considered in my medical decision making (see chart for details).     On  arrival patient was assessed noted to be diaphoretic and tachycardic.  2 IVs established.  Portable x-ray of the chest was done without any acute findings.  Also x-rays of his left BKA tib-fib area without any bony abnormalities.  2 tourniquets were in place 1 initially placed by firefighters and then second 1 placed by EMS patient came in by [facility].  Apparently had extensive bleeding at home.  There was no fall or injury it just started to  bleed spontaneously after getting bumped or knocked on.  Patient normally followed at [facility] had the [place] done in August.  Clearly was having some wound issues and was being followed by wound care at [facility] last visit was January 10.  Here after we had the IVs established and had fluids going get the tourniquets in place took the dressing down part of the dressing at the base would have been his normal wound care dressing.  The gauze was saturated with more red blood.  We will removed all of the dressing there is about a 5cm area of granulation or ulcer at the base of the stump that was just oozing dark blood.  Running fairly briskly but not's squirting or shooting out.  Again the tourniquets were still in place.   Discussed with vascular surgery.  Patient was significant bleeding from left BKA stump.  With a about a 5 cm area of granulation tissue.  Patient has 2 tourniquets still in place and still oozing blood.  Tourniquets were in place around 2100.  Seen by Dr. Gwenlyn Saran from vascular surgery determined he needed to take him to the OR and felt that they were probably at the be a revision of the BKA and may have to turn to an AKA.  Patient was tachycardic upon arrival with 1 L of fluid became non-tachycardic.  Was also diaphoretic that improved with a liter of fluid.  Patient initial hemoglobin was normal.  Patient's blood pressure was always elevated.  Final Clinical Impressions(s) / ED Diagnoses   Final diagnoses:  Amputation stump  injury    ED Discharge Orders    None       Fredia Sorrow, MD 01/07/19 2348

## 2019-01-08 ENCOUNTER — Encounter (HOSPITAL_COMMUNITY): Payer: Self-pay | Admitting: *Deleted

## 2019-01-08 DIAGNOSIS — T8781 Dehiscence of amputation stump: Secondary | ICD-10-CM

## 2019-01-08 DIAGNOSIS — I739 Peripheral vascular disease, unspecified: Secondary | ICD-10-CM | POA: Diagnosis present

## 2019-01-08 LAB — GLUCOSE, CAPILLARY
GLUCOSE-CAPILLARY: 179 mg/dL — AB (ref 70–99)
GLUCOSE-CAPILLARY: 144 mg/dL — AB (ref 70–99)
Glucose-Capillary: 151 mg/dL — ABNORMAL HIGH (ref 70–99)
Glucose-Capillary: 187 mg/dL — ABNORMAL HIGH (ref 70–99)
Glucose-Capillary: 245 mg/dL — ABNORMAL HIGH (ref 70–99)

## 2019-01-08 MED ORDER — BISACODYL 5 MG PO TBEC: 5.0000 mg | DELAYED_RELEASE_TABLET | Freq: Every day | ORAL | Status: DC | PRN

## 2019-01-08 MED ORDER — PHENOL 1.4 % MT LIQD: 1.0000 | OROMUCOSAL | Status: DC | PRN

## 2019-01-08 MED ORDER — ONDANSETRON HCL 4 MG/2ML IJ SOLN
INTRAMUSCULAR | Status: DC | PRN
  Administered 2019-01-08: 4 mg via INTRAVENOUS

## 2019-01-08 MED ORDER — FENTANYL CITRATE (PF) 250 MCG/5ML IJ SOLN
INTRAMUSCULAR | Status: DC | PRN
  Administered 2019-01-07: 100 ug via INTRAVENOUS

## 2019-01-08 MED ORDER — HYDROCODONE-ACETAMINOPHEN 10-325 MG PO TABS
1.0000 | ORAL_TABLET | ORAL | Status: DC | PRN
  Administered 2019-01-08 – 2019-01-11 (×8): 2 via ORAL
  Filled 2019-01-08 (×9): qty 2

## 2019-01-08 MED ORDER — METFORMIN HCL 500 MG PO TABS
500.0000 mg | ORAL_TABLET | Freq: Two times a day (BID) | ORAL | Status: DC
  Administered 2019-01-08 – 2019-01-11 (×5): 500 mg via ORAL
  Filled 2019-01-08 (×5): qty 1

## 2019-01-08 MED ORDER — ACETAMINOPHEN 325 MG RE SUPP
325.0000 mg | RECTAL | Status: DC | PRN
  Filled 2019-01-08: qty 2

## 2019-01-08 MED ORDER — MIDAZOLAM HCL 2 MG/2ML IJ SOLN
INTRAMUSCULAR | Status: DC | PRN
  Administered 2019-01-07: 1 mg via INTRAVENOUS

## 2019-01-08 MED ORDER — CEFAZOLIN SODIUM-DEXTROSE 2-3 GM-%(50ML) IV SOLR
INTRAVENOUS | Status: DC | PRN
  Administered 2019-01-07: 2 g via INTRAVENOUS

## 2019-01-08 MED ORDER — NAPROXEN SODIUM 275 MG PO TABS
550.0000 mg | ORAL_TABLET | Freq: Two times a day (BID) | ORAL | Status: DC
  Administered 2019-01-08: 550 mg via ORAL
  Administered 2019-01-08: 275 mg via ORAL
  Administered 2019-01-09 – 2019-01-11 (×5): 550 mg via ORAL
  Filled 2019-01-08 (×8): qty 2

## 2019-01-08 MED ORDER — LIDOCAINE HCL (CARDIAC) PF 100 MG/5ML IV SOSY
PREFILLED_SYRINGE | INTRAVENOUS | Status: DC | PRN
  Administered 2019-01-07: 60 mg via INTRATRACHEAL

## 2019-01-08 MED ORDER — INSULIN ASPART 100 UNIT/ML ~~LOC~~ SOLN
4.0000 [IU] | Freq: Once | SUBCUTANEOUS | Status: AC
  Administered 2019-01-08: 4 [IU] via SUBCUTANEOUS

## 2019-01-08 MED ORDER — PIOGLITAZONE HCL 15 MG PO TABS
15.0000 mg | ORAL_TABLET | Freq: Every day | ORAL | Status: DC
  Administered 2019-01-08 – 2019-01-09 (×2): 15 mg via ORAL
  Filled 2019-01-08 (×3): qty 1

## 2019-01-08 MED ORDER — DOCUSATE SODIUM 100 MG PO CAPS
100.0000 mg | ORAL_CAPSULE | Freq: Every day | ORAL | Status: DC
  Administered 2019-01-09 – 2019-01-11 (×3): 100 mg via ORAL
  Filled 2019-01-08 (×3): qty 1

## 2019-01-08 MED ORDER — LABETALOL HCL 5 MG/ML IV SOLN
10.0000 mg | INTRAVENOUS | Status: DC | PRN
  Filled 2019-01-08: qty 4

## 2019-01-08 MED ORDER — DULOXETINE HCL 60 MG PO CPEP
60.0000 mg | ORAL_CAPSULE | Freq: Every day | ORAL | Status: DC
  Administered 2019-01-08 – 2019-01-11 (×4): 60 mg via ORAL
  Filled 2019-01-08 (×4): qty 1

## 2019-01-08 MED ORDER — INSULIN ASPART 100 UNIT/ML ~~LOC~~ SOLN
SUBCUTANEOUS | Status: AC
  Filled 2019-01-08: qty 1

## 2019-01-08 MED ORDER — HYDRALAZINE HCL 20 MG/ML IJ SOLN: 5.0000 mg | INTRAMUSCULAR | Status: DC | PRN

## 2019-01-08 MED ORDER — ACETAMINOPHEN 325 MG PO TABS: 325.0000 mg | ORAL_TABLET | ORAL | Status: DC | PRN

## 2019-01-08 MED ORDER — GLIPIZIDE ER 10 MG PO TB24
10.0000 mg | ORAL_TABLET | Freq: Every day | ORAL | Status: DC
  Administered 2019-01-08 – 2019-01-09 (×2): 10 mg via ORAL
  Filled 2019-01-08 (×3): qty 1

## 2019-01-08 MED ORDER — HEPARIN SODIUM (PORCINE) 5000 UNIT/ML IJ SOLN
5000.0000 [IU] | Freq: Three times a day (TID) | INTRAMUSCULAR | Status: DC
  Administered 2019-01-08 – 2019-01-11 (×11): 5000 [IU] via SUBCUTANEOUS
  Filled 2019-01-08 (×11): qty 1

## 2019-01-08 MED ORDER — HYDROMORPHONE HCL 1 MG/ML IJ SOLN: 0.5000 mg | INTRAMUSCULAR | Status: DC | PRN

## 2019-01-08 MED ORDER — METRONIDAZOLE 500 MG PO TABS
500.0000 mg | ORAL_TABLET | Freq: Three times a day (TID) | ORAL | Status: DC
  Administered 2019-01-08 – 2019-01-11 (×10): 500 mg via ORAL
  Filled 2019-01-08 (×10): qty 1

## 2019-01-08 MED ORDER — SUCCINYLCHOLINE 20MG/ML (10ML) SYRINGE FOR MEDFUSION PUMP - OPTIME
INTRAMUSCULAR | Status: DC | PRN
  Administered 2019-01-07: 100 mg via INTRAVENOUS

## 2019-01-08 MED ORDER — SODIUM CHLORIDE 0.9 % IV SOLN
INTRAVENOUS | Status: DC
  Administered 2019-01-08: 02:00:00 via INTRAVENOUS

## 2019-01-08 MED ORDER — ONDANSETRON HCL 4 MG/2ML IJ SOLN: 4.0000 mg | Freq: Four times a day (QID) | INTRAMUSCULAR | Status: DC | PRN

## 2019-01-08 MED ORDER — SENNOSIDES-DOCUSATE SODIUM 8.6-50 MG PO TABS: 1.0000 | ORAL_TABLET | Freq: Every evening | ORAL | Status: DC | PRN

## 2019-01-08 MED ORDER — POTASSIUM CHLORIDE CRYS ER 20 MEQ PO TBCR: 20.0000 meq | EXTENDED_RELEASE_TABLET | Freq: Every day | ORAL | Status: DC | PRN

## 2019-01-08 MED ORDER — ALUM & MAG HYDROXIDE-SIMETH 200-200-20 MG/5ML PO SUSP: 15.0000 mL | ORAL | Status: DC | PRN

## 2019-01-08 MED ORDER — METOPROLOL TARTRATE 5 MG/5ML IV SOLN: 2.0000 mg | INTRAVENOUS | Status: DC | PRN

## 2019-01-08 MED ORDER — PRAVASTATIN SODIUM 40 MG PO TABS
40.0000 mg | ORAL_TABLET | Freq: Every day | ORAL | Status: DC
  Administered 2019-01-08 – 2019-01-11 (×4): 40 mg via ORAL
  Filled 2019-01-08 (×4): qty 1

## 2019-01-08 MED ORDER — CEFAZOLIN SODIUM-DEXTROSE 2-4 GM/100ML-% IV SOLN
2.0000 g | Freq: Three times a day (TID) | INTRAVENOUS | Status: AC
  Administered 2019-01-08 (×2): 2 g via INTRAVENOUS
  Filled 2019-01-08 (×2): qty 100

## 2019-01-08 MED ORDER — LINAGLIPTIN 5 MG PO TABS
5.0000 mg | ORAL_TABLET | Freq: Every day | ORAL | Status: DC
  Administered 2019-01-08 – 2019-01-09 (×2): 5 mg via ORAL
  Filled 2019-01-08 (×2): qty 1

## 2019-01-08 MED ORDER — LACTATED RINGERS IV SOLN
INTRAVENOUS | Status: DC | PRN
  Administered 2019-01-07: via INTRAVENOUS

## 2019-01-08 MED ORDER — 0.9 % SODIUM CHLORIDE (POUR BTL) OPTIME
TOPICAL | Status: DC | PRN
  Administered 2019-01-07: 1000 mL

## 2019-01-08 MED ORDER — MAGNESIUM SULFATE 2 GM/50ML IV SOLN
2.0000 g | Freq: Every day | INTRAVENOUS | Status: DC | PRN
  Filled 2019-01-08: qty 50

## 2019-01-08 MED ORDER — PANTOPRAZOLE SODIUM 40 MG PO TBEC
40.0000 mg | DELAYED_RELEASE_TABLET | Freq: Every day | ORAL | Status: DC
Start: 2019-01-08 — End: 2019-01-11
  Administered 2019-01-08 – 2019-01-11 (×4): 40 mg via ORAL
  Filled 2019-01-08 (×4): qty 1

## 2019-01-08 MED ORDER — GUAIFENESIN-DM 100-10 MG/5ML PO SYRP
15.0000 mL | ORAL_SOLUTION | ORAL | Status: DC | PRN
  Administered 2019-01-09: 15 mL via ORAL
  Filled 2019-01-08 (×2): qty 15

## 2019-01-08 MED ORDER — HYDROMORPHONE HCL 1 MG/ML IJ SOLN: 0.2500 mg | INTRAMUSCULAR | Status: DC | PRN

## 2019-01-08 MED ORDER — PROPOFOL 10 MG/ML IV BOLUS
INTRAVENOUS | Status: DC | PRN
  Administered 2019-01-07: 100 mg via INTRAVENOUS

## 2019-01-08 NOTE — Progress Notes (Signed)
  Progress Note    01/08/2019 9:25 AM 1 Day Post-Op  Subjective: Complains of mild pain left above-knee amputation site  Vitals:   01/08/19 0140 01/08/19 0411  BP: 99/73 110/73  Pulse: 95 87  Resp: 18 16  Temp: 97.8 F (36.6 C) 97.6 F (36.4 C)  SpO2: 96% 100%    Physical Exam: Awake alert and oriented Left above-knee amputation site with dressing clean dry intact  CBC    Component Value Date/Time   WBC 7.6 01/07/2019 2150   RBC 4.53 01/07/2019 2150   HGB 14.2 01/07/2019 2150   HCT 42.2 01/07/2019 2150   PLT 249 01/07/2019 2150   MCV 93.2 01/07/2019 2150   MCH 31.3 01/07/2019 2150   MCHC 33.6 01/07/2019 2150   RDW 16.5 (H) 01/07/2019 2150   LYMPHSABS 2.5 01/07/2019 2150   MONOABS 0.6 01/07/2019 2150   EOSABS 0.2 01/07/2019 2150   BASOSABS 0.0 01/07/2019 2150    BMET    Component Value Date/Time   NA 137 01/07/2019 2150   K 4.3 01/07/2019 2150   CL 104 01/07/2019 2150   CO2 23 01/07/2019 2150   GLUCOSE 153 (H) 01/07/2019 2150   BUN 9 01/07/2019 2150   CREATININE 0.87 01/07/2019 2150   CREATININE 0.92 02/18/2018 1031   CALCIUM 10.1 01/07/2019 2150   GFRNONAA >60 01/07/2019 2150   GFRAA >60 01/07/2019 2150    INR    Component Value Date/Time   INR 0.96 01/07/2019 2150     Intake/Output Summary (Last 24 hours) at 01/08/2019 0925 Last data filed at 01/08/2019 0800 Gross per 24 hour  Intake 616.69 ml  Output 300 ml  Net 316.69 ml     Assessment:  68 y.o. male is s/p conversion of left below-knee amputation to above-knee amputation for bleeding with large necrotic ulcer.  Plan: Diet today Physical therapy and will evaluate for rehab Dressing down tomorrow.  Shawn Takeshita C. Donzetta Matters, MD [facility] [place] [facility] Office: [phone] Pager: [phone]  01/08/2019 9:25 AM

## 2019-01-08 NOTE — Plan of Care (Signed)
Acute rehab goals established. 

## 2019-01-08 NOTE — Evaluation (Signed)
Physical Therapy Evaluation Patient Details Name: Shawn [name] MRN: [id number] DOB: 1951-10-07 Today's Date: 01/08/2019   History of Present Illness   68 y.o. 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  Clinical Impression  Patient is s/p above surgery resulting in functional limitations due to the deficits listed below (see PT Problem List). Demonstrates ability to transfer to recliner from bed with min guard assist and no physical help. Pt near baseline per his report, transfers to his motorized wheelchair, and is able to stand with RW but not pivot which he required mod assist for this task today. States his brother will be staying with him at home and prefers to have physical therapy at home again. Patient will benefit from skilled PT to increase their independence and safety with mobility to allow discharge to the venue listed below.       Follow Up Recommendations Home health PT;Supervision/Assistance - 24 hour;Other (comment) Recommendation based on patient report that his brother will be staying with him during recovery (brother not present to confirm.) Without supervision I would recommend SNF placement for a short duration. Pt apparently near baseline with ability to transfer without physical assist today.    Equipment Recommendations  None recommended by PT    Recommendations for Other Services OT consult     Precautions / Restrictions Precautions Precautions: Fall;Other (comment)(AKA) Precaution Comments: Reviewed Restrictions Weight Bearing Restrictions: Yes Other Position/Activity Restrictions: NWB LLE      Mobility  Bed Mobility Overal bed mobility: Needs Assistance Bed Mobility: Supine to Sit     Supine to sit: Min assist;HOB elevated     General bed mobility comments: Min assist for truncal support to rise to EOB.  Transfers Overall transfer  level: Needs assistance Equipment used: None;Rolling walker (2 wheeled) Transfers: Lateral/Scoot Transfers;Sit to/from Stand Sit to Stand: Mod assist        Lateral/Scoot Transfers: Min guard General transfer comment: Close guard for safety, utilized Rt lower limb prosthesis and drop-arm chair for left lateral scoot transfer. Did not require physical assist however close guard for safety and VC for technique provided. Mod assist for boost to stand from recliner, cues for hand placement, fair control with descent and min assist to lower to recliner.  Ambulation/Gait Ambulation/Gait assistance: Min assist Gait Distance (Feet): 0 Feet Assistive device: Rolling walker (2 wheeled)       General Gait Details: Pre-gait task, standing with RW and min assist for stability, tolerated x4 minutes. Cues for COG awareness and weight distribution.  Stairs            Wheelchair Mobility    Modified Rankin (Stroke Patients Only)       Balance Overall balance assessment: Needs assistance Sitting-balance support: Feet unsupported;Single extremity supported Sitting balance-Leahy Scale: Poor     Standing balance support: Bilateral upper extremity supported Standing balance-Leahy Scale: Poor                               Pertinent Vitals/Pain Pain Assessment: 0-10 Pain Score: 6  Pain Location: Lt AKA site Pain Descriptors / Indicators: Aching Pain Intervention(s): Limited activity within patient's tolerance    Home Living Family/patient expects to be discharged to:: Private residence Living Arrangements: Alone Available Help at Discharge: Family;Available 24 hours/day(States his brother willl be staying with him during recovery) Type of Home:  Apartment Home Access: Ramped entrance     Home Layout: One level Home Equipment: Shower seat - built in;Walker - 2 wheels;Bedside commode;Wheelchair - power Additional Comments: States he does not get into his shower.     Prior Function Level of Independence: Independent with assistive device(s)         Comments: prior to admission, pt used powerchair to mobilize, could stand at EOB with rolling walker but not ambulate or pivot, was bathing at sink, nurse would help with dressing 3x/week, and had a short stint with physical therapy until his wound declined. His brother is able to stay with him for an undetermined amount of time.      Hand Dominance   Dominant Hand: Right    Extremity/Trunk Assessment   Upper Extremity Assessment Upper Extremity Assessment: Defer to OT evaluation    Lower Extremity Assessment Lower Extremity Assessment: LLE deficits/detail LLE Deficits / Details: Lt wound bandaged       Communication   Communication: No difficulties  Cognition Arousal/Alertness: Awake/alert Behavior During Therapy: WFL for tasks assessed/performed Overall Cognitive Status: Within Functional Limits for tasks assessed                                        General Comments      Exercises Amputee Exercises Towel Squeeze: Strengthening;Left;5 reps;Seated Hip ABduction/ADduction: Both;5 reps;Supine;AROM Hip Flexion/Marching: Strengthening;Left;5 reps;Seated Straight Leg Raises: Strengthening;Left;5 reps;Supine Chair Push Up: Strengthening;Both;5 reps;Seated   Assessment/Plan    PT Assessment Patient needs continued PT services  PT Problem List Decreased strength;Decreased range of motion;Decreased activity tolerance;Decreased balance;Decreased mobility;Decreased knowledge of use of DME;Decreased knowledge of precautions;Pain       PT Treatment Interventions DME instruction;Functional mobility training;Therapeutic activities;Therapeutic exercise;Balance training;Neuromuscular re-education;Patient/family education;Modalities;Wheelchair mobility training    PT Goals (Current goals can be found in the Care Plan section)  Acute Rehab PT Goals Patient Stated Goal: Go home  with brother, get therapy there. PT Goal Formulation: With patient Time For Goal Achievement: 01/22/19 Potential to Achieve Goals: Fair    Frequency Min 3X/week   Barriers to discharge Decreased caregiver support Brother will be staying with pt for a period of time that they are unsure of.    Co-evaluation               AM-PAC PT "6 Clicks" Mobility  Outcome Measure Help needed turning from your back to your side while in a flat bed without using bedrails?: A Little Help needed moving from lying on your back to sitting on the side of a flat bed without using bedrails?: A Little Help needed moving to and from a bed to a chair (including a wheelchair)?: A Little Help needed standing up from a chair using your arms (e.g., wheelchair or bedside chair)?: A Lot Help needed to walk in hospital room?: Total Help needed climbing 3-5 steps with a railing? : Total 6 Click Score: 13    End of Session Equipment Utilized During Treatment: Gait belt Activity Tolerance: Patient tolerated treatment well Patient left: in chair;with call bell/phone within reach;with chair alarm set;with nursing/sitter in room Nurse Communication: Mobility status PT Visit Diagnosis: Other abnormalities of gait and mobility (R26.89);Muscle weakness (generalized) (M62.81);Difficulty in walking, not elsewhere classified (R26.2);Pain Pain - Right/Left: Left Pain - part of body: Leg    Time: 0920-0958 PT Time Calculation (min) (ACUTE ONLY): 38 min   Charges:   PT  Evaluation $PT Eval Moderate Complexity: 1 Mod PT Treatments $Therapeutic Exercise: 8-22 mins $Self Care/Home Management: 8-22        Elayne Snare, PT, DPT  Ellouise Newer 01/08/2019, 10:17 AM

## 2019-01-08 NOTE — Transfer of Care (Signed)
Immediate Anesthesia Transfer of Care Note  Patient: Shawn [name]  Procedure(s) Performed: AMPUTATION ABOVE KNEE (Left )  Patient Location: PACU  Anesthesia Type:General  Level of Consciousness: awake and sedated  Airway & Oxygen Therapy: Patient Spontanous Breathing  Post-op Assessment: Report given to RN and Post -op Vital signs reviewed and stable  Post vital signs: Reviewed and stable  Last Vitals:  Vitals Value Taken Time  BP 139/89 01/08/2019 12:47 AM  Temp 36.1 C 01/08/2019 12:46 AM  Pulse 93 01/08/2019 12:47 AM  Resp 11 01/08/2019 12:47 AM  SpO2 94 % 01/08/2019 12:47 AM  Vitals shown include unvalidated device data.  Last Pain:  Vitals:   01/07/19 2218  TempSrc:   PainSc: 9          Complications: No apparent anesthesia complications

## 2019-01-08 NOTE — Anesthesia Postprocedure Evaluation (Signed)
Anesthesia Post Note  Patient: Shawn [name]  Procedure(s) Performed: AMPUTATION ABOVE KNEE (Left )     Patient location during evaluation: PACU Anesthesia Type: General Level of consciousness: awake and alert Pain management: pain level controlled Vital Signs Assessment: post-procedure vital signs reviewed and stable Respiratory status: spontaneous breathing, nonlabored ventilation and respiratory function stable Cardiovascular status: blood pressure returned to baseline and stable Postop Assessment: no apparent nausea or vomiting Anesthetic complications: no    Last Vitals:  Vitals:   01/08/19 0115 01/08/19 0140  BP: 99/74 99/73  Pulse: 93 95  Resp: 14 18  Temp: (!) 36.4 C 36.6 C  SpO2: 94% 96%    Last Pain:  Vitals:   01/08/19 0158  TempSrc:   PainSc: 5                  [name],W. EDMOND

## 2019-01-08 NOTE — Op Note (Signed)
    Patient name: Shawn [name] MRN: [id number] DOB: Mar 08, 1951 Sex: male  01/08/2019 Pre-operative Diagnosis: Necrotic below-knee amputation on the left Post-operative diagnosis:  Same Surgeon:  Eda Paschal. Donzetta Matters, MD Assistant: Laurence Slate, PA Procedure Performed:  Left above-knee amputation  Indications: 68 year old male underwent left low knee amputation for diabetic foot ulceration in August at [facility].  He has been undergoing wound care that he states was healing well.  Today he had a bleeding episode was found to have very large necrotic ulcer to the BKA site overlying the wound with a concomitant ulcer laterally.  He was indicated for above-the-knee amputation.  Findings: There was adequate bleeding from the SFA to suggest healing.  Muscle anteriorly was dusky although did react to cautery.  Posterior muscle flap all appeared healthy.   Procedure:  The patient was identified in the holding area and taken to the operating room where is placed supine operative table and general anesthesia was induced.  Sterilely prepped and draped in left lower extremity after the tourniquets were removed and a tourniquet was placed in the groin out of the field.  Timeout was called and antibiotics were administered.  We began with fishmouth type incision without inflating our tourniquet.  We dissected down divided the muscle with cautery as well as all subcutaneous tissue.  The periosteum was all elevated the bone was transected with Gigli saw.  Knife was used to create a posterior flap.  The SFA was profusely bleeding this was clamped suture ligated.  The nerve was pulled on tension tied off with Vicryl tie and divided.  Wound was irrigated hemostasis obtained.  The bone was smoothed with rasp.  Fascia was approximated with interrupted 2-0 Vicryl suture.  Skin clips were placed to level the skin.  Sterile dressing was placed.  He was awake from anesthesia and tolerated procedure without immediate  complication.  All counts were correct at completion.  EBL: 100 cc    C. Donzetta Matters, MD [facility] Office: [phone] Pager: [phone]

## 2019-01-08 NOTE — Evaluation (Signed)
Occupational Therapy Evaluation Patient Details Name: Shawn [name] MRN: [id number] DOB: 02-21-1951 Today's Date: 01/08/2019    History of Present Illness  68 y.o. 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   Clinical Impression   Pt admitted with above. He demonstrates the below listed deficits and will benefit from continued OT to maximize safety and independence with BADLs.  Pt presents to OT with generalized weakness, increased pain, and decreased balance. He currently is able to perform ADLs with min guard - min  assist in seated and supine position and min A - min guard assist for functional transfers. PTA, he was living alone, was mod I for ADLs and IADLs at w/c level, but required assist for transportation.  Recommend Home with 24 hour supervision and HHOT at discharge.       Follow Up Recommendations  Home health OT;Supervision/Assistance - 24 hour    Equipment Recommendations  None recommended by OT(has DME )    Recommendations for Other Services       Precautions / Restrictions Precautions Precautions: Fall;Other (comment)(AKA) Precaution Comments: Reviewed Restrictions Weight Bearing Restrictions: Yes Other Position/Activity Restrictions: NWB LLE      Mobility Bed Mobility Overal bed mobility: Needs Assistance Bed Mobility: Supine to Sit;Sit to Supine     Supine to sit: HOB elevated;Min guard Sit to supine: Min guard   General bed mobility comments: min guard assist for safety   Transfers                 General transfer comment: Pt deferred due to just returning to bed     Balance Overall balance assessment: Needs assistance Sitting-balance support: Feet unsupported Sitting balance-Leahy Scale: Fair Sitting balance - Comments: able to maintain static sitting with min guard assist EOB                                   ADL either performed or assessed with clinical judgement   ADL Overall ADL's : Needs assistance/impaired Eating/Feeding: Independent   Grooming: Wash/dry hands;Wash/dry face;Oral care;Brushing hair;Set up;Sitting   Upper Body Bathing: Set up;Supervision/ safety;Sitting   Lower Body Bathing: Set up;Supervison/ safety;Sitting/lateral leans;Bed level   Upper Body Dressing : Set up;Supervision/safety;Sitting   Lower Body Dressing: Sitting/lateral leans;Minimal assistance;Bed level Lower Body Dressing Details (indicate cue type and reason): Pt able to independently don/doff prosthesis while EOB  Toilet Transfer: Min Psychiatric nurse Details (indicate cue type and reason): simulated anterior/posterior Toileting- Clothing Manipulation and Hygiene: Minimal assistance;Sit to/from stand       Functional mobility during ADLs: Min guard;Minimal assistance General ADL Comments: Pt leans side to side to pull pants over hips and perform peri care.  He simulated this with min - min guard assist in the bed      Vision         Perception     Praxis      Pertinent Vitals/Pain Pain Assessment: 0-10 Pain Score: 5  Pain Location: Lt AKA site Pain Descriptors / Indicators: Aching Pain Intervention(s): Monitored during session;Limited activity within patient's tolerance     Hand Dominance Right   Extremity/Trunk Assessment Upper Extremity Assessment Upper Extremity Assessment: Overall WFL for tasks assessed   Lower Extremity Assessment LLE Deficits / Details: Lt wound bandaged   Cervical / Trunk Assessment Cervical / Trunk Assessment:  Normal   Communication Communication Communication: No difficulties   Cognition Arousal/Alertness: Awake/alert Behavior During Therapy: WFL for tasks assessed/performed Overall Cognitive Status: Within Functional Limits for tasks assessed                                     General Comments       Exercises     Shoulder  Instructions      Home Living Family/patient expects to be discharged to:: Private residence Living Arrangements: Alone Available Help at Discharge: Family;Available 24 hours/day(States his brother willl be staying with him during recovery) Type of Home: Apartment Home Access: Ramped entrance     Home Layout: One level     Bathroom Shower/Tub: Tub only     Bathroom Accessibility: Yes   Home Equipment: Shower seat - built in;Walker - 2 wheels;Bedside commode;Wheelchair - power;Grab bars - toilet   Additional Comments: States he does not get into his shower.      Prior Functioning/Environment Level of Independence: Independent with assistive device(s)        Comments: prior to admission, pt used powerchair to mobilize, could stand at EOB with rolling walker but not ambulate or pivot, was bathing at sink, nurse would help with dressing 3x/week, and had a short stint with physical therapy until his wound declined. His brother is able to stay with him for an undetermined amount of time.         OT Problem List: Decreased strength;Decreased activity tolerance;Impaired balance (sitting and/or standing);Pain      OT Treatment/Interventions: Self-care/ADL training;Therapeutic exercise;DME and/or AE instruction;Therapeutic activities;Patient/family education;Balance training    OT Goals(Current goals can be found in the care plan section) Acute Rehab OT Goals Patient Stated Goal: Pt want to return home and regain independence  OT Goal Formulation: With patient Time For Goal Achievement: 01/22/19 Potential to Achieve Goals: Good ADL Goals Pt Will Perform Grooming: with modified independence;sitting Pt Will Perform Upper Body Bathing: with modified independence;sitting Pt Will Perform Lower Body Bathing: with modified independence;sitting/lateral leans;bed level Pt Will Perform Upper Body Dressing: with modified independence;sitting Pt Will Perform Lower Body Dressing: with  modified independence;sitting/lateral leans;bed level Pt Will Transfer to Toilet: with modified independence;bedside commode;anterior/posterior transfer;grab bars Pt Will Perform Toileting - Clothing Manipulation and hygiene: sit to/from stand;with modified independence  OT Frequency: Min 2X/week   Barriers to D/C:    Pt reports his brother will stay with him at discharge        Co-evaluation              AM-PAC OT "6 Clicks" Daily Activity     Outcome Measure Help from another person eating meals?: None Help from another person taking care of personal grooming?: A Little Help from another person toileting, which includes using toliet, bedpan, or urinal?: A Little Help from another person bathing (including washing, rinsing, drying)?: A Little Help from another person to put on and taking off regular upper body clothing?: A Little Help from another person to put on and taking off regular lower body clothing?: A Little 6 Click Score: 19   End of Session Equipment Utilized During Treatment: Other (comment)(prosthesis ) Nurse Communication: Mobility status  Activity Tolerance: Patient tolerated treatment well Patient left: in bed;with call bell/phone within reach  OT Visit Diagnosis: Muscle weakness (generalized) (M62.81);Pain Pain - Right/Left: Left Pain - part of body: Leg  Time: 7573-2256 OT Time Calculation (min): 22 min Charges:  OT General Charges $OT Visit: 1 Visit OT Evaluation $OT Eval Moderate Complexity: 1 Mod  Lucille Passy, OTR/L Acute Rehabilitation Services Pager [phone] Office [phone]   Lucille Passy M 01/08/2019, 2:49 PM

## 2019-01-08 NOTE — Anesthesia Procedure Notes (Signed)
Procedure Name: Intubation Date/Time: 01/08/2019 12:00 AM Performed by: Valetta Fuller, CRNA Pre-anesthesia Checklist: Patient identified, Emergency Drugs available, Suction available and Patient being monitored Patient Re-evaluated:Patient Re-evaluated prior to induction Oxygen Delivery Method: Circle system utilized Preoxygenation: Pre-oxygenation with 100% oxygen Induction Type: IV induction, Rapid sequence and Cricoid Pressure applied Laryngoscope Size: Miller and 2 Grade View: Grade I Tube type: Oral Tube size: 7.5 mm Number of attempts: 1 Airway Equipment and Method: Stylet Placement Confirmation: ETT inserted through vocal cords under direct vision,  positive ETCO2 and breath sounds checked- equal and bilateral Secured at: 23 cm Tube secured with: Tape Dental Injury: Teeth and Oropharynx as per pre-operative assessment

## 2019-01-09 LAB — BASIC METABOLIC PANEL
Anion gap: 8 (ref 5–15)
BUN: 12 mg/dL (ref 8–23)
CHLORIDE: 105 mmol/L (ref 98–111)
CO2: 25 mmol/L (ref 22–32)
Calcium: 9.1 mg/dL (ref 8.9–10.3)
Creatinine, Ser: 0.98 mg/dL (ref 0.61–1.24)
GFR calc Af Amer: >60 mL/min (ref >60)
GFR calc non Af Amer: >60 mL/min (ref >60)
Glucose, Bld: 130 mg/dL — ABNORMAL HIGH (ref 70–99)
Potassium: 4 mmol/L (ref 3.5–5.1)
Sodium: 138 mmol/L (ref 135–145)

## 2019-01-09 LAB — CBC
HCT: 31.6 % — ABNORMAL LOW (ref 39.0–52.0)
Hemoglobin: 10.5 g/dL — ABNORMAL LOW (ref 13.0–17.0)
MCH: 30.8 pg (ref 26.0–34.0)
MCHC: 33.2 g/dL (ref 30.0–36.0)
MCV: 92.7 fL (ref 80.0–100.0)
Platelets: 215 10*3/uL (ref 150–400)
RBC: 3.41 MIL/uL — AB (ref 4.22–5.81)
RDW: 16.3 % — ABNORMAL HIGH (ref 11.5–15.5)
WBC: 10.1 10*3/uL (ref 4.0–10.5)
nRBC: 0 % (ref 0.0–0.2)

## 2019-01-09 LAB — GLUCOSE, CAPILLARY
Glucose-Capillary: 122 mg/dL — ABNORMAL HIGH (ref 70–99)
Glucose-Capillary: 62 mg/dL — ABNORMAL LOW (ref 70–99)
Glucose-Capillary: 77 mg/dL (ref 70–99)
Glucose-Capillary: 81 mg/dL (ref 70–99)

## 2019-01-09 NOTE — Progress Notes (Signed)
  Progress Note    01/09/2019 2:35 PM 2 Days Post-Op  Subjective: No overnight issues tolerating diet   Vitals:   01/09/19 0553 01/09/19 1408  BP: 120/88 119/73  Pulse: (!) 109 (!) 106  Resp: 16 16  Temp: 98.3 F (36.8 C) 98.1 F (36.7 C)  SpO2: 98% 98%    Physical Exam: Awake alert oriented Nonlabored respirations Abdomen soft Left above-knee amputation site clean dry intact with staples in place  CBC    Component Value Date/Time   WBC 10.1 01/09/2019 0531   RBC 3.41 (L) 01/09/2019 0531   HGB 10.5 (L) 01/09/2019 0531   HCT 31.6 (L) 01/09/2019 0531   PLT 215 01/09/2019 0531   MCV 92.7 01/09/2019 0531   MCH 30.8 01/09/2019 0531   MCHC 33.2 01/09/2019 0531   RDW 16.3 (H) 01/09/2019 0531   LYMPHSABS 2.5 01/07/2019 2150   MONOABS 0.6 01/07/2019 2150   EOSABS 0.2 01/07/2019 2150   BASOSABS 0.0 01/07/2019 2150    BMET    Component Value Date/Time   NA 138 01/09/2019 0531   K 4.0 01/09/2019 0531   CL 105 01/09/2019 0531   CO2 25 01/09/2019 0531   GLUCOSE 130 (H) 01/09/2019 0531   BUN 12 01/09/2019 0531   CREATININE 0.98 01/09/2019 0531   CREATININE 0.92 02/18/2018 1031   CALCIUM 9.1 01/09/2019 0531   GFRNONAA >60 01/09/2019 0531   GFRAA >60 01/09/2019 0531    INR    Component Value Date/Time   INR 0.96 01/07/2019 2150     Intake/Output Summary (Last 24 hours) at 01/09/2019 1435 Last data filed at 01/09/2019 0900 Gross per 24 hour  Intake 480 ml  Output -  Net 480 ml     Assessment:  68 y.o. male is s/p conversion of left below knee to above-knee amputation for significant ulceration that led to bleeding requiring tourniquet.  Plan: Acute blood loss anemia: He is stable hemodynamically and we will continue to monitor  Disposition: Brother will apparently stay with him and so he will need home PT and OT  DVT prophylaxis: Subcutaneous heparin   Brandon C. Donzetta Matters, MD [facility] Office: [phone] Pager:  [phone]  01/09/2019 2:35 PM

## 2019-01-09 NOTE — Plan of Care (Signed)
  Problem: Clinical Measurements: Goal: Ability to maintain clinical measurements within normal limits will improve Outcome: Progressing Goal: Will remain free from infection Outcome: Progressing   Problem: Clinical Measurements: Goal: Will remain free from infection Outcome: Progressing   Problem: Pain Managment: Goal: General experience of comfort will improve Outcome: Progressing   Problem: Safety: Goal: Ability to remain free from injury will improve Outcome: Progressing   Problem: Skin Integrity: Goal: Risk for impaired skin integrity will decrease Outcome: Progressing

## 2019-01-09 NOTE — Progress Notes (Signed)
Inpatient Rehabilitation Admissions Coordinator  Noted PT and OT recommending [facility]. We will sign off at this time.  Danne Baxter, RN, MSN Rehab Admissions Coordinator [phone] 01/09/2019 7:54 PM

## 2019-01-10 LAB — BASIC METABOLIC PANEL
Anion gap: 12 (ref 5–15)
BUN: 17 mg/dL (ref 8–23)
CO2: 24 mmol/L (ref 22–32)
Calcium: 8.9 mg/dL (ref 8.9–10.3)
Chloride: 101 mmol/L (ref 98–111)
Creatinine, Ser: 1.04 mg/dL (ref 0.61–1.24)
GFR calc Af Amer: >60 mL/min (ref >60)
Glucose, Bld: 74 mg/dL (ref 70–99)
Potassium: 3.8 mmol/L (ref 3.5–5.1)
Sodium: 137 mmol/L (ref 135–145)

## 2019-01-10 LAB — CBC
HCT: 30.8 % — ABNORMAL LOW (ref 39.0–52.0)
HEMOGLOBIN: 10 g/dL — AB (ref 13.0–17.0)
MCH: 30.8 pg (ref 26.0–34.0)
MCHC: 32.5 g/dL (ref 30.0–36.0)
MCV: 94.8 fL (ref 80.0–100.0)
Platelets: 206 10*3/uL (ref 150–400)
RBC: 3.25 MIL/uL — ABNORMAL LOW (ref 4.22–5.81)
RDW: 16.3 % — ABNORMAL HIGH (ref 11.5–15.5)
WBC: 11.4 10*3/uL — ABNORMAL HIGH (ref 4.0–10.5)
nRBC: 0 % (ref 0.0–0.2)

## 2019-01-10 LAB — GLUCOSE, CAPILLARY
GLUCOSE-CAPILLARY: 61 mg/dL — AB (ref 70–99)
Glucose-Capillary: 62 mg/dL — ABNORMAL LOW (ref 70–99)
Glucose-Capillary: 54 mg/dL — ABNORMAL LOW (ref 70–99)
Glucose-Capillary: 66 mg/dL — ABNORMAL LOW (ref 70–99)
Glucose-Capillary: 106 mg/dL — ABNORMAL HIGH (ref 70–99)
Glucose-Capillary: 52 mg/dL — ABNORMAL LOW (ref 70–99)
Glucose-Capillary: 63 mg/dL — ABNORMAL LOW (ref 70–99)
Glucose-Capillary: 80 mg/dL (ref 70–99)
Glucose-Capillary: 108 mg/dL — ABNORMAL HIGH (ref 70–99)
Glucose-Capillary: 53 mg/dL — ABNORMAL LOW (ref 70–99)
Glucose-Capillary: 55 mg/dL — ABNORMAL LOW (ref 70–99)
Glucose-Capillary: 76 mg/dL (ref 70–99)
Glucose-Capillary: 77 mg/dL (ref 70–99)

## 2019-01-10 MED ORDER — INSULIN ASPART 100 UNIT/ML ~~LOC~~ SOLN: 0.0000 [IU] | Freq: Three times a day (TID) | SUBCUTANEOUS | Status: DC

## 2019-01-10 MED ORDER — DEXTROSE 50 % IV SOLN
INTRAVENOUS | Status: AC
  Filled 2019-01-10: qty 50

## 2019-01-10 MED ORDER — DEXTROSE 50 % IV SOLN
25.0000 mL | Freq: Once | INTRAVENOUS | Status: AC
  Administered 2019-01-10: 25 mL via INTRAVENOUS

## 2019-01-10 MED ORDER — DEXTROSE-NACL 5-0.9 % IV SOLN
INTRAVENOUS | Status: DC
  Administered 2019-01-10 – 2019-01-11 (×2): via INTRAVENOUS

## 2019-01-10 MED ORDER — DEXTROSE 50 % IV SOLN
INTRAVENOUS | Status: AC
  Administered 2019-01-10: 25 mL via INTRAVENOUS
  Filled 2019-01-10: qty 50

## 2019-01-10 MED ORDER — GLUCOSE 40 % PO GEL
1.0000 | Freq: Once | ORAL | Status: AC
  Administered 2019-01-10: 37.5 g via ORAL
  Filled 2019-01-10: qty 1

## 2019-01-10 MED ORDER — GLUCOSE 40 % PO GEL
ORAL | Status: AC
  Administered 2019-01-10: 12:00:00
  Filled 2019-01-10: qty 1

## 2019-01-10 NOTE — Progress Notes (Addendum)
Hypoglycemic Event  CBG: 61  Treatment: 1 tube glucose gel  Symptoms: None  Follow-up CBG: Time:1657 CBG Result:55  Possible Reasons for Event: Inadequate meal intake  Comments/MD notified:   D50 25 ml given.  Blood sugar 108.    Shawn [name]

## 2019-01-10 NOTE — Progress Notes (Addendum)
Inpatient Diabetes Program Recommendations  AACE/ADA: New Consensus Statement on Inpatient Glycemic Control (2015)  Target Ranges:  Prepandial:   less than 140 mg/dL      Peak postprandial:   less than 180 mg/dL (1-2 hours)      Critically ill patients:  140 - 180 mg/dL   Lab Results  Component Value Date   GLUCAP 63 (L) 01/10/2019    Review of Glycemic Control Results for Shawn, [name] (MRN [id number]) as of 01/10/2019 12:05  Ref. Range 01/09/2019 11:09 01/09/2019 16:14 01/09/2019 21:47 01/10/2019 06:17 01/10/2019 06:47 01/10/2019 07:13 01/10/2019 07:37 01/10/2019 11:35 01/10/2019 12:00  Glucose-Capillary Latest Ref Range: 70 - 99 mg/dL 62 (L) 77 81 62 (L) 66 (L) 54 (L) 106 (H) 52 (L) 63 (L)   Diabetes history: DM 2 Outpatient Diabetes medications:  Glucotrol XL 10 mg daily with breakfast, Tradjenta 5 mg daily, Metformin 1000 mg bid, Actos 15 mg daily Current orders for Inpatient glycemic control:  Tradjenta 5 mg daily, Metformin 500 mg bid, Actos 15 mg daily, Glucotrol  10 mg daily  Inpatient Diabetes Program Recommendations:    Referral received.  Note that patient is on several PO diabetes medications.  While in the hospital, please consider holding oral diabetes medications.  Continue checking blood sugars and if >140 mg/dL, add Novolog sensitive tid with meals.   ** When discharged, may consider d/c of Glucotrol and have patient f/u with PCP due to hypoglycemia in the hospital.   Thanks  Adah Perl, RN, BC-ADM Inpatient Diabetes Coordinator Pager [phone] (8a-5p)

## 2019-01-10 NOTE — Progress Notes (Signed)
CBG is now normal. cbg 106. Day shift RN is aware.

## 2019-01-10 NOTE — Care Management Important Message (Signed)
Important Message  Patient Details  Name: Shawn [name] MRN: [id number] Date of Birth: 09/20/1951   Medicare Important Message Given:  Yes    Tommy Medal 01/10/2019, 4:34 PM

## 2019-01-10 NOTE — Progress Notes (Signed)
CRITICAL VALUE ALERT  Critical Value: 54  Date & Time Notied:  5361   Pt given 25 ml dextrose. Day shift RN aware. This RN will recheck CBG.

## 2019-01-10 NOTE — Progress Notes (Addendum)
Dr Donzetta Matters notified of low blood sugars this morning. Will hold oral hypoglycemics. Will consult Diabetic coordinator. Encouraged pt to increase oral intake, poor appetite for breakfast, lunch and dinner. See hypoglycemic events documentation.

## 2019-01-10 NOTE — Progress Notes (Signed)
Hypoglycemic Event  CBG: 53  Treatment: 8 oz juice/soda  Symptoms: None  Follow-up CBG: Time:12 CBG Result:62  Possible Reasons for Event: Other: no appetite to eat.  Comments/MD notified:Dr Donzetta Matters notified.  1200 Glucose gel 4oz given. Blood sugar checked at 1220. 80.     Shawn [name]

## 2019-01-10 NOTE — Progress Notes (Signed)
Occupational Therapy Treatment Patient Details Name: EURA [name] MRN: [id number] DOB: 1951/12/14 Today's Date: 01/10/2019    History of present illness  68 y.o. 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   OT comments  Pt progressing towards OT goals this session, overall min guard assist for transfer to recliner and set up for grooming tasks. Pt agreeable to sit in recliner at end of session. Prosthetic in room. Current POC remains appropriate. Will need to confirm 24 hour supervision for safety - if he does not have 24 hour supervision, a SNF would be safer and it would allow him more therapy.   Follow Up Recommendations  Home health OT;Supervision/Assistance - 24 hour    Equipment Recommendations  None recommended by OT(Pt has appropriate DME)    Recommendations for Other Services      Precautions / Restrictions Precautions Precautions: Fall;Other (comment)(new AKA (was BKA)) Restrictions Weight Bearing Restrictions: Yes LLE Weight Bearing: Non weight bearing       Mobility Bed Mobility Overal bed mobility: Needs Assistance Bed Mobility: Supine to Sit     Supine to sit: HOB elevated;Min guard     General bed mobility comments: min guard for safety  Transfers Overall transfer level: Needs assistance Equipment used: None Transfers: Comptroller transfers: Min guard   General transfer comment: built up seat of recliner to make it easier to get back to bed    Balance Overall balance assessment: Needs assistance Sitting-balance support: Feet unsupported Sitting balance-Leahy Scale: Fair Sitting balance - Comments: able to maintain static sitting with min guard assist EOB                                    ADL either performed or assessed with clinical judgement   ADL Overall ADL's : Needs  assistance/impaired     Grooming: Wash/dry hands;Wash/dry face;Oral care;Brushing hair;Set up;Sitting Grooming Details (indicate cue type and reason): in recliner                 Toilet Transfer: Min guard;Anterior/posterior Armed forces technical officer Details (indicate cue type and reason): to recliner                 Vision       Perception     Praxis      Cognition Arousal/Alertness: Awake/alert Behavior During Therapy: WFL for tasks assessed/performed Overall Cognitive Status: Within Functional Limits for tasks assessed                                          Exercises     Shoulder Instructions       General Comments      Pertinent Vitals/ Pain       Pain Assessment: Faces Faces Pain Scale: Hurts little more Pain Location: Lt AKA site Pain Descriptors / Indicators: Aching Pain Intervention(s): Monitored during session;Repositioned  Home Living                                          Prior Functioning/Environment  Frequency  Min 2X/week        Progress Toward Goals  OT Goals(current goals can now be found in the care plan section)  Progress towards OT goals: Progressing toward goals  Acute Rehab OT Goals Patient Stated Goal: Pt want to return home and regain independence  OT Goal Formulation: With patient Time For Goal Achievement: 01/22/19 Potential to Achieve Goals: Good  Plan Discharge plan remains appropriate;Frequency remains appropriate(As long as 24 hour supervision can be confirmed)    Co-evaluation                 AM-PAC OT "6 Clicks" Daily Activity     Outcome Measure   Help from another person eating meals?: None Help from another person taking care of personal grooming?: A Little Help from another person toileting, which includes using toliet, bedpan, or urinal?: A Little Help from another person bathing (including washing, rinsing, drying)?: A Little Help from another  person to put on and taking off regular upper body clothing?: A Little Help from another person to put on and taking off regular lower body clothing?: A Little 6 Click Score: 19    End of Session    OT Visit Diagnosis: Muscle weakness (generalized) (M62.81);Pain Pain - Right/Left: Left Pain - part of body: Leg   Activity Tolerance Patient tolerated treatment well   Patient Left in chair;with call bell/phone within reach   Nurse Communication Mobility status        Time: 3716-9678 OT Time Calculation (min): 23 min  Charges: OT General Charges $OT Visit: 1 Visit OT Treatments $Self Care/Home Management : 23-37 mins  Hulda Humphrey OTR/L Acute Rehabilitation Services Pager: [phone] Office: [place] 01/10/2019, 2:12 PM

## 2019-01-10 NOTE — Progress Notes (Signed)
  Progress Note    01/10/2019 5:27 PM 3 Days Post-Op  Subjective: Hypoglycemia this morning.  Currently feeling well  Vitals:   01/10/19 0335 01/10/19 1440  BP: 130/81 130/79  Pulse: (!) 114 (!) 108  Resp: 16 16  Temp: 98.7 F (37.1 C) 98.2 F (36.8 C)  SpO2: 98% 99%    Physical Exam: Awake alert oriented Left above-knee amputation site healing well with staples  CBC    Component Value Date/Time   WBC 11.4 (H) 01/10/2019 0400   RBC 3.25 (L) 01/10/2019 0400   HGB 10.0 (L) 01/10/2019 0400   HCT 30.8 (L) 01/10/2019 0400   PLT 206 01/10/2019 0400   MCV 94.8 01/10/2019 0400   MCH 30.8 01/10/2019 0400   MCHC 32.5 01/10/2019 0400   RDW 16.3 (H) 01/10/2019 0400   LYMPHSABS 2.5 01/07/2019 2150   MONOABS 0.6 01/07/2019 2150   EOSABS 0.2 01/07/2019 2150   BASOSABS 0.0 01/07/2019 2150    BMET    Component Value Date/Time   NA 137 01/10/2019 0400   K 3.8 01/10/2019 0400   CL 101 01/10/2019 0400   CO2 24 01/10/2019 0400   GLUCOSE 74 01/10/2019 0400   BUN 17 01/10/2019 0400   CREATININE 1.04 01/10/2019 0400   CREATININE 0.92 02/18/2018 1031   CALCIUM 8.9 01/10/2019 0400   GFRNONAA >60 01/10/2019 0400   GFRAA >60 01/10/2019 0400    INR    Component Value Date/Time   INR 0.96 01/07/2019 2150     Intake/Output Summary (Last 24 hours) at 01/10/2019 1727 Last data filed at 01/10/2019 1300 Gross per 24 hour  Intake 720 ml  Output 300 ml  Net 420 ml     Assessment/plan:  68 y.o. male is s/p conversion of left below knee to the left above-knee amputation.  Hypoglycemia has been evaluated by diabetes coordinator and is much appreciated.  He will go home with PT and OT.   Milaya Hora C. Donzetta Matters, MD [facility] [place] Office: [phone] Pager: [phone]  01/10/2019 5:27 PM

## 2019-01-10 NOTE — Progress Notes (Addendum)
CRITICAL VALUE ALERT  Critical Value:  CBG 62   Date & Time Notied:  01/10/19 0615 will recheck CBG    Pt given apple sauce, crackers and peanut butter since we were out of Orange juice.  CBG recheck @ 3202 33 will recheck CBG

## 2019-01-10 NOTE — Progress Notes (Signed)
PT Cancellation Note  Patient Details Name: Shawn [name] MRN: [id number] DOB: 17-May-1951   Cancelled Treatment:    Reason Eval/Treat Not Completed: Patient declined, no reason specified, patient reports he just got back into bed. Will re-attempt tomorrow.    [name] 01/10/2019, 2:54 PM

## 2019-01-11 ENCOUNTER — Telehealth: Payer: Self-pay | Admitting: Vascular Surgery

## 2019-01-11 LAB — GLUCOSE, CAPILLARY
GLUCOSE-CAPILLARY: 70 mg/dL (ref 70–99)
GLUCOSE-CAPILLARY: 106 mg/dL — AB (ref 70–99)
Glucose-Capillary: 70 mg/dL (ref 70–99)

## 2019-01-11 MED ORDER — HYDROCODONE-ACETAMINOPHEN 10-325 MG PO TABS: 1.0000 | ORAL_TABLET | Freq: Four times a day (QID) | ORAL | 0 refills | Status: DC | PRN

## 2019-01-11 NOTE — Telephone Encounter (Signed)
sch appt lvm mld ltr 02/18/2019 10am p/o MD

## 2019-01-11 NOTE — Telephone Encounter (Signed)
-----   Message from Gabriel Earing, [place] sent at 01/11/2019  8:04 AM EST ----- S/p conversion of left below knee to the left above-knee amputation.   F/u with Dr. Donzetta Matters in 4 weeks.  Thanks

## 2019-01-11 NOTE — Discharge Instructions (Signed)
May shower starting 01/12/2019.   Use stump sock daily.

## 2019-01-11 NOTE — Discharge Summary (Signed)
Discharge Summary    Shawn [name] 11-06-1951 68 y.o. male  [id number]  Admission Date: 01/07/2019  Discharge Date: 01/11/2019  Physician: Thomes Lolling*  Admission Diagnosis: Amputation stump injury [T14.8XXA] PAD (peripheral artery disease) ([place]) [I73.9]   HPI:   This is a 68 y.o. male who underwent left low knee amputation for diabetic foot ulceration in August at [facility].  He has been undergoing wound care that he states was healing well.  Today he had a bleeding episode was found to have very large necrotic ulcer to the BKA site overlying the wound with a concomitant ulcer laterally.  He was indicated for above-the-knee amputation.  Hospital Course:  The patient was admitted to the hospital and taken to the operating room on 01/08/2019 and underwent: Left above-knee amputation    Findings: There was adequate bleeding from the SFA to suggest healing.  Muscle anteriorly was dusky although did react to cautery.  Posterior muscle flap all appeared healthy.  The pt tolerated the procedure well and was transported to the PACU in good condition.   By POD 1, the was doing well, his diet advanced.  PT recommended HHPT.  By POD 2, he was doing well.  He did have acute blood loss anemia that he was tolerating.   DM coordinator was consulted.  She recommended discontinuing his Glucotrol at discharge and f/u with PCP to re-evaluate.  He was discharged home.  The remainder of the hospital course consisted of increasing mobilization and increasing intake of solids without difficulty.  CBC    Component Value Date/Time   WBC 11.4 (H) 01/10/2019 0400   RBC 3.25 (L) 01/10/2019 0400   HGB 10.0 (L) 01/10/2019 0400   HCT 30.8 (L) 01/10/2019 0400   PLT 206 01/10/2019 0400   MCV 94.8 01/10/2019 0400   MCH 30.8 01/10/2019 0400   MCHC 32.5 01/10/2019 0400   RDW 16.3 (H) 01/10/2019 0400   LYMPHSABS 2.5 01/07/2019 2150   MONOABS 0.6 01/07/2019 2150   EOSABS 0.2  01/07/2019 2150   BASOSABS 0.0 01/07/2019 2150    BMET    Component Value Date/Time   NA 137 01/10/2019 0400   K 3.8 01/10/2019 0400   CL 101 01/10/2019 0400   CO2 24 01/10/2019 0400   GLUCOSE 74 01/10/2019 0400   BUN 17 01/10/2019 0400   CREATININE 1.04 01/10/2019 0400   CREATININE 0.92 02/18/2018 1031   CALCIUM 8.9 01/10/2019 0400   GFRNONAA >60 01/10/2019 0400   GFRAA >60 01/10/2019 0400      Discharge Instructions    Discharge patient   Complete by:  As directed    Discharge pt once HH issues are arranged. thanks   Discharge disposition:  01-Home or Self Care   Discharge patient date:  01/11/2019      Discharge Diagnosis:  Amputation stump injury [T14.8XXA] PAD (peripheral artery disease) ([facility]) [I73.9]  Secondary Diagnosis: Patient Active Problem List   Diagnosis Date Noted  . PAD (peripheral artery disease) ([place]) 01/08/2019  . Knee injury, right, initial encounter 01/07/2019  . Osteomyelitis of hand, left, acute ([place]) 02/18/2018   Past Medical History:  Diagnosis Date  . Diabetes mellitus without complication ([place])   . Elevated cholesterol      Allergies as of 01/11/2019   No Known Allergies     Medication List    STOP taking these medications   amoxicillin-clavulanate 875-125 MG tablet Commonly known as:  AUGMENTIN   glipiZIDE 10 MG 24 hr tablet Commonly  known as:  GLUCOTROL XL   levofloxacin 500 MG tablet Commonly known as:  LEVAQUIN   metroNIDAZOLE 500 MG tablet Commonly known as:  FLAGYL     TAKE these medications   aspirin EC 81 MG tablet Take 81 mg by mouth daily.   DULoxetine 60 MG capsule Commonly known as:  CYMBALTA Take 60 mg by mouth daily.   gabapentin 600 MG tablet Commonly known as:  NEURONTIN Take 600 mg by mouth 3 (three) times daily.   HYDROcodone-acetaminophen 10-325 MG tablet Commonly known as:  NORCO Take 1 tablet by mouth every 6 (six) hours as needed.   lisinopril 2.5 MG tablet Commonly known as:   PRINIVIL,ZESTRIL Take 2.5 mg by mouth daily.   metFORMIN 500 MG tablet Commonly known as:  GLUCOPHAGE Take 1,000 mg by mouth 2 (two) times daily with a meal.   multivitamin with minerals Tabs tablet Take 1 tablet by mouth daily.   naproxen sodium 550 MG tablet Commonly known as:  ANAPROX Take 550 mg by mouth 2 (two) times daily with a meal.   Oxycodone HCl 10 MG Tabs Take 10 mg by mouth every 4 (four) hours as needed (pain).   pioglitazone 15 MG tablet Commonly known as:  ACTOS Take 15 mg by mouth daily.   pravastatin 40 MG tablet Commonly known as:  PRAVACHOL Take 40 mg by mouth daily.   sitaGLIPtin 100 MG tablet Commonly known as:  JANUVIA Take 100 mg by mouth daily.   thiamine 100 MG tablet Take 100 mg by mouth daily.   TRADJENTA PO Take 5 mg by mouth daily.       Prescriptions given: Vicodin#20 No Refill  Instructions: 1.  Shower daily starting 01/12/2019 2.  Stump sock to stump daily  Disposition: home with [facility]  Patient's condition: is Good  Follow up: 1. Dr. Donzetta Matters in 4 weeks 2. F/u with PCP this week to evaluate hypoglycemia and evaluate medications.    Leontine Locket, PA-C [facility] [phone] 01/11/2019  8:04 AM

## 2019-01-11 NOTE — Plan of Care (Signed)

## 2019-01-11 NOTE — Progress Notes (Signed)
Physical Therapy Treatment Patient Details Name: Shawn [name] MRN: [id number] DOB: 1951-03-04 Today's Date: 01/11/2019    History of Present Illness  68 y.o. 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    PT Comments    Patient received in bed. Reluctantly agrees to get up with PT. Patient performs bed mobility with modified independence, HOB elevated, use of rails. Performs transfer sit to stand with min assist. Patient is able to take on scooting step laterally to move up toward [place]. Patient is non ambulatory at baseline, reports he uses motorized wheelchair. Patient will benefit from HHPT as follow up once returned home to ensure his safety with mobility.       Follow Up Recommendations  Home health PT     Equipment Recommendations  None recommended by PT    Recommendations for Other Services       Precautions / Restrictions Precautions Precautions: Fall Restrictions Weight Bearing Restrictions: Yes LLE Weight Bearing: Non weight bearing Other Position/Activity Restrictions: NWB LLE    Mobility  Bed Mobility Overal bed mobility: Independent Bed Mobility: Supine to Sit;Sit to Supine     Supine to sit: HOB elevated;Supervision Sit to supine: HOB elevated;Supervision      Transfers Overall transfer level: Needs assistance Equipment used: Rolling walker (2 wheeled) Transfers: Sit to/from Stand Sit to Stand: Min assist            Ambulation/Gait Ambulation/Gait assistance: Modified independent (Device/Increase time);Min assist Gait Distance (Feet): 1 Feet Assistive device: Rolling walker (2 wheeled)       General Gait Details: able to shuffle one step left to move up toward HOB. Good posture and balance with RW in standing   Stairs             Wheelchair Mobility    Modified Rankin (Stroke Patients Only)       Balance Overall  balance assessment: Modified Independent Sitting-balance support: Bilateral upper extremity supported Sitting balance-Leahy Scale: Fair Sitting balance - Comments: able to maintain static sitting with min guard assist EOB    Standing balance support: Bilateral upper extremity supported Standing balance-Leahy Scale: Good                              Cognition Arousal/Alertness: Awake/alert Behavior During Therapy: WFL for tasks assessed/performed Overall Cognitive Status: Within Functional Limits for tasks assessed                                        Exercises      General Comments        Pertinent Vitals/Pain Pain Assessment: 0-10 Pain Score: 2  Pain Location: Lt AKA site Pain Descriptors / Indicators: Aching Pain Intervention(s): Monitored during session    Home Living                      Prior Function            PT Goals (current goals can now be found in the care plan section) Acute Rehab PT Goals Patient Stated Goal: Pt want to return home and regain independence  PT Goal Formulation: With patient Time For Goal Achievement: 01/22/19 Potential to Achieve Goals: Good Progress towards PT goals: Progressing toward goals  Frequency    Min 3X/week      PT Plan Current plan remains appropriate    Co-evaluation              AM-PAC PT "6 Clicks" Mobility   Outcome Measure  Help needed turning from your back to your side while in a flat bed without using bedrails?: A Little Help needed moving from lying on your back to sitting on the side of a flat bed without using bedrails?: A Little Help needed moving to and from a bed to a chair (including a wheelchair)?: A Little Help needed standing up from a chair using your arms (e.g., wheelchair or bedside chair)?: A Little Help needed to walk in hospital room?: Total Help needed climbing 3-5 steps with a railing? : Total 6 Click Score: 14    End of Session  Equipment Utilized During Treatment: Gait belt Activity Tolerance: Patient tolerated treatment well Patient left: in bed;with call bell/phone within reach Nurse Communication: Mobility status Pain - Right/Left: Left Pain - part of body: Leg     Time: 1030-1314 PT Time Calculation (min) (ACUTE ONLY): 15 min  Charges:  $Therapeutic Activity: 8-22 mins                     Falyn Rubel, PT, GCS 01/11/19,9:53 AM

## 2019-01-11 NOTE — Progress Notes (Signed)
  Progress Note    01/11/2019 8:12 AM 4 Days Post-Op  Subjective: No overnight issues  Vitals:   01/10/19 2111 01/11/19 0250  BP: 106/68 134/89  Pulse: (!) 110 99  Resp: 18 18  Temp: 98.2 F (36.8 C) 97.9 F (36.6 C)  SpO2: 98% 99%    Physical Exam: Awake alert oriented Left above-knee amputation site clean dry intact with staples  CBC    Component Value Date/Time   WBC 11.4 (H) 01/10/2019 0400   RBC 3.25 (L) 01/10/2019 0400   HGB 10.0 (L) 01/10/2019 0400   HCT 30.8 (L) 01/10/2019 0400   PLT 206 01/10/2019 0400   MCV 94.8 01/10/2019 0400   MCH 30.8 01/10/2019 0400   MCHC 32.5 01/10/2019 0400   RDW 16.3 (H) 01/10/2019 0400   LYMPHSABS 2.5 01/07/2019 2150   MONOABS 0.6 01/07/2019 2150   EOSABS 0.2 01/07/2019 2150   BASOSABS 0.0 01/07/2019 2150    BMET    Component Value Date/Time   NA 137 01/10/2019 0400   K 3.8 01/10/2019 0400   CL 101 01/10/2019 0400   CO2 24 01/10/2019 0400   GLUCOSE 74 01/10/2019 0400   BUN 17 01/10/2019 0400   CREATININE 1.04 01/10/2019 0400   CREATININE 0.92 02/18/2018 1031   CALCIUM 8.9 01/10/2019 0400   GFRNONAA >60 01/10/2019 0400   GFRAA >60 01/10/2019 0400    INR    Component Value Date/Time   INR 0.96 01/07/2019 2150     Intake/Output Summary (Last 24 hours) at 01/11/2019 8588 Last data filed at 01/11/2019 0300 Gross per 24 hour  Intake 1224.88 ml  Output -  Net 1224.88 ml     Assessment:  68 y.o. male is s/p conversion of left below-knee amputation to above-knee amputation for bleeding.  Plan: Okay for discharge today Follow-up 4 weeks for staple removal. Home health PT OT  Ha Place[place] C. Donzetta Matters, MD [facility] [place] Office: [phone] Pager: [phone]  01/11/2019 8:12 AM

## 2019-01-11 NOTE — Care Management Note (Signed)
Case Management Note  Patient Details  Name: Shawn [name] MRN: [id number] Date of Birth: 1951-04-21  Subjective/Objective:  68 y.o. male is s/p conversion of left below-knee amputation to above-knee amputation for bleeding with large necrotic ulcer,                  Action/Plan: Case Manager spoke with patient concerning discharge plan. Patient has been active with [facility] and wishes to continue to do so. Case manager called intake with [facility] to confirm, faxed orders and Op notes to [phone] for resumption of care. Patient says he lives with his brother and that family will transport him home.    Expected Discharge Date:  01/11/19               Expected Discharge Plan:  [place]  In-House Referral:  NA  Discharge planning Services  CM Consult  Post Acute Care Choice:  Resumption of Svcs/PTA Provider, Home Health Choice offered to:  Patient  DME Arranged:  N/A DME Agency:  NA  HH Arranged:  PT, OT HH Agency:  [facility]  Status of Service:  Completed, signed off  If discussed at [place] of Stay Meetings, dates discussed:    Additional Comments:  Ninfa Meeker, RN 01/11/2019, 11:11 AM

## 2019-01-11 NOTE — Progress Notes (Signed)
Reviewed AVS discharge instructions with patient/caregiver. Patient/caregiver verbalizes understanding of instructions received. AVS and prescriptions received by patient/caregiver. If present, telemetry box removed and central cardiac monitoring department notified of discharge. Peripheral IV removed, site benign with tip intact. Patient will be transported to discharge lounge to await his ride.

## 2019-01-11 NOTE — Progress Notes (Signed)
Stump socks delivered to room.

## 2019-01-13 DIAGNOSIS — E1165 Type 2 diabetes mellitus with hyperglycemia: Secondary | ICD-10-CM | POA: Diagnosis not present

## 2019-01-13 DIAGNOSIS — Z4781 Encounter for orthopedic aftercare following surgical amputation: Secondary | ICD-10-CM | POA: Diagnosis not present

## 2019-01-13 DIAGNOSIS — E1151 Type 2 diabetes mellitus with diabetic peripheral angiopathy without gangrene: Secondary | ICD-10-CM | POA: Diagnosis not present

## 2019-01-13 DIAGNOSIS — E1149 Type 2 diabetes mellitus with other diabetic neurological complication: Secondary | ICD-10-CM | POA: Diagnosis not present

## 2019-01-13 DIAGNOSIS — I129 Hypertensive chronic kidney disease with stage 1 through stage 4 chronic kidney disease, or unspecified chronic kidney disease: Secondary | ICD-10-CM | POA: Diagnosis not present

## 2019-01-13 DIAGNOSIS — E1122 Type 2 diabetes mellitus with diabetic chronic kidney disease: Secondary | ICD-10-CM | POA: Diagnosis not present

## 2019-01-13 DIAGNOSIS — E1142 Type 2 diabetes mellitus with diabetic polyneuropathy: Secondary | ICD-10-CM | POA: Diagnosis not present

## 2019-01-13 DIAGNOSIS — I2510 Atherosclerotic heart disease of native coronary artery without angina pectoris: Secondary | ICD-10-CM | POA: Diagnosis not present

## 2019-01-13 DIAGNOSIS — N181 Chronic kidney disease, stage 1: Secondary | ICD-10-CM | POA: Diagnosis not present

## 2019-01-14 DIAGNOSIS — E1142 Type 2 diabetes mellitus with diabetic polyneuropathy: Secondary | ICD-10-CM | POA: Diagnosis not present

## 2019-01-14 DIAGNOSIS — N181 Chronic kidney disease, stage 1: Secondary | ICD-10-CM | POA: Diagnosis not present

## 2019-01-14 DIAGNOSIS — I2510 Atherosclerotic heart disease of native coronary artery without angina pectoris: Secondary | ICD-10-CM | POA: Diagnosis not present

## 2019-01-14 DIAGNOSIS — E1149 Type 2 diabetes mellitus with other diabetic neurological complication: Secondary | ICD-10-CM | POA: Diagnosis not present

## 2019-01-14 DIAGNOSIS — I129 Hypertensive chronic kidney disease with stage 1 through stage 4 chronic kidney disease, or unspecified chronic kidney disease: Secondary | ICD-10-CM | POA: Diagnosis not present

## 2019-01-14 DIAGNOSIS — E1122 Type 2 diabetes mellitus with diabetic chronic kidney disease: Secondary | ICD-10-CM | POA: Diagnosis not present

## 2019-01-14 DIAGNOSIS — E1151 Type 2 diabetes mellitus with diabetic peripheral angiopathy without gangrene: Secondary | ICD-10-CM | POA: Diagnosis not present

## 2019-01-14 DIAGNOSIS — Z4781 Encounter for orthopedic aftercare following surgical amputation: Secondary | ICD-10-CM | POA: Diagnosis not present

## 2019-01-14 DIAGNOSIS — E1165 Type 2 diabetes mellitus with hyperglycemia: Secondary | ICD-10-CM | POA: Diagnosis not present

## 2019-01-14 NOTE — Consult Note (Signed)
            [facility] CM Primary Care Navigator  01/14/2019  Shawn [name] 1951/04/04 [id number]   Attempt toseepatient at the bedside to identify possible discharge needs buthewasalready discharged. Patient went home with home health services(resumed with [facility] home health per Inpatient CM note).  Per MD note,patientpresentedwith a bleeding episode to left stump and was found to have very large necrotic ulcer to the BKA- below the knee amputation site overlying the wound with a concomitant ulcer laterally, and he then underwent left AKA- above-the-knee amputation. Inpatient DM coordinator was consulted and recommended discontinuing of Glucotrol at discharge and follow-up with primary care provider to re-evaluate.  Patient has discharge instruction to follow-up with primary care provider within a week- to evaluate hypoglycemia as well as evaluate medications.     Primary care provider's officewascalled (spoke to [place] who states unable to access their patient's being discharge from hospital) to notifyof patient's hospitaldischarge, need for post hospital follow-up and transition of care. Notified ofpatient'shealth issues needingclose follow-up (mainly DM). Was informed that patient's last A1c is 6.6.  Made aware to refer patient to [facility] care management if deemed necessaryandappropriate for services and states they are very much aware of the referral process.    For additional questions please contact:  Edwena Felty A. Nialah Saravia, BSN, RN-BC [facility] PRIMARY CARE Navigator Cell: [phone]

## 2019-01-15 DIAGNOSIS — Z89512 Acquired absence of left leg below knee: Secondary | ICD-10-CM | POA: Diagnosis not present

## 2019-01-18 DIAGNOSIS — E1165 Type 2 diabetes mellitus with hyperglycemia: Secondary | ICD-10-CM | POA: Diagnosis not present

## 2019-01-18 DIAGNOSIS — I2510 Atherosclerotic heart disease of native coronary artery without angina pectoris: Secondary | ICD-10-CM | POA: Diagnosis not present

## 2019-01-18 DIAGNOSIS — I129 Hypertensive chronic kidney disease with stage 1 through stage 4 chronic kidney disease, or unspecified chronic kidney disease: Secondary | ICD-10-CM | POA: Diagnosis not present

## 2019-01-18 DIAGNOSIS — N181 Chronic kidney disease, stage 1: Secondary | ICD-10-CM | POA: Diagnosis not present

## 2019-01-18 DIAGNOSIS — E1149 Type 2 diabetes mellitus with other diabetic neurological complication: Secondary | ICD-10-CM | POA: Diagnosis not present

## 2019-01-18 DIAGNOSIS — E1122 Type 2 diabetes mellitus with diabetic chronic kidney disease: Secondary | ICD-10-CM | POA: Diagnosis not present

## 2019-01-18 DIAGNOSIS — E1151 Type 2 diabetes mellitus with diabetic peripheral angiopathy without gangrene: Secondary | ICD-10-CM | POA: Diagnosis not present

## 2019-01-18 DIAGNOSIS — Z4781 Encounter for orthopedic aftercare following surgical amputation: Secondary | ICD-10-CM | POA: Diagnosis not present

## 2019-01-18 DIAGNOSIS — E1142 Type 2 diabetes mellitus with diabetic polyneuropathy: Secondary | ICD-10-CM | POA: Diagnosis not present

## 2019-01-19 DIAGNOSIS — E1149 Type 2 diabetes mellitus with other diabetic neurological complication: Secondary | ICD-10-CM | POA: Diagnosis not present

## 2019-01-19 DIAGNOSIS — E1151 Type 2 diabetes mellitus with diabetic peripheral angiopathy without gangrene: Secondary | ICD-10-CM | POA: Diagnosis not present

## 2019-01-19 DIAGNOSIS — I2510 Atherosclerotic heart disease of native coronary artery without angina pectoris: Secondary | ICD-10-CM | POA: Diagnosis not present

## 2019-01-19 DIAGNOSIS — N181 Chronic kidney disease, stage 1: Secondary | ICD-10-CM | POA: Diagnosis not present

## 2019-01-19 DIAGNOSIS — E1122 Type 2 diabetes mellitus with diabetic chronic kidney disease: Secondary | ICD-10-CM | POA: Diagnosis not present

## 2019-01-19 DIAGNOSIS — E1165 Type 2 diabetes mellitus with hyperglycemia: Secondary | ICD-10-CM | POA: Diagnosis not present

## 2019-01-19 DIAGNOSIS — I129 Hypertensive chronic kidney disease with stage 1 through stage 4 chronic kidney disease, or unspecified chronic kidney disease: Secondary | ICD-10-CM | POA: Diagnosis not present

## 2019-01-19 DIAGNOSIS — Z4781 Encounter for orthopedic aftercare following surgical amputation: Secondary | ICD-10-CM | POA: Diagnosis not present

## 2019-01-19 DIAGNOSIS — E1142 Type 2 diabetes mellitus with diabetic polyneuropathy: Secondary | ICD-10-CM | POA: Diagnosis not present

## 2019-01-21 DIAGNOSIS — N181 Chronic kidney disease, stage 1: Secondary | ICD-10-CM | POA: Diagnosis not present

## 2019-01-21 DIAGNOSIS — I2510 Atherosclerotic heart disease of native coronary artery without angina pectoris: Secondary | ICD-10-CM | POA: Diagnosis not present

## 2019-01-21 DIAGNOSIS — I129 Hypertensive chronic kidney disease with stage 1 through stage 4 chronic kidney disease, or unspecified chronic kidney disease: Secondary | ICD-10-CM | POA: Diagnosis not present

## 2019-01-21 DIAGNOSIS — E1122 Type 2 diabetes mellitus with diabetic chronic kidney disease: Secondary | ICD-10-CM | POA: Diagnosis not present

## 2019-01-21 DIAGNOSIS — E1149 Type 2 diabetes mellitus with other diabetic neurological complication: Secondary | ICD-10-CM | POA: Diagnosis not present

## 2019-01-21 DIAGNOSIS — E1151 Type 2 diabetes mellitus with diabetic peripheral angiopathy without gangrene: Secondary | ICD-10-CM | POA: Diagnosis not present

## 2019-01-21 DIAGNOSIS — E1165 Type 2 diabetes mellitus with hyperglycemia: Secondary | ICD-10-CM | POA: Diagnosis not present

## 2019-01-21 DIAGNOSIS — E1142 Type 2 diabetes mellitus with diabetic polyneuropathy: Secondary | ICD-10-CM | POA: Diagnosis not present

## 2019-01-21 DIAGNOSIS — Z4781 Encounter for orthopedic aftercare following surgical amputation: Secondary | ICD-10-CM | POA: Diagnosis not present

## 2019-01-24 DIAGNOSIS — E1122 Type 2 diabetes mellitus with diabetic chronic kidney disease: Secondary | ICD-10-CM | POA: Diagnosis not present

## 2019-01-24 DIAGNOSIS — E1142 Type 2 diabetes mellitus with diabetic polyneuropathy: Secondary | ICD-10-CM | POA: Diagnosis not present

## 2019-01-24 DIAGNOSIS — Z4781 Encounter for orthopedic aftercare following surgical amputation: Secondary | ICD-10-CM | POA: Diagnosis not present

## 2019-01-24 DIAGNOSIS — I2510 Atherosclerotic heart disease of native coronary artery without angina pectoris: Secondary | ICD-10-CM | POA: Diagnosis not present

## 2019-01-24 DIAGNOSIS — E1165 Type 2 diabetes mellitus with hyperglycemia: Secondary | ICD-10-CM | POA: Diagnosis not present

## 2019-01-24 DIAGNOSIS — N181 Chronic kidney disease, stage 1: Secondary | ICD-10-CM | POA: Diagnosis not present

## 2019-01-24 DIAGNOSIS — I129 Hypertensive chronic kidney disease with stage 1 through stage 4 chronic kidney disease, or unspecified chronic kidney disease: Secondary | ICD-10-CM | POA: Diagnosis not present

## 2019-01-24 DIAGNOSIS — E1149 Type 2 diabetes mellitus with other diabetic neurological complication: Secondary | ICD-10-CM | POA: Diagnosis not present

## 2019-01-24 DIAGNOSIS — E1151 Type 2 diabetes mellitus with diabetic peripheral angiopathy without gangrene: Secondary | ICD-10-CM | POA: Diagnosis not present

## 2019-01-26 DIAGNOSIS — I2510 Atherosclerotic heart disease of native coronary artery without angina pectoris: Secondary | ICD-10-CM | POA: Diagnosis not present

## 2019-01-26 DIAGNOSIS — E1142 Type 2 diabetes mellitus with diabetic polyneuropathy: Secondary | ICD-10-CM | POA: Diagnosis not present

## 2019-01-26 DIAGNOSIS — E1165 Type 2 diabetes mellitus with hyperglycemia: Secondary | ICD-10-CM | POA: Diagnosis not present

## 2019-01-26 DIAGNOSIS — I129 Hypertensive chronic kidney disease with stage 1 through stage 4 chronic kidney disease, or unspecified chronic kidney disease: Secondary | ICD-10-CM | POA: Diagnosis not present

## 2019-01-26 DIAGNOSIS — E1151 Type 2 diabetes mellitus with diabetic peripheral angiopathy without gangrene: Secondary | ICD-10-CM | POA: Diagnosis not present

## 2019-01-26 DIAGNOSIS — Z4781 Encounter for orthopedic aftercare following surgical amputation: Secondary | ICD-10-CM | POA: Diagnosis not present

## 2019-01-26 DIAGNOSIS — E1149 Type 2 diabetes mellitus with other diabetic neurological complication: Secondary | ICD-10-CM | POA: Diagnosis not present

## 2019-01-26 DIAGNOSIS — N181 Chronic kidney disease, stage 1: Secondary | ICD-10-CM | POA: Diagnosis not present

## 2019-01-26 DIAGNOSIS — E1122 Type 2 diabetes mellitus with diabetic chronic kidney disease: Secondary | ICD-10-CM | POA: Diagnosis not present

## 2019-01-27 DIAGNOSIS — E1142 Type 2 diabetes mellitus with diabetic polyneuropathy: Secondary | ICD-10-CM | POA: Diagnosis not present

## 2019-01-27 DIAGNOSIS — Z4781 Encounter for orthopedic aftercare following surgical amputation: Secondary | ICD-10-CM | POA: Diagnosis not present

## 2019-01-27 DIAGNOSIS — E1122 Type 2 diabetes mellitus with diabetic chronic kidney disease: Secondary | ICD-10-CM | POA: Diagnosis not present

## 2019-01-27 DIAGNOSIS — E1165 Type 2 diabetes mellitus with hyperglycemia: Secondary | ICD-10-CM | POA: Diagnosis not present

## 2019-01-27 DIAGNOSIS — E1149 Type 2 diabetes mellitus with other diabetic neurological complication: Secondary | ICD-10-CM | POA: Diagnosis not present

## 2019-01-27 DIAGNOSIS — I2510 Atherosclerotic heart disease of native coronary artery without angina pectoris: Secondary | ICD-10-CM | POA: Diagnosis not present

## 2019-01-27 DIAGNOSIS — I129 Hypertensive chronic kidney disease with stage 1 through stage 4 chronic kidney disease, or unspecified chronic kidney disease: Secondary | ICD-10-CM | POA: Diagnosis not present

## 2019-01-27 DIAGNOSIS — N181 Chronic kidney disease, stage 1: Secondary | ICD-10-CM | POA: Diagnosis not present

## 2019-01-27 DIAGNOSIS — E1151 Type 2 diabetes mellitus with diabetic peripheral angiopathy without gangrene: Secondary | ICD-10-CM | POA: Diagnosis not present

## 2019-01-28 DIAGNOSIS — N181 Chronic kidney disease, stage 1: Secondary | ICD-10-CM | POA: Diagnosis not present

## 2019-01-28 DIAGNOSIS — I129 Hypertensive chronic kidney disease with stage 1 through stage 4 chronic kidney disease, or unspecified chronic kidney disease: Secondary | ICD-10-CM | POA: Diagnosis not present

## 2019-01-28 DIAGNOSIS — Z9119 Patient's noncompliance with other medical treatment and regimen: Secondary | ICD-10-CM | POA: Diagnosis not present

## 2019-01-28 DIAGNOSIS — Z4781 Encounter for orthopedic aftercare following surgical amputation: Secondary | ICD-10-CM | POA: Diagnosis not present

## 2019-01-28 DIAGNOSIS — E1151 Type 2 diabetes mellitus with diabetic peripheral angiopathy without gangrene: Secondary | ICD-10-CM | POA: Diagnosis not present

## 2019-01-28 DIAGNOSIS — Z6828 Body mass index (BMI) 28.0-28.9, adult: Secondary | ICD-10-CM | POA: Diagnosis not present

## 2019-01-28 DIAGNOSIS — Z89612 Acquired absence of left leg above knee: Secondary | ICD-10-CM | POA: Diagnosis not present

## 2019-01-28 DIAGNOSIS — E1122 Type 2 diabetes mellitus with diabetic chronic kidney disease: Secondary | ICD-10-CM | POA: Diagnosis not present

## 2019-01-28 DIAGNOSIS — E119 Type 2 diabetes mellitus without complications: Secondary | ICD-10-CM | POA: Diagnosis not present

## 2019-01-28 DIAGNOSIS — E1149 Type 2 diabetes mellitus with other diabetic neurological complication: Secondary | ICD-10-CM | POA: Diagnosis not present

## 2019-01-28 DIAGNOSIS — I2510 Atherosclerotic heart disease of native coronary artery without angina pectoris: Secondary | ICD-10-CM | POA: Diagnosis not present

## 2019-01-28 DIAGNOSIS — E1165 Type 2 diabetes mellitus with hyperglycemia: Secondary | ICD-10-CM | POA: Diagnosis not present

## 2019-01-28 DIAGNOSIS — E1142 Type 2 diabetes mellitus with diabetic polyneuropathy: Secondary | ICD-10-CM | POA: Diagnosis not present

## 2019-01-30 DIAGNOSIS — Z89512 Acquired absence of left leg below knee: Secondary | ICD-10-CM | POA: Diagnosis not present

## 2019-01-31 DIAGNOSIS — I129 Hypertensive chronic kidney disease with stage 1 through stage 4 chronic kidney disease, or unspecified chronic kidney disease: Secondary | ICD-10-CM | POA: Diagnosis not present

## 2019-01-31 DIAGNOSIS — E1142 Type 2 diabetes mellitus with diabetic polyneuropathy: Secondary | ICD-10-CM | POA: Diagnosis not present

## 2019-01-31 DIAGNOSIS — Z4781 Encounter for orthopedic aftercare following surgical amputation: Secondary | ICD-10-CM | POA: Diagnosis not present

## 2019-01-31 DIAGNOSIS — I2510 Atherosclerotic heart disease of native coronary artery without angina pectoris: Secondary | ICD-10-CM | POA: Diagnosis not present

## 2019-01-31 DIAGNOSIS — E1165 Type 2 diabetes mellitus with hyperglycemia: Secondary | ICD-10-CM | POA: Diagnosis not present

## 2019-01-31 DIAGNOSIS — E1149 Type 2 diabetes mellitus with other diabetic neurological complication: Secondary | ICD-10-CM | POA: Diagnosis not present

## 2019-01-31 DIAGNOSIS — E1122 Type 2 diabetes mellitus with diabetic chronic kidney disease: Secondary | ICD-10-CM | POA: Diagnosis not present

## 2019-01-31 DIAGNOSIS — E1151 Type 2 diabetes mellitus with diabetic peripheral angiopathy without gangrene: Secondary | ICD-10-CM | POA: Diagnosis not present

## 2019-01-31 DIAGNOSIS — N181 Chronic kidney disease, stage 1: Secondary | ICD-10-CM | POA: Diagnosis not present

## 2019-02-01 DIAGNOSIS — E1151 Type 2 diabetes mellitus with diabetic peripheral angiopathy without gangrene: Secondary | ICD-10-CM | POA: Diagnosis not present

## 2019-02-01 DIAGNOSIS — Z4781 Encounter for orthopedic aftercare following surgical amputation: Secondary | ICD-10-CM | POA: Diagnosis not present

## 2019-02-01 DIAGNOSIS — N181 Chronic kidney disease, stage 1: Secondary | ICD-10-CM | POA: Diagnosis not present

## 2019-02-01 DIAGNOSIS — E1122 Type 2 diabetes mellitus with diabetic chronic kidney disease: Secondary | ICD-10-CM | POA: Diagnosis not present

## 2019-02-01 DIAGNOSIS — E1149 Type 2 diabetes mellitus with other diabetic neurological complication: Secondary | ICD-10-CM | POA: Diagnosis not present

## 2019-02-01 DIAGNOSIS — I2510 Atherosclerotic heart disease of native coronary artery without angina pectoris: Secondary | ICD-10-CM | POA: Diagnosis not present

## 2019-02-01 DIAGNOSIS — I129 Hypertensive chronic kidney disease with stage 1 through stage 4 chronic kidney disease, or unspecified chronic kidney disease: Secondary | ICD-10-CM | POA: Diagnosis not present

## 2019-02-01 DIAGNOSIS — E1142 Type 2 diabetes mellitus with diabetic polyneuropathy: Secondary | ICD-10-CM | POA: Diagnosis not present

## 2019-02-01 DIAGNOSIS — E1165 Type 2 diabetes mellitus with hyperglycemia: Secondary | ICD-10-CM | POA: Diagnosis not present

## 2019-02-04 DIAGNOSIS — E1165 Type 2 diabetes mellitus with hyperglycemia: Secondary | ICD-10-CM | POA: Diagnosis not present

## 2019-02-04 DIAGNOSIS — I129 Hypertensive chronic kidney disease with stage 1 through stage 4 chronic kidney disease, or unspecified chronic kidney disease: Secondary | ICD-10-CM | POA: Diagnosis not present

## 2019-02-04 DIAGNOSIS — E1151 Type 2 diabetes mellitus with diabetic peripheral angiopathy without gangrene: Secondary | ICD-10-CM | POA: Diagnosis not present

## 2019-02-04 DIAGNOSIS — N181 Chronic kidney disease, stage 1: Secondary | ICD-10-CM | POA: Diagnosis not present

## 2019-02-04 DIAGNOSIS — E1149 Type 2 diabetes mellitus with other diabetic neurological complication: Secondary | ICD-10-CM | POA: Diagnosis not present

## 2019-02-04 DIAGNOSIS — E1122 Type 2 diabetes mellitus with diabetic chronic kidney disease: Secondary | ICD-10-CM | POA: Diagnosis not present

## 2019-02-04 DIAGNOSIS — I2510 Atherosclerotic heart disease of native coronary artery without angina pectoris: Secondary | ICD-10-CM | POA: Diagnosis not present

## 2019-02-04 DIAGNOSIS — E1142 Type 2 diabetes mellitus with diabetic polyneuropathy: Secondary | ICD-10-CM | POA: Diagnosis not present

## 2019-02-04 DIAGNOSIS — Z4781 Encounter for orthopedic aftercare following surgical amputation: Secondary | ICD-10-CM | POA: Diagnosis not present

## 2019-02-07 DIAGNOSIS — E1149 Type 2 diabetes mellitus with other diabetic neurological complication: Secondary | ICD-10-CM | POA: Diagnosis not present

## 2019-02-07 DIAGNOSIS — Z4781 Encounter for orthopedic aftercare following surgical amputation: Secondary | ICD-10-CM | POA: Diagnosis not present

## 2019-02-07 DIAGNOSIS — N181 Chronic kidney disease, stage 1: Secondary | ICD-10-CM | POA: Diagnosis not present

## 2019-02-07 DIAGNOSIS — E1151 Type 2 diabetes mellitus with diabetic peripheral angiopathy without gangrene: Secondary | ICD-10-CM | POA: Diagnosis not present

## 2019-02-07 DIAGNOSIS — I129 Hypertensive chronic kidney disease with stage 1 through stage 4 chronic kidney disease, or unspecified chronic kidney disease: Secondary | ICD-10-CM | POA: Diagnosis not present

## 2019-02-07 DIAGNOSIS — E1165 Type 2 diabetes mellitus with hyperglycemia: Secondary | ICD-10-CM | POA: Diagnosis not present

## 2019-02-07 DIAGNOSIS — I2510 Atherosclerotic heart disease of native coronary artery without angina pectoris: Secondary | ICD-10-CM | POA: Diagnosis not present

## 2019-02-07 DIAGNOSIS — E1142 Type 2 diabetes mellitus with diabetic polyneuropathy: Secondary | ICD-10-CM | POA: Diagnosis not present

## 2019-02-07 DIAGNOSIS — E1122 Type 2 diabetes mellitus with diabetic chronic kidney disease: Secondary | ICD-10-CM | POA: Diagnosis not present

## 2019-02-08 DIAGNOSIS — Z4781 Encounter for orthopedic aftercare following surgical amputation: Secondary | ICD-10-CM | POA: Diagnosis not present

## 2019-02-08 DIAGNOSIS — E1122 Type 2 diabetes mellitus with diabetic chronic kidney disease: Secondary | ICD-10-CM | POA: Diagnosis not present

## 2019-02-08 DIAGNOSIS — E1149 Type 2 diabetes mellitus with other diabetic neurological complication: Secondary | ICD-10-CM | POA: Diagnosis not present

## 2019-02-08 DIAGNOSIS — E1165 Type 2 diabetes mellitus with hyperglycemia: Secondary | ICD-10-CM | POA: Diagnosis not present

## 2019-02-08 DIAGNOSIS — N181 Chronic kidney disease, stage 1: Secondary | ICD-10-CM | POA: Diagnosis not present

## 2019-02-08 DIAGNOSIS — I129 Hypertensive chronic kidney disease with stage 1 through stage 4 chronic kidney disease, or unspecified chronic kidney disease: Secondary | ICD-10-CM | POA: Diagnosis not present

## 2019-02-08 DIAGNOSIS — E1142 Type 2 diabetes mellitus with diabetic polyneuropathy: Secondary | ICD-10-CM | POA: Diagnosis not present

## 2019-02-08 DIAGNOSIS — E1151 Type 2 diabetes mellitus with diabetic peripheral angiopathy without gangrene: Secondary | ICD-10-CM | POA: Diagnosis not present

## 2019-02-08 DIAGNOSIS — I2510 Atherosclerotic heart disease of native coronary artery without angina pectoris: Secondary | ICD-10-CM | POA: Diagnosis not present

## 2019-02-11 DIAGNOSIS — E1122 Type 2 diabetes mellitus with diabetic chronic kidney disease: Secondary | ICD-10-CM | POA: Diagnosis not present

## 2019-02-11 DIAGNOSIS — E1165 Type 2 diabetes mellitus with hyperglycemia: Secondary | ICD-10-CM | POA: Diagnosis not present

## 2019-02-11 DIAGNOSIS — N181 Chronic kidney disease, stage 1: Secondary | ICD-10-CM | POA: Diagnosis not present

## 2019-02-11 DIAGNOSIS — E1142 Type 2 diabetes mellitus with diabetic polyneuropathy: Secondary | ICD-10-CM | POA: Diagnosis not present

## 2019-02-11 DIAGNOSIS — Z4781 Encounter for orthopedic aftercare following surgical amputation: Secondary | ICD-10-CM | POA: Diagnosis not present

## 2019-02-11 DIAGNOSIS — I2510 Atherosclerotic heart disease of native coronary artery without angina pectoris: Secondary | ICD-10-CM | POA: Diagnosis not present

## 2019-02-11 DIAGNOSIS — I129 Hypertensive chronic kidney disease with stage 1 through stage 4 chronic kidney disease, or unspecified chronic kidney disease: Secondary | ICD-10-CM | POA: Diagnosis not present

## 2019-02-11 DIAGNOSIS — E1149 Type 2 diabetes mellitus with other diabetic neurological complication: Secondary | ICD-10-CM | POA: Diagnosis not present

## 2019-02-11 DIAGNOSIS — E1151 Type 2 diabetes mellitus with diabetic peripheral angiopathy without gangrene: Secondary | ICD-10-CM | POA: Diagnosis not present

## 2019-02-13 DIAGNOSIS — Z89512 Acquired absence of left leg below knee: Secondary | ICD-10-CM | POA: Diagnosis not present

## 2019-02-14 DIAGNOSIS — E1122 Type 2 diabetes mellitus with diabetic chronic kidney disease: Secondary | ICD-10-CM | POA: Diagnosis not present

## 2019-02-14 DIAGNOSIS — I129 Hypertensive chronic kidney disease with stage 1 through stage 4 chronic kidney disease, or unspecified chronic kidney disease: Secondary | ICD-10-CM | POA: Diagnosis not present

## 2019-02-14 DIAGNOSIS — Z4781 Encounter for orthopedic aftercare following surgical amputation: Secondary | ICD-10-CM | POA: Diagnosis not present

## 2019-02-14 DIAGNOSIS — E1165 Type 2 diabetes mellitus with hyperglycemia: Secondary | ICD-10-CM | POA: Diagnosis not present

## 2019-02-14 DIAGNOSIS — E1142 Type 2 diabetes mellitus with diabetic polyneuropathy: Secondary | ICD-10-CM | POA: Diagnosis not present

## 2019-02-14 DIAGNOSIS — I2510 Atherosclerotic heart disease of native coronary artery without angina pectoris: Secondary | ICD-10-CM | POA: Diagnosis not present

## 2019-02-14 DIAGNOSIS — E1149 Type 2 diabetes mellitus with other diabetic neurological complication: Secondary | ICD-10-CM | POA: Diagnosis not present

## 2019-02-14 DIAGNOSIS — N181 Chronic kidney disease, stage 1: Secondary | ICD-10-CM | POA: Diagnosis not present

## 2019-02-14 DIAGNOSIS — E1151 Type 2 diabetes mellitus with diabetic peripheral angiopathy without gangrene: Secondary | ICD-10-CM | POA: Diagnosis not present

## 2019-02-15 DIAGNOSIS — E1151 Type 2 diabetes mellitus with diabetic peripheral angiopathy without gangrene: Secondary | ICD-10-CM | POA: Diagnosis not present

## 2019-02-15 DIAGNOSIS — Z4781 Encounter for orthopedic aftercare following surgical amputation: Secondary | ICD-10-CM | POA: Diagnosis not present

## 2019-02-15 DIAGNOSIS — E1142 Type 2 diabetes mellitus with diabetic polyneuropathy: Secondary | ICD-10-CM | POA: Diagnosis not present

## 2019-02-15 DIAGNOSIS — E1165 Type 2 diabetes mellitus with hyperglycemia: Secondary | ICD-10-CM | POA: Diagnosis not present

## 2019-02-15 DIAGNOSIS — E1149 Type 2 diabetes mellitus with other diabetic neurological complication: Secondary | ICD-10-CM | POA: Diagnosis not present

## 2019-02-15 DIAGNOSIS — E1122 Type 2 diabetes mellitus with diabetic chronic kidney disease: Secondary | ICD-10-CM | POA: Diagnosis not present

## 2019-02-15 DIAGNOSIS — I2510 Atherosclerotic heart disease of native coronary artery without angina pectoris: Secondary | ICD-10-CM | POA: Diagnosis not present

## 2019-02-15 DIAGNOSIS — I129 Hypertensive chronic kidney disease with stage 1 through stage 4 chronic kidney disease, or unspecified chronic kidney disease: Secondary | ICD-10-CM | POA: Diagnosis not present

## 2019-02-15 DIAGNOSIS — N181 Chronic kidney disease, stage 1: Secondary | ICD-10-CM | POA: Diagnosis not present

## 2019-02-17 DIAGNOSIS — E1122 Type 2 diabetes mellitus with diabetic chronic kidney disease: Secondary | ICD-10-CM | POA: Diagnosis not present

## 2019-02-17 DIAGNOSIS — I129 Hypertensive chronic kidney disease with stage 1 through stage 4 chronic kidney disease, or unspecified chronic kidney disease: Secondary | ICD-10-CM | POA: Diagnosis not present

## 2019-02-17 DIAGNOSIS — E1151 Type 2 diabetes mellitus with diabetic peripheral angiopathy without gangrene: Secondary | ICD-10-CM | POA: Diagnosis not present

## 2019-02-17 DIAGNOSIS — E1149 Type 2 diabetes mellitus with other diabetic neurological complication: Secondary | ICD-10-CM | POA: Diagnosis not present

## 2019-02-17 DIAGNOSIS — Z4781 Encounter for orthopedic aftercare following surgical amputation: Secondary | ICD-10-CM | POA: Diagnosis not present

## 2019-02-17 DIAGNOSIS — N181 Chronic kidney disease, stage 1: Secondary | ICD-10-CM | POA: Diagnosis not present

## 2019-02-17 DIAGNOSIS — I2510 Atherosclerotic heart disease of native coronary artery without angina pectoris: Secondary | ICD-10-CM | POA: Diagnosis not present

## 2019-02-17 DIAGNOSIS — E1165 Type 2 diabetes mellitus with hyperglycemia: Secondary | ICD-10-CM | POA: Diagnosis not present

## 2019-02-17 DIAGNOSIS — E1142 Type 2 diabetes mellitus with diabetic polyneuropathy: Secondary | ICD-10-CM | POA: Diagnosis not present

## 2019-02-18 ENCOUNTER — Ambulatory Visit (INDEPENDENT_AMBULATORY_CARE_PROVIDER_SITE_OTHER): Payer: Self-pay | Admitting: Vascular Surgery

## 2019-02-18 ENCOUNTER — Encounter: Payer: Self-pay | Admitting: Vascular Surgery

## 2019-02-18 ENCOUNTER — Other Ambulatory Visit: Payer: Self-pay

## 2019-02-18 VITALS — BP 152/88 | HR 100 | Temp 97.3°F | Resp 20 | Ht 66.0 in | Wt 135.0 lb

## 2019-02-18 DIAGNOSIS — Z89612 Acquired absence of left leg above knee: Secondary | ICD-10-CM

## 2019-02-18 NOTE — Progress Notes (Signed)
    Subjective:     Patient ID: Shawn [name], male   DOB: 21-Dec-1950, 68 y.o.   MRN: [id number]  HPI 56-year male follows up after conversion of left below to above-knee amputation for bleeding wound.  He has done well.  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  He continues on aspirin.  Also has a right below-knee amputation.  These were done for diabetic disease.  States he has never had stroke maybe had mild heart attack does not have history of aneurysmal disease.  Overall his wound is healing well.   Review of Systems No complaints today    Objective:   Physical Exam Vitals:   02/18/19 0939  BP: (!) 152/88  Pulse: 100  Resp: 20  Temp: (!) 97.3 F (36.3 C)  SpO2: 99%   aaox3 No carotid bruits S1, s2, rrr Abdomen is soft, no masses Left aka site healing well     Assessment:     68 year old male status post conversion left below-knee amputation to above-knee amputation for wound.  Does not appear to have any carotid or aneurysmal disease is never had lower extremity bypass and has bilateral amputations at this point.    Plan:     Jodell Cipro out today. [place] for prosthetic evaluation. Can f/u prn     Eliabeth Shoff C. Donzetta Matters, MD [facility] Office: [phone] Pager: [phone]

## 2019-02-21 DIAGNOSIS — N181 Chronic kidney disease, stage 1: Secondary | ICD-10-CM | POA: Diagnosis not present

## 2019-02-21 DIAGNOSIS — E1142 Type 2 diabetes mellitus with diabetic polyneuropathy: Secondary | ICD-10-CM | POA: Diagnosis not present

## 2019-02-21 DIAGNOSIS — I2510 Atherosclerotic heart disease of native coronary artery without angina pectoris: Secondary | ICD-10-CM | POA: Diagnosis not present

## 2019-02-21 DIAGNOSIS — I129 Hypertensive chronic kidney disease with stage 1 through stage 4 chronic kidney disease, or unspecified chronic kidney disease: Secondary | ICD-10-CM | POA: Diagnosis not present

## 2019-02-21 DIAGNOSIS — Z4781 Encounter for orthopedic aftercare following surgical amputation: Secondary | ICD-10-CM | POA: Diagnosis not present

## 2019-02-21 DIAGNOSIS — E1165 Type 2 diabetes mellitus with hyperglycemia: Secondary | ICD-10-CM | POA: Diagnosis not present

## 2019-02-21 DIAGNOSIS — E1151 Type 2 diabetes mellitus with diabetic peripheral angiopathy without gangrene: Secondary | ICD-10-CM | POA: Diagnosis not present

## 2019-02-21 DIAGNOSIS — E1122 Type 2 diabetes mellitus with diabetic chronic kidney disease: Secondary | ICD-10-CM | POA: Diagnosis not present

## 2019-02-21 DIAGNOSIS — E1149 Type 2 diabetes mellitus with other diabetic neurological complication: Secondary | ICD-10-CM | POA: Diagnosis not present

## 2019-02-22 DIAGNOSIS — E1165 Type 2 diabetes mellitus with hyperglycemia: Secondary | ICD-10-CM | POA: Diagnosis not present

## 2019-02-22 DIAGNOSIS — N181 Chronic kidney disease, stage 1: Secondary | ICD-10-CM | POA: Diagnosis not present

## 2019-02-22 DIAGNOSIS — I129 Hypertensive chronic kidney disease with stage 1 through stage 4 chronic kidney disease, or unspecified chronic kidney disease: Secondary | ICD-10-CM | POA: Diagnosis not present

## 2019-02-22 DIAGNOSIS — E1149 Type 2 diabetes mellitus with other diabetic neurological complication: Secondary | ICD-10-CM | POA: Diagnosis not present

## 2019-02-22 DIAGNOSIS — I2510 Atherosclerotic heart disease of native coronary artery without angina pectoris: Secondary | ICD-10-CM | POA: Diagnosis not present

## 2019-02-22 DIAGNOSIS — E1142 Type 2 diabetes mellitus with diabetic polyneuropathy: Secondary | ICD-10-CM | POA: Diagnosis not present

## 2019-02-22 DIAGNOSIS — E1122 Type 2 diabetes mellitus with diabetic chronic kidney disease: Secondary | ICD-10-CM | POA: Diagnosis not present

## 2019-02-22 DIAGNOSIS — E1151 Type 2 diabetes mellitus with diabetic peripheral angiopathy without gangrene: Secondary | ICD-10-CM | POA: Diagnosis not present

## 2019-02-22 DIAGNOSIS — Z4781 Encounter for orthopedic aftercare following surgical amputation: Secondary | ICD-10-CM | POA: Diagnosis not present

## 2019-02-25 DIAGNOSIS — E1165 Type 2 diabetes mellitus with hyperglycemia: Secondary | ICD-10-CM | POA: Diagnosis not present

## 2019-02-25 DIAGNOSIS — N181 Chronic kidney disease, stage 1: Secondary | ICD-10-CM | POA: Diagnosis not present

## 2019-02-25 DIAGNOSIS — E1149 Type 2 diabetes mellitus with other diabetic neurological complication: Secondary | ICD-10-CM | POA: Diagnosis not present

## 2019-02-25 DIAGNOSIS — I2510 Atherosclerotic heart disease of native coronary artery without angina pectoris: Secondary | ICD-10-CM | POA: Diagnosis not present

## 2019-02-25 DIAGNOSIS — E1122 Type 2 diabetes mellitus with diabetic chronic kidney disease: Secondary | ICD-10-CM | POA: Diagnosis not present

## 2019-02-25 DIAGNOSIS — Z4781 Encounter for orthopedic aftercare following surgical amputation: Secondary | ICD-10-CM | POA: Diagnosis not present

## 2019-02-25 DIAGNOSIS — E1142 Type 2 diabetes mellitus with diabetic polyneuropathy: Secondary | ICD-10-CM | POA: Diagnosis not present

## 2019-02-25 DIAGNOSIS — I129 Hypertensive chronic kidney disease with stage 1 through stage 4 chronic kidney disease, or unspecified chronic kidney disease: Secondary | ICD-10-CM | POA: Diagnosis not present

## 2019-02-25 DIAGNOSIS — E1151 Type 2 diabetes mellitus with diabetic peripheral angiopathy without gangrene: Secondary | ICD-10-CM | POA: Diagnosis not present

## 2019-02-28 DIAGNOSIS — Z89512 Acquired absence of left leg below knee: Secondary | ICD-10-CM | POA: Diagnosis not present

## 2019-03-15 DIAGNOSIS — E1140 Type 2 diabetes mellitus with diabetic neuropathy, unspecified: Secondary | ICD-10-CM | POA: Diagnosis not present

## 2019-03-15 DIAGNOSIS — E7800 Pure hypercholesterolemia, unspecified: Secondary | ICD-10-CM | POA: Diagnosis not present

## 2019-03-15 DIAGNOSIS — M79605 Pain in left leg: Secondary | ICD-10-CM | POA: Diagnosis not present

## 2019-03-15 DIAGNOSIS — I10 Essential (primary) hypertension: Secondary | ICD-10-CM | POA: Diagnosis not present

## 2019-03-15 DIAGNOSIS — Z89512 Acquired absence of left leg below knee: Secondary | ICD-10-CM | POA: Diagnosis not present

## 2019-03-16 DIAGNOSIS — Z89512 Acquired absence of left leg below knee: Secondary | ICD-10-CM | POA: Diagnosis not present

## 2019-03-31 DIAGNOSIS — Z89512 Acquired absence of left leg below knee: Secondary | ICD-10-CM | POA: Diagnosis not present

## 2019-04-15 DIAGNOSIS — Z89512 Acquired absence of left leg below knee: Secondary | ICD-10-CM | POA: Diagnosis not present

## 2019-04-30 DIAGNOSIS — Z89512 Acquired absence of left leg below knee: Secondary | ICD-10-CM | POA: Diagnosis not present

## 2019-05-16 DIAGNOSIS — Z89512 Acquired absence of left leg below knee: Secondary | ICD-10-CM | POA: Diagnosis not present

## 2019-05-18 DIAGNOSIS — E1165 Type 2 diabetes mellitus with hyperglycemia: Secondary | ICD-10-CM | POA: Diagnosis not present

## 2019-05-18 DIAGNOSIS — E118 Type 2 diabetes mellitus with unspecified complications: Secondary | ICD-10-CM | POA: Diagnosis not present

## 2019-05-18 DIAGNOSIS — Z6828 Body mass index (BMI) 28.0-28.9, adult: Secondary | ICD-10-CM | POA: Diagnosis not present

## 2019-05-18 DIAGNOSIS — Z125 Encounter for screening for malignant neoplasm of prostate: Secondary | ICD-10-CM | POA: Diagnosis not present

## 2019-05-18 DIAGNOSIS — I10 Essential (primary) hypertension: Secondary | ICD-10-CM | POA: Diagnosis not present

## 2019-05-18 DIAGNOSIS — L0201 Cutaneous abscess of face: Secondary | ICD-10-CM | POA: Diagnosis not present

## 2019-05-18 DIAGNOSIS — E7800 Pure hypercholesterolemia, unspecified: Secondary | ICD-10-CM | POA: Diagnosis not present

## 2019-05-31 DIAGNOSIS — Z89512 Acquired absence of left leg below knee: Secondary | ICD-10-CM | POA: Diagnosis not present

## 2019-06-12 DIAGNOSIS — E1122 Type 2 diabetes mellitus with diabetic chronic kidney disease: Secondary | ICD-10-CM | POA: Diagnosis not present

## 2019-06-12 DIAGNOSIS — E1149 Type 2 diabetes mellitus with other diabetic neurological complication: Secondary | ICD-10-CM | POA: Diagnosis not present

## 2019-06-12 DIAGNOSIS — I129 Hypertensive chronic kidney disease with stage 1 through stage 4 chronic kidney disease, or unspecified chronic kidney disease: Secondary | ICD-10-CM | POA: Diagnosis not present

## 2019-06-12 DIAGNOSIS — E1151 Type 2 diabetes mellitus with diabetic peripheral angiopathy without gangrene: Secondary | ICD-10-CM | POA: Diagnosis not present

## 2019-06-12 DIAGNOSIS — I2510 Atherosclerotic heart disease of native coronary artery without angina pectoris: Secondary | ICD-10-CM | POA: Diagnosis not present

## 2019-06-12 DIAGNOSIS — E1165 Type 2 diabetes mellitus with hyperglycemia: Secondary | ICD-10-CM | POA: Diagnosis not present

## 2019-06-12 DIAGNOSIS — N181 Chronic kidney disease, stage 1: Secondary | ICD-10-CM | POA: Diagnosis not present

## 2019-06-12 DIAGNOSIS — Z4781 Encounter for orthopedic aftercare following surgical amputation: Secondary | ICD-10-CM | POA: Diagnosis not present

## 2019-06-12 DIAGNOSIS — E1142 Type 2 diabetes mellitus with diabetic polyneuropathy: Secondary | ICD-10-CM | POA: Diagnosis not present

## 2019-06-14 DIAGNOSIS — E1149 Type 2 diabetes mellitus with other diabetic neurological complication: Secondary | ICD-10-CM | POA: Diagnosis not present

## 2019-06-14 DIAGNOSIS — E1122 Type 2 diabetes mellitus with diabetic chronic kidney disease: Secondary | ICD-10-CM | POA: Diagnosis not present

## 2019-06-14 DIAGNOSIS — I2510 Atherosclerotic heart disease of native coronary artery without angina pectoris: Secondary | ICD-10-CM | POA: Diagnosis not present

## 2019-06-14 DIAGNOSIS — I129 Hypertensive chronic kidney disease with stage 1 through stage 4 chronic kidney disease, or unspecified chronic kidney disease: Secondary | ICD-10-CM | POA: Diagnosis not present

## 2019-06-14 DIAGNOSIS — E1151 Type 2 diabetes mellitus with diabetic peripheral angiopathy without gangrene: Secondary | ICD-10-CM | POA: Diagnosis not present

## 2019-06-14 DIAGNOSIS — E1165 Type 2 diabetes mellitus with hyperglycemia: Secondary | ICD-10-CM | POA: Diagnosis not present

## 2019-06-14 DIAGNOSIS — E1142 Type 2 diabetes mellitus with diabetic polyneuropathy: Secondary | ICD-10-CM | POA: Diagnosis not present

## 2019-06-14 DIAGNOSIS — N181 Chronic kidney disease, stage 1: Secondary | ICD-10-CM | POA: Diagnosis not present

## 2019-06-14 DIAGNOSIS — Z4781 Encounter for orthopedic aftercare following surgical amputation: Secondary | ICD-10-CM | POA: Diagnosis not present

## 2019-06-15 DIAGNOSIS — Z89512 Acquired absence of left leg below knee: Secondary | ICD-10-CM | POA: Diagnosis not present

## 2019-06-21 DIAGNOSIS — I129 Hypertensive chronic kidney disease with stage 1 through stage 4 chronic kidney disease, or unspecified chronic kidney disease: Secondary | ICD-10-CM | POA: Diagnosis not present

## 2019-06-21 DIAGNOSIS — Z4781 Encounter for orthopedic aftercare following surgical amputation: Secondary | ICD-10-CM | POA: Diagnosis not present

## 2019-06-21 DIAGNOSIS — E1142 Type 2 diabetes mellitus with diabetic polyneuropathy: Secondary | ICD-10-CM | POA: Diagnosis not present

## 2019-06-21 DIAGNOSIS — N181 Chronic kidney disease, stage 1: Secondary | ICD-10-CM | POA: Diagnosis not present

## 2019-06-21 DIAGNOSIS — I2510 Atherosclerotic heart disease of native coronary artery without angina pectoris: Secondary | ICD-10-CM | POA: Diagnosis not present

## 2019-06-21 DIAGNOSIS — E1122 Type 2 diabetes mellitus with diabetic chronic kidney disease: Secondary | ICD-10-CM | POA: Diagnosis not present

## 2019-06-21 DIAGNOSIS — E1165 Type 2 diabetes mellitus with hyperglycemia: Secondary | ICD-10-CM | POA: Diagnosis not present

## 2019-06-21 DIAGNOSIS — E1151 Type 2 diabetes mellitus with diabetic peripheral angiopathy without gangrene: Secondary | ICD-10-CM | POA: Diagnosis not present

## 2019-06-21 DIAGNOSIS — E1149 Type 2 diabetes mellitus with other diabetic neurological complication: Secondary | ICD-10-CM | POA: Diagnosis not present

## 2019-06-28 DIAGNOSIS — N181 Chronic kidney disease, stage 1: Secondary | ICD-10-CM | POA: Diagnosis not present

## 2019-06-28 DIAGNOSIS — E1122 Type 2 diabetes mellitus with diabetic chronic kidney disease: Secondary | ICD-10-CM | POA: Diagnosis not present

## 2019-06-28 DIAGNOSIS — I2510 Atherosclerotic heart disease of native coronary artery without angina pectoris: Secondary | ICD-10-CM | POA: Diagnosis not present

## 2019-06-28 DIAGNOSIS — E1165 Type 2 diabetes mellitus with hyperglycemia: Secondary | ICD-10-CM | POA: Diagnosis not present

## 2019-06-28 DIAGNOSIS — E1149 Type 2 diabetes mellitus with other diabetic neurological complication: Secondary | ICD-10-CM | POA: Diagnosis not present

## 2019-06-28 DIAGNOSIS — E1151 Type 2 diabetes mellitus with diabetic peripheral angiopathy without gangrene: Secondary | ICD-10-CM | POA: Diagnosis not present

## 2019-06-28 DIAGNOSIS — I129 Hypertensive chronic kidney disease with stage 1 through stage 4 chronic kidney disease, or unspecified chronic kidney disease: Secondary | ICD-10-CM | POA: Diagnosis not present

## 2019-06-28 DIAGNOSIS — Z4781 Encounter for orthopedic aftercare following surgical amputation: Secondary | ICD-10-CM | POA: Diagnosis not present

## 2019-06-28 DIAGNOSIS — E1142 Type 2 diabetes mellitus with diabetic polyneuropathy: Secondary | ICD-10-CM | POA: Diagnosis not present

## 2019-06-30 DIAGNOSIS — Z89512 Acquired absence of left leg below knee: Secondary | ICD-10-CM | POA: Diagnosis not present

## 2019-07-05 DIAGNOSIS — I2510 Atherosclerotic heart disease of native coronary artery without angina pectoris: Secondary | ICD-10-CM | POA: Diagnosis not present

## 2019-07-05 DIAGNOSIS — E1165 Type 2 diabetes mellitus with hyperglycemia: Secondary | ICD-10-CM | POA: Diagnosis not present

## 2019-07-05 DIAGNOSIS — E1122 Type 2 diabetes mellitus with diabetic chronic kidney disease: Secondary | ICD-10-CM | POA: Diagnosis not present

## 2019-07-05 DIAGNOSIS — E1151 Type 2 diabetes mellitus with diabetic peripheral angiopathy without gangrene: Secondary | ICD-10-CM | POA: Diagnosis not present

## 2019-07-05 DIAGNOSIS — E1142 Type 2 diabetes mellitus with diabetic polyneuropathy: Secondary | ICD-10-CM | POA: Diagnosis not present

## 2019-07-05 DIAGNOSIS — E1149 Type 2 diabetes mellitus with other diabetic neurological complication: Secondary | ICD-10-CM | POA: Diagnosis not present

## 2019-07-05 DIAGNOSIS — I129 Hypertensive chronic kidney disease with stage 1 through stage 4 chronic kidney disease, or unspecified chronic kidney disease: Secondary | ICD-10-CM | POA: Diagnosis not present

## 2019-07-05 DIAGNOSIS — N181 Chronic kidney disease, stage 1: Secondary | ICD-10-CM | POA: Diagnosis not present

## 2019-07-05 DIAGNOSIS — Z4781 Encounter for orthopedic aftercare following surgical amputation: Secondary | ICD-10-CM | POA: Diagnosis not present

## 2019-07-16 DIAGNOSIS — Z89512 Acquired absence of left leg below knee: Secondary | ICD-10-CM | POA: Diagnosis not present

## 2019-07-18 ENCOUNTER — Other Ambulatory Visit: Payer: Self-pay

## 2019-07-18 NOTE — Patient Outreach (Signed)
[facility]) Care Management  07/18/2019  Shawn [name] 11-01-1951 [id number]   Incoming call from patient. He is able to verify HIPAA. Patient reports he lives in his apartment alone with minimal assistance.  He states that his mother and brother help him sometimes.  Discussed reason for referral. Patient admits to not taking medications as prescribed as he cannot see well.  He states he did have home health but now they have stopped.  He states he needs some assist with cleaning, bathing and cooking.  Discussed [facility] services. Patient is agreeable to services.     Conditions: patient is a right BKA and a Left AKA.  He states he has a prothesis for the right leg but did not have transportation to get fitted for a prothesis on the left yet.  He utilizes a scooter to get around and states he needs resources for a new scooter.  Patient is a diabetic and does not check his sugars regularly.  Last A1c was 11.2.  Advised patient on dangers of uncontrolled diabetes.  He verbalized understanding.  Patient also has HTN and Hyperlipidemia.    Medications: Patient unable to review medications at all due to vision problems.  Patient states he takes his medications but cannot see what he is taking due to vision issues.  He did mention that when his sugars are better he can see.  Advised patient on importance of taking medications as prescribed.    Advanced Directive: Patient does not have an advanced directive.    Plan: RN CM will contact patient next week for follow up. RN CM will refer to social worker for resources for transportation, care resources(possible PCS), and resources for a new scooter.   RN CM will refer to pharmacy for medication management and adherence.   RN CM will send barriers letter to physician and assessment.   RN CM will send welcome packet with consents to patient.    [name], RN, MSN [place] Management Care Management Coordinator Direct Line  [phone] Cell [phone] Toll Free: [phone]  Fax: [phone]

## 2019-07-18 NOTE — Patient Outreach (Addendum)
[facility]) Care Management  07/18/2019  ALDON HENGST [birth year] [id number]   Referral Date: 07/18/2019 Referral Source: MD referral Referral Reason: Needs medication assistance   Outreach Attempt: Telephone call to listed number reached patient's mother Gwenlyn Perking, who provides CM with patient phone number [phone] to call him.  She states she will also call him to let him know that CM will be calling.  Telephone call to patient.  No answer. HIPAA compliant voice message left.  Plan: RN CM will attempt patient again within 4 business days and send letter.     [name], RN, MSN [facility] Care Management Care Management Coordinator Direct Line [phone] Toll Free: [phone]  Fax: [phone]

## 2019-07-19 ENCOUNTER — Telehealth: Payer: Self-pay | Admitting: Pharmacist

## 2019-07-19 ENCOUNTER — Other Ambulatory Visit: Payer: Self-pay

## 2019-07-19 NOTE — Patient Outreach (Signed)
[facility]) Care Management  [place]   07/19/2019  LONELL STAMOS 25-Jul-1951 [id number]  Reason for referral: Medication Adherence  Referral source: [facility] RN Current insurance: [employer]  PMHx includes but not limited to:   PAD, type 2 diabetes, status post right lower extremity amputation.  Outreach:  Successful telephone call with patient.  HIPAA identifiers verified.   Subjective:  Patient is a 68 year old male with the above mentioned medical conditions. He also has sight issues and is not able to see his medication bottles. He had a nurse coming to his home but she stopped coming >2 weeks ago. He was referred for help with medication adherence.  Does the patient ever forget to take medication?  yes Does the patient have problems obtaining medications due to transportation?   no Does the patient have problems obtaining medications due to cost?  no  Does the patient feel that medications prescribed are effective?  yes Does the patient ever experience any side effects to the medications prescribed?  no  Does the patient measure his/her own blood glucose at home?  Yes Does the patient measure his/her own blood pressure at home? Yes   Objective: The ASCVD Risk score Mikey Bussing DC Jr., et al., 2013) failed to calculate for the following reasons:   The valid total cholesterol range is 130 to 320 mg/dL  Lab Results  Component Value Date   CREATININE 1.04 01/10/2019   CREATININE 0.98 01/09/2019   CREATININE 0.87 01/07/2019    No results found for: HGBA1C  Lipid Panel  No results found for: CHOL, TRIG, HDL, CHOLHDL, VLDL, LDLCALC, LDLDIRECT  BP Readings from Last 3 Encounters:  02/18/19 (!) 152/88  01/11/19 129/79  04/01/18 (!) 147/89    No Known Allergies  Medications Reviewed Today    Reviewed by Leota Jacobsen, LPN (Licensed Practical Nurse) on 02/18/19 at [phone]  Med List Status: <None>  Medication Order Taking? Sig Documenting Provider  Last Dose Status Informant  aspirin EC 81 MG tablet [id number] Yes Take 81 mg by mouth daily. [provider] Taking Active Care Giver  DULoxetine (CYMBALTA) 60 MG capsule [id number] Yes Take 60 mg by mouth daily. [provider] Taking Active Care Giver  gabapentin (NEURONTIN) 600 MG tablet [id number] Yes Take 600 mg by mouth 3 (three) times daily. [provider] Taking Active Care Giver  HYDROcodone-acetaminophen [facility]) 10-325 MG tablet [id number] Yes Take 1 tablet by mouth every 6 (six) hours as needed. Gabriel Earing, PA-C Taking Active   linaGLIPtin (TRADJENTA PO) [id number] Yes Take 5 mg by mouth daily. [provider] Taking Active Care Giver  lisinopril (PRINIVIL,ZESTRIL) 2.5 MG tablet [id number] Yes Take 2.5 mg by mouth daily. [provider] Taking Active Care Giver  metFORMIN (GLUCOPHAGE) 500 MG tablet [id number] Yes Take 1,000 mg by mouth 2 (two) times daily with a meal.  [provider] Taking Active Care Giver  Multiple Vitamin (MULTIVITAMIN WITH MINERALS) TABS tablet [id number] Yes Take 1 tablet by mouth daily. [provider] Taking Active Care Giver  naproxen sodium (ANAPROX) 550 MG tablet [id number] Yes Take 550 mg by mouth 2 (two) times daily with a meal. [provider] Taking Active Care Giver  Oxycodone HCl 10 MG TABS [id number] Yes Take 10 mg by mouth every 4 (four) hours as needed (pain). [provider] Taking Active Care Giver  pioglitazone (ACTOS) 15 MG tablet [id number] Yes Take 15 mg by mouth daily. [provider] Taking Active Care  Giver  pravastatin (PRAVACHOL) 40 MG tablet [id number] Yes Take 40 mg by mouth daily. [provider] Taking Active Care Giver  sitaGLIPtin (JANUVIA) 100 MG tablet [id number] Yes Take 100 mg by mouth daily. [provider] Taking Active Care Giver  thiamine 100 MG tablet [id number] Yes Take 100 mg by mouth daily. [provider] Taking  Active Care Giver          Assessment: Drugs sorted by system:  Neurologic/Psychologic: Duloxetine, Gabapentin,   Cardiovascular:  Aspirin, Lisinopril,  Endocrine: Metformin, Pioglitazone, Januvia  Pain: Naproxen Sodium,   Vitamins/Minerals/Supplements: Multiple Vitamin, Thiamine  Medication Review Findings:  . Adherence- patient does not see well. He had a nurse coming to his home to fill a pill box but she stopped coming >2 weeks ago.  Since she left, he has been trying to manage on his own. - A neighbor was over on the day I called and was able to read the patient's medications off to me via the phone. o She reported the patient had multiple bottles of the same medication and most of them were full. o Patient said the pharmacy "just keeps sending them".   Interventions: Called [facility]ose [facility] and enrolled patient. He can now pick up pre-filled boxes with individualized doses of his medications grouped by time of administration.     The packing system can not be started until 09/01/2019 because several medications cannot be filled per the patient's insurance until then.  [facility] as they were the home health providers for the patient. The [facility] representative said they closed the patient because his needs were met. They reported his mother and sister were going to manage his med planner.  The patient said his mother is not able to manage his medication box and his sister is working and is not able.  [facility] said they could go back to see the patient but they would need an order from the patient's PCP. The patient would also need to have been seen within 60 days of the order.  Called [facility] office and requested a new order for home health. Spoke with Maudie Mercury (the triage nurse).  Maudie Mercury said the patient had not been seen in the last 60 days. She was going to send the request over to the patient's  provider.  Patient was encouraged to ask his sister to assist with his pill box until we can get an order from the PCP for Home Health to come back out or get the pill packs from [facility].   Patient was also offered the "Talking Label" provided by [facility] for vision impaired patients but he did not want to switch pharmacies.  Medication Adherence Findings: Adherence Review  []  Excellent (no doses missed/week)     []  Good (no more than 1 dose missed/week)     []  Partial (2-3 doses missed/week) [x]  Poor (>3 doses missed/week)  Patient with poor understanding of regimen and poor understanding of indications.    Medication Assistance Findings:  No medication assistance needs identified  Extra Help:  Already receiving Full Extra Help Low Income Subsidy  Plan: . Will route note to PCP.  Marland Kitchen Will follow-up in 7-10 business days.   Elayne Guerin, PharmD, [place] Clinical Pharmacist [phone]

## 2019-07-19 NOTE — Patient Outreach (Signed)
[facility]) Care Management  07/19/2019  Shawn [name] 11-18-1951 [id number]  Social work referral received from [facility], [name], to assist patient with transportation, [place], and DME resources.  Unsuccessful outreach to patient today.  Left voicemail message and mailed unsuccessful outreach letter.  Will attempt to reach again within four business days.  Ronn Melena, BSW Social Worker [phone]

## 2019-07-20 ENCOUNTER — Other Ambulatory Visit: Payer: Self-pay

## 2019-07-20 NOTE — Patient Outreach (Signed)
[facility]) Care Management  07/20/2019  Shawn [name] [birth year] [id number]   Social work referral received from [facility], [name], to assist patient with transportation, [place], and DME resources.   Successful outreach to patient today.  BSW and patient discussed transportation services through [employer] and he was provided with contact information for scheduling. BSW also informed patient that he may be eligible for transportation benefits through [facility] and provided him with contact information to confirm eligibility. BSW and patient discussed personal care services.  Patient reports having Medicaid although policy information is not in Epic.  BSW inquired about what type of assistance patient needs.  Patient reports being independent with ADL's but is in need of someone to assist with housekeeping.  At this point in the conversation, patient requested that BSW speak with his brother.  BSW and brother further discussed personal care services.  BSW explained assessment process to determine eligibility for services and informed brother that these services are not solely for housekeeping needs.   He verbalized understanding and reiterated that patient is independent with ADL's.  PCS not being requested at this time. Brother reported that patient has motorized scooter that was purchased for patient by him and another family member but he is in need of a new one.  BSW encouraged him to contact [facility] regarding coverage/out-of-pocket expense for this DME.  BSW informed brother that an MD order will be necessary if patient decides to get DME. No other social work needs were identified so BSW is closing case at this time.   Shawn [name], BSW Social Worker [phone]

## 2019-07-26 ENCOUNTER — Other Ambulatory Visit: Payer: Self-pay

## 2019-07-26 NOTE — Patient Outreach (Signed)
[facility]) Care Management  07/26/2019  Shawn [name] November 27, 1951 [id number]   Telephone call to patient for check in. Patient able to verify HIPAA.  He states that he is doing ok and that he is taking his medication.  He states that the home health nurse came by to fill his pillbox.  He states he did have a doctors appointment later in the day but he called to cancel and reschedule t as it is going to rain and he does not want to get caught in the rain as he rides his electric scooter up the street to his appointments.  Patient states he has not checked his sugar as he cannot find his machine.  Asked patient to find his machine and check his sugars.  He verbally agreed to find his machine and have checked his sugars when CM calls next week.  Reiterated with patient the importance of checking his sugar and taking his medications as prescribed. He verbalized understanding.    Plan: RN CM will contact patient next week and patient agrees to next outreach.    [name], RN, MSN [place] Management Care Management Coordinator Direct Line [phone] Cell [phone] Toll Free: [phone]  Fax: [phone]

## 2019-07-31 DIAGNOSIS — Z89512 Acquired absence of left leg below knee: Secondary | ICD-10-CM | POA: Diagnosis not present

## 2019-08-01 ENCOUNTER — Other Ambulatory Visit: Payer: Self-pay | Admitting: Pharmacist

## 2019-08-01 NOTE — Patient Outreach (Addendum)
[facility]) Care Management  08/01/2019  JAYMIAN BOGART 02-22-1951 [id number]   Patient was called to follow up on medication adherence. HIPAA identifiers were obtained.  Patient was called to follow up on a Pill Pack Referral from earlier in the month.  Patient was set up with [facility]. Due to the way his prescriptions have been filled, his pill packs will not start until 09/01/2019.  Patient does not see well. He previously had home health services through [facility].  He reported having 1.5 weeks worth of medications in his pill pack (previously filled by home health).    When I spoke to the patient on 07/19/2019, he said he did not have medications in the pack and that he had been managing on his own since home health left.  Patient's PCP [name]) was called. A message was left about writing a new order for home health to manage the patient's pill box until he starts the [facility] Simple Dose program in September.  Patient has an appointment to be seen on Wednesday.  Plan: Call patient back after his PCP appointment.   Elayne Guerin, PharmD, [place] Clinical Pharmacist [phone]

## 2019-08-02 ENCOUNTER — Other Ambulatory Visit: Payer: Self-pay

## 2019-08-02 NOTE — Patient Outreach (Signed)
[facility]) Care Management  08/02/2019  KAMRYN GAUTHIER Jul 08, 1951 [id number]   Telephone call to patient for weekly check in.  Patient reports he is doing good.  He states he is taking his medications as prescribed.  Patient still has not found his meter but states he will see MD tomorrow and will request order for a new meter.  Stressed to patient importance of checking sugars and keeping them under control.  Patient states that his vision is not as blurry as before.  Discussed with patient how this could mean his sugars are under better control.  Also discussed with patient limiting sugary drinks, potatoes, bread, rice, and pastas.  He verbalized understanding.  Patient declines any needs or questions.    Plan: RN CM will attempt patient again next week and patient agrees to next outreach.    [name], RN, MSN [place] Management Care Management Coordinator Direct Line [phone] Cell [phone] Toll Free: [phone]  Fax: [phone]

## 2019-08-08 ENCOUNTER — Other Ambulatory Visit: Payer: Self-pay | Admitting: Pharmacist

## 2019-08-08 ENCOUNTER — Ambulatory Visit: Payer: Self-pay | Admitting: Pharmacist

## 2019-08-08 NOTE — Patient Outreach (Signed)
[facility]) Care Management  08/08/2019  Shawn [name] [birth year] [id number]   Patient was called today to follow up on medication adherence. He has been enrolled with [facility] Simple Dose program.  His pill packs are supposed to start 09/01/2019.  His PCP office as well as his home health agency have been consulted to help with his transition to pill packs.  Unfortunately, patient did not answer the phone today.  HIPAA compliant message was left on his voicemail.  Plan: Follow up with patient in 10-14 business days to help facilitate his pill packs.   Elayne Guerin, PharmD, [place] Clinical Pharmacist [phone]

## 2019-08-09 ENCOUNTER — Other Ambulatory Visit: Payer: Self-pay

## 2019-08-09 NOTE — Patient Outreach (Signed)
[facility]) Care Management  08/09/2019  NAHUEL ENCE 1951-10-10 [id number]   Telephone call to patient for follow up.  Patient reports that he is doing fine.  He did not make to the doctor last week due to the weather but patient states he has an appointment on Thursday.  Patient still has not found his old meter.  Patient states he will talk with physician about new meter on Thursday.  Stressed the importance of monitoring sugars and limiting carbohydrates.  He verbalized understanding.  Patient states he still has medication in his pill box to take while he is waiting on pill pack medications.  [facility] pharmacist working with patient on pill pack medications.  Patient denies any present needs or concerns.    Plan: RN CM will outreach patient next week and patient agreeable.    [name], RN, MSN [place] Management Care Management Coordinator Direct Line [phone] Cell [phone] Toll Free: [phone]  Fax: [phone]

## 2019-08-11 DIAGNOSIS — I10 Essential (primary) hypertension: Secondary | ICD-10-CM | POA: Diagnosis not present

## 2019-08-11 DIAGNOSIS — Z89512 Acquired absence of left leg below knee: Secondary | ICD-10-CM | POA: Diagnosis not present

## 2019-08-11 DIAGNOSIS — E118 Type 2 diabetes mellitus with unspecified complications: Secondary | ICD-10-CM | POA: Diagnosis not present

## 2019-08-11 DIAGNOSIS — Z6828 Body mass index (BMI) 28.0-28.9, adult: Secondary | ICD-10-CM | POA: Diagnosis not present

## 2019-08-11 DIAGNOSIS — E7800 Pure hypercholesterolemia, unspecified: Secondary | ICD-10-CM | POA: Diagnosis not present

## 2019-08-11 DIAGNOSIS — E1165 Type 2 diabetes mellitus with hyperglycemia: Secondary | ICD-10-CM | POA: Diagnosis not present

## 2019-08-11 DIAGNOSIS — Z89511 Acquired absence of right leg below knee: Secondary | ICD-10-CM | POA: Diagnosis not present

## 2019-08-16 ENCOUNTER — Other Ambulatory Visit: Payer: Self-pay

## 2019-08-16 DIAGNOSIS — Z89512 Acquired absence of left leg below knee: Secondary | ICD-10-CM | POA: Diagnosis not present

## 2019-08-16 NOTE — Patient Outreach (Signed)
[place] [facility]) Care Management  08/16/2019  Shawn [name] 06-Oct-1951 [id number]   Telephone call to patient for weekly call.  No answer.  HIPAA compliant voice message left.    Plan: RN CM will attempt patient again within 4 business days.  [name], RN, MSN [place] Management Care Management Coordinator Direct Line [phone] Cell [phone] Toll Free: [phone]  Fax: [phone]

## 2019-08-18 ENCOUNTER — Other Ambulatory Visit: Payer: Self-pay

## 2019-08-18 NOTE — Patient Outreach (Signed)
[place] [facility]) Care Management  08/18/2019  SOL BREYETTE 1951/05/19 [id number]   Telephone call to patient for follow up.  No answer.  HIPAA compliant voice message left.  Plan: RN CM will attempt again within 4 business days and send letter.  [name], RN, MSN [place] Management Care Management Coordinator Direct Line [phone] Cell [phone] Toll Free: [phone]  Fax: [phone]

## 2019-08-19 ENCOUNTER — Other Ambulatory Visit: Payer: Self-pay

## 2019-08-19 NOTE — Patient Outreach (Signed)
[place] [place] [facility]) Care Management  08/19/2019  Shawn [name] 04-Jun-1951 [id number]   Telephone call to patient for check in.  No answer.  HIPAA compliant voice message left.    Plan: RN CM will attempt patient again within 2 weeks.  [name], RN, MSN [place] Management Care Management Coordinator Direct Line [phone] Cell [phone] Toll Free: [phone]  Fax: [phone]

## 2019-08-29 ENCOUNTER — Ambulatory Visit: Payer: Self-pay | Admitting: Pharmacist

## 2019-08-30 ENCOUNTER — Other Ambulatory Visit: Payer: Self-pay

## 2019-08-30 NOTE — Patient Outreach (Signed)
[place] [facility]) Care Management  08/30/2019  Shawn [name] 05-07-1951 [id number]   Telephone call to patient for check in.  Patient reports he is doing good and that he saw his MD as scheduled.  Patient states that the MD said that his sugars were a little high but not bad. Patient states he does not remember the number.  Patient states that he found his meter but has no checked his sugar.  Advised patient to check his sugar today and again on Friday and record.  He is agreeable.  Discussed rationale for checking sugars with patient.   Advised that CM would call next week to check.  He is agreeable.  Patient states he still has medications in his pill box that he is taking.  Pharmacy currently working with patient to get him pill packs.  Patient denies any further needs at this time.    Plan: RN CM will contact patient next week and he is agreeable.    [name], RN, MSN [place] Management Care Management Coordinator Direct Line [phone] Cell [phone] Toll Free: [phone]  Fax: [phone]

## 2019-08-31 DIAGNOSIS — Z89512 Acquired absence of left leg below knee: Secondary | ICD-10-CM | POA: Diagnosis not present

## 2019-09-02 ENCOUNTER — Other Ambulatory Visit: Payer: Self-pay | Admitting: Pharmacist

## 2019-09-02 ENCOUNTER — Ambulatory Visit: Payer: Self-pay | Admitting: Pharmacist

## 2019-09-02 NOTE — Patient Outreach (Signed)
[facility]) Care Management  09/02/2019  Shawn [name] 15-Jul-1951 [id number]   Called patient to follow up on pill packs. Unfortunately, he did not answer the phone. HIPAA compliant message was left on his voicemail.    We have been working to get the patient set up with [facility] Simple [facility]ose (a pill packing system through [facility]).  Patient's packs are supposed to be delivered in the next few days-weeks.    [facility] was called to be sure everything was/is in order for delivery of the patient's pill packs.  The [facility] Technician stated the following medications are supposed to go into the patient's packing system for 09/09/2019 delivery:  Duloxetine 60 mg Gabapentin 600 mg Metformin 1000 mg Januvia 100mg  Naproxen 550mg  Pravastatin 40mg   Lisinopril 2.5mg  is on the patient's medication list but was not listed as one of the medications to be included in the pack. A 90 day supply was filled 06/03/2019  [name]. PA's office was called and a message was left about sending a new prescription for Lisinopril 2.5mg  to [facility].   [facility] will not release the packs until they speak with the the patient or his designated representative (his mother).  Patient's mother was called. Unfortunately, she did not answer the phone and did not have a voicemail set up.  Plan:  Call patient back on Monday to be sure he has spoken with some one at [facility]. If he has not, place a conference call. Follow up on Lisinopril prescription being sent from [facility] office.  Elayne Guerin, PharmD, [place] Clinical Pharmacist [phone]

## 2019-09-05 ENCOUNTER — Ambulatory Visit: Payer: Self-pay | Admitting: Pharmacist

## 2019-09-05 ENCOUNTER — Other Ambulatory Visit: Payer: Self-pay | Admitting: Pharmacist

## 2019-09-05 NOTE — Patient Outreach (Signed)
[place] [facility]) Care Management  09/05/2019  LUSTER STREETMAN 1951-07-25 [id number]  Patient was called to follow up on his upcoming Pill Pack Delivery. Unfortunately, he did not answer the phone. HIPAA compliant message was left on his mobile number voice mail.  We have been working to get the patient set up with [facility] Simple Dose (a pill packing system through [facility] pharmacy).  Patient's packs are supposed to be delivered in the next few days-weeks.    [facility] was called on 09/02/2019 to be sure everything was/is in order for delivery of the patient's pill packs.  The [facility] Technician stated the following medications are supposed to go into the patient's packing system for 09/09/2019 delivery:  Duloxetine 60 mg Gabapentin 600 mg Metformin 1000 mg Januvia 100mg  Naproxen 550mg  Pravastatin 40mg   Lisinopril 2.5mg  is on the patient's medication list but was not listed as one of the medications to be included in the pack. A 90 day supply was filled 06/03/2019  [name]. PA's office was also called on 09/02/2019 and a message was left about sending a new prescription for Lisinopril 2.5mg  to [facility].   Plan: Send patient an unsuccessful contact letter. Call patient back in 3-5 business days.   Elayne Guerin, PharmD, [place] Clinical Pharmacist [phone]

## 2019-09-06 ENCOUNTER — Other Ambulatory Visit: Payer: Self-pay

## 2019-09-06 NOTE — Patient Outreach (Signed)
[place] [facility]) Care Management  09/06/2019  JAMEIRE HOOTS [birth year] [id number]   Telephone call to patient for check in.  No answer.  HIPAA compliant voice message left.    Plan: RN CM will attempt patient again within 4 business days.    [name], RN, MSN [place] Management Care Management Coordinator Direct Line [phone] Cell [phone] Toll Free: [phone]  Fax: [phone]

## 2019-09-08 ENCOUNTER — Ambulatory Visit: Payer: Self-pay | Admitting: Pharmacist

## 2019-09-08 ENCOUNTER — Other Ambulatory Visit: Payer: Self-pay | Admitting: Pharmacist

## 2019-09-08 NOTE — Patient Outreach (Addendum)
[facility]) Care Management  09/08/2019  Shawn [name] 18-Feb-1951 [id number]   Patient was called to follow up on pill pack delivery from [facility]. Unfortuneately, he did not answer his phone. HIPAA compliant message was left on his voicemail.  Called patient's mother Shawn [name]) and left a HIPAA compliant message with her. Ms. Shawn [name] said she would give the patient the message.  Patient's pill packs were supposed to be delivered tomorrow. [facility] needed to speak with the patient or his mother to confirm delivery of the packs.  Plan: Await a call back from the patient. Call patient back in 3-5 business days.   Shawn [name], PharmD, [place] Clinical Pharmacist [phone]  ADDENDUM  Patient called me back. HIPAA identifiers were obtained. Patient reported that he [facility] He said someone came to his home and showed him how to use them and he did not have any questions.  Patient said he was not at home at the time of my call for [place] to review exactly what was in his pack.  Plan: Call patient back in 2 weeks to review.  Shawn [name], PharmD, [place] Clinical Pharmacist [phone]

## 2019-09-09 ENCOUNTER — Other Ambulatory Visit: Payer: Self-pay

## 2019-09-09 NOTE — Patient Outreach (Signed)
[facility]) Care Management  09/09/2019  Shawn [name] 03-Aug-1951 [id number]   Telephone call to patient for check in.  He states he is doing good.  He acknowledges talking with the pharmacist for his pill packs.  Patient states he got his pill packs and is taking his medications.  Patient still not checking his sugars. Encouraged patient to check his sugars and importance of limiting sugars and starches. He verbalized understanding.  Patient states that he got a call from his insurance about his wheelchair and was told that if he was getting a prothesis he did not qualify for a chair.  Patient states he is going to call back to inquire further about getting an Transport planner.   Patient denies any other concerns at this time.   Plan: RN CM will contact patient in the month of October and patient agreeable.    [name], RN, MSN [place] Management Care Management Coordinator Direct Line [phone] Cell [phone] Toll Free: [phone]  Fax: [phone]

## 2019-09-12 ENCOUNTER — Ambulatory Visit: Payer: Self-pay | Admitting: Pharmacist
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
## 2019-09-14 IMAGING — DX DG TIBIA/FIBULA PORT 2V*L*
1 series · 2 of 2 positions shown · non-contrast
Comparison: None.

CLINICAL DATA: Bleeding from amputation stump.

EXAM:
PORTABLE LEFT TIBIA AND FIBULA - 2 VIEW

[Series 1: leg · 0.14mm/px · 2 of 2 slices shown]
[im 1/2]
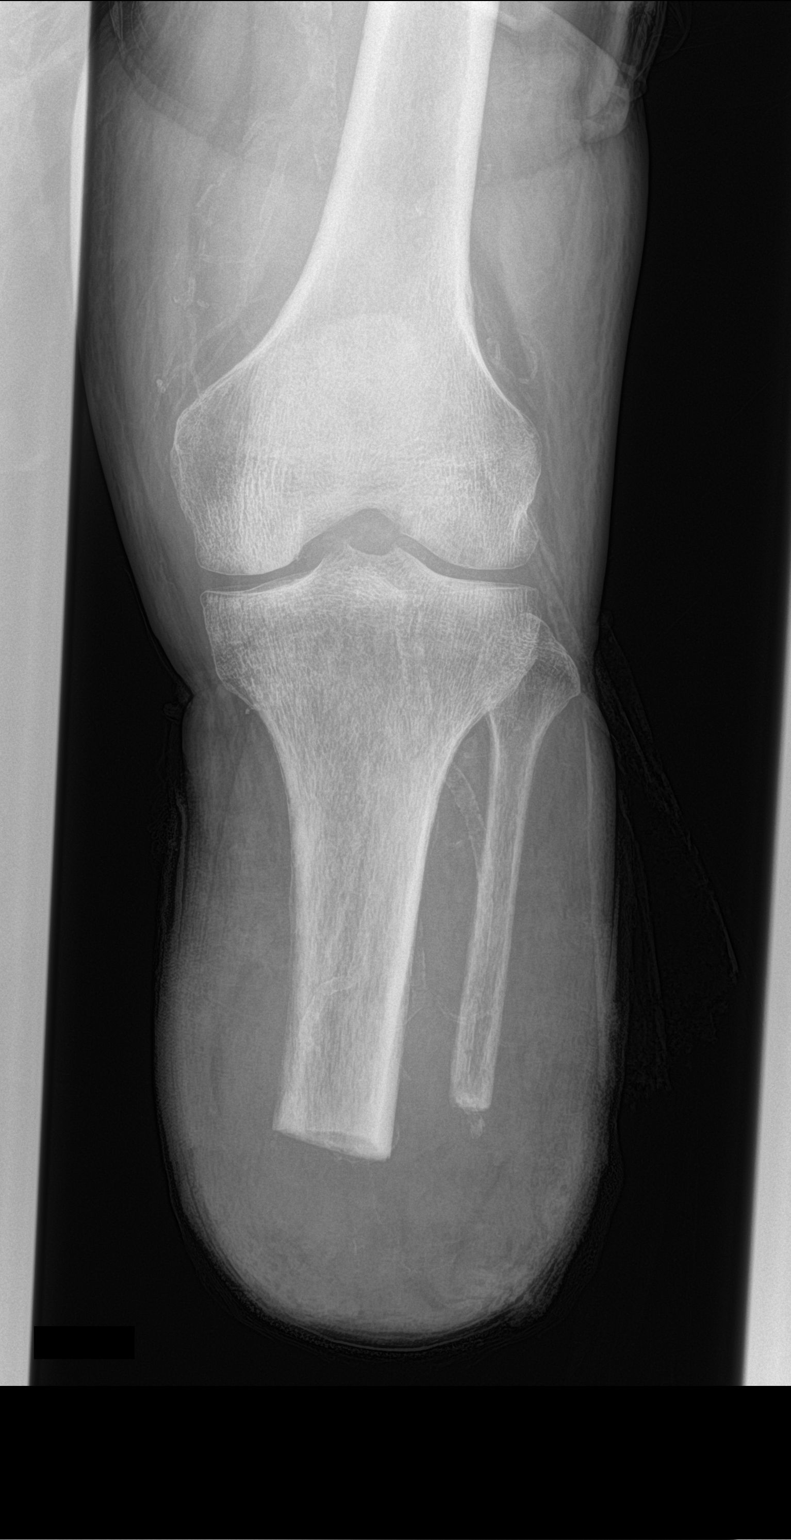
[im 2/2]
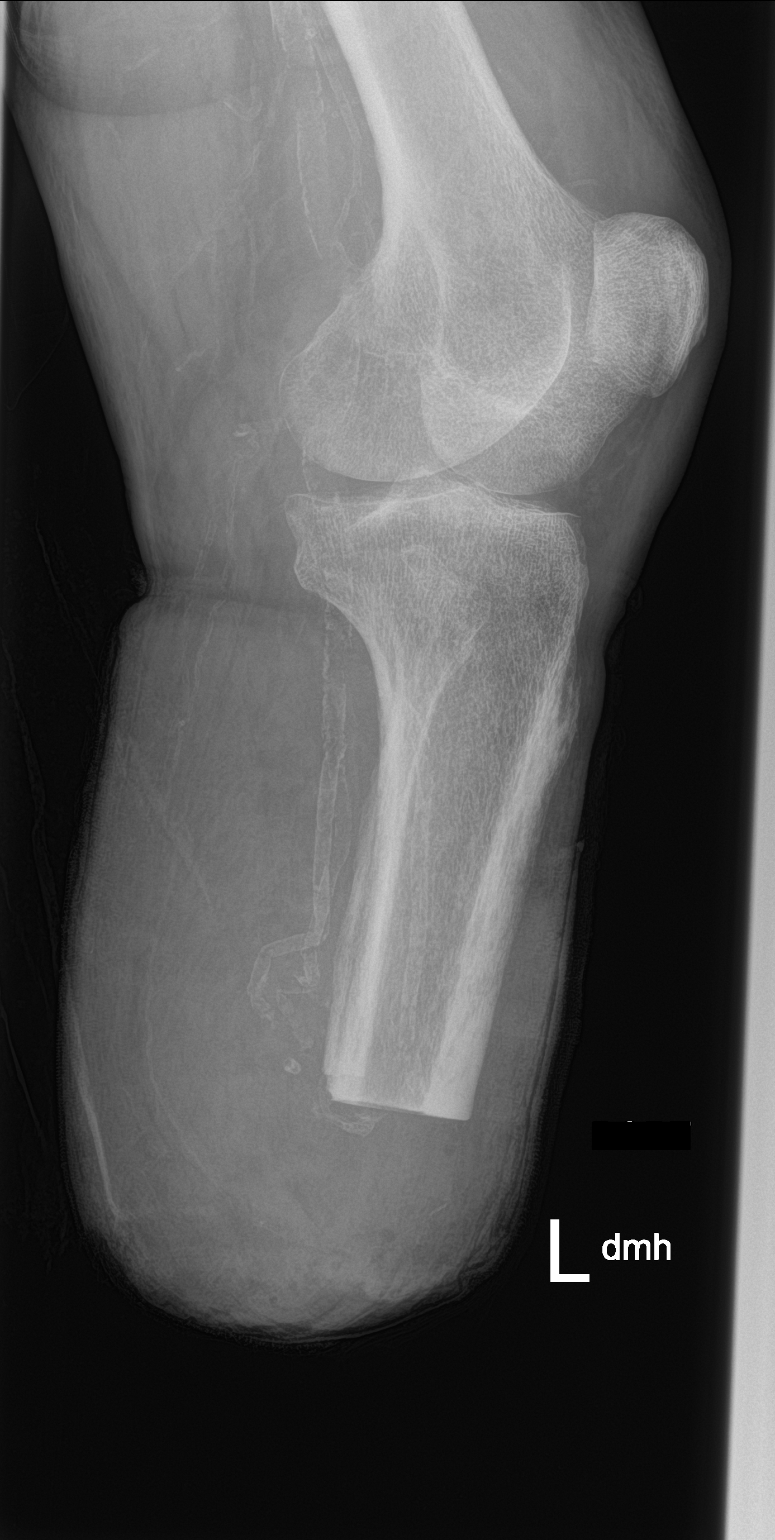

[2 of 2 positions shown; findings below may reference images not displayed]

FINDINGS: The patient is status post below-knee amputation. No focal soft
tissue ulceration or soft tissue emphysema is identified of the
stump. Nonspecific generalized mild soft tissue swelling may reflect
inflammation or cellulitis. No definite evidence of acute
osteomyelitis. The surgical margins of the tibia and fibula appear
intact without apparent bone destruction. Femoral through tibial
arteriosclerosis is identified. Moderate joint space narrowing is
identified of the knee.
IMPRESSION: Status post below-knee amputation. Mild soft tissue prominence of
the stomach may reflect cellulitis. No acute osseous abnormality.

## 2019-09-15 DIAGNOSIS — Z89512 Acquired absence of left leg below knee: Secondary | ICD-10-CM | POA: Diagnosis not present

## 2019-09-19 ENCOUNTER — Other Ambulatory Visit: Payer: Self-pay | Admitting: Pharmacist

## 2019-09-19 NOTE — Patient Outreach (Signed)
[facility]) Care Management  09/19/2019  Shawn [name] 07-Aug-1951 [id number]   Patient called and left a message stating there was an issue with his pill packs. Called patient back. HIPAA identifiers were obtained.  Patient said something had to be wrong with his pill packs because he had been checking his blood sugar and his blood sugars were in the 300s-400s. He felt like he had to be missing some medications for his blood sugars to be so high. Patient said the high blood sugars did not occur until he picked up his most recent pill pack.  From chart review, patient reported to Portal, [name] that he was not checking his blood sugar because he could not find his meter. He found his meter last week and started checking.  Patient was asked to read off what medications were in his packs but he said he could not read them.  [facility] to find out what medications were dispensed in his most recent pill pack. [facility] the following medications were in his pill pack:  Pravastatin, Metformin, Januvia, Gabapentin, Pioglitazone and Duloxetine  Aspirin and lisinopril were not in the September pack but are eligible for the October pack.  Aspirin requires a refill authorization from the patient's provider and [facility] said [facility] would [facility].  Patient still could not confirm what was in the pill packs he had in his possession.  He said he would call back when he had a friend or family member over.  Patient called back with his mother and brother Shirley Dudoit) on the phone. The patient's brother said he had two different pill packs. One pack was filled 08/09/19 and the other was filled 08/22/2019. [facility]2019.   Herbie Baltimore said the packs included: Gabapentin, Januvia, Naproxen and Pioglitazone only  When the patient was first set up to go on pill packs, several of his medications were too early to be filled so  they could not be put in the packs. This was explained to the patient originally and was attempted to be reinforced but it was very difficult to reach the patient. Several messages were left for the patient on his mobile number and with his mother and on her voicemail.  [facility] with the patient's brother on conference call. The technician confirmed the most recent pill packs were filled 09/11/2019 and were still at the local pharmacy awaiting pick up.  The patient's brother said the pill packs form 08/09/2019 were full. It is unclear what the patient has been taking.   His HgA1c from June was 11.2%.  As such, it is highly likely the patient's blood sugars have been in the 300s for quite some time.  Patient's brother said he would [facility] and [facility] the pill packs filled on 09/11/2019 and call me back but he did not.    Called patient back later in the afternoon but he did not answer. HIPAA compliant message was left on his voicemail.  Plan: Call patient back tomorrow.  Elayne Guerin, PharmD, [place] Clinical Pharmacist [phone]

## 2019-09-20 ENCOUNTER — Other Ambulatory Visit: Payer: Self-pay | Admitting: Pharmacist

## 2019-09-20 ENCOUNTER — Ambulatory Visit: Payer: Self-pay | Admitting: Pharmacist

## 2019-09-20 NOTE — Patient Outreach (Addendum)
[facility]) Care Management  09/20/2019  Shawn [name] August 12, 1951 [id number]   Called patient to follow up on pill packs. Unfortunately, he did not answer his phone. HIPAA compliant message was left on his voicemail and his mother's voicemail.  Plan; Await a call back. Call patient back next week.  Elayne Guerin, PharmD, [place] Clinical Pharmacist [phone]  Patient called me back. HIPAA identifiers were obtained. Patient said his brother picked up the most recent pill pack yesterday and that he now understands how to use them.  Spoke to the patient briefly about his HgA1c and glucose control.  Patient said he was not interested in beginning to use insulin at all. Unfortunately, with his HgA1c being above 11%, most oral therapies will not be powerful enough to provide the percentage reduction in A1c that he would need to reach his goal.  Patient is being followed by [facility] Nurse for diabetes management.   Plan: Follow up with th patient next week to check on his pill pack management.  Elayne Guerin, PharmD, [place] Clinical Pharmacist [phone]   Call patient back next week to check on him.

## 2019-09-21 ENCOUNTER — Other Ambulatory Visit: Payer: Self-pay

## 2019-09-21 NOTE — Patient Outreach (Signed)
[facility]) Care Management  [place]  09/21/2019   Shawn [name] 1951-11-19 [id number]  Subjective:  Telephone call to patient. He feels he is doing good.  He states that he is now checking his sugars daily and that it was coming down.  Patient states that sugars were about 500 but last reading last night was 300.  Advised patient that if he ever gets a sugar reading that high he needs to go to the hospital.  He verbalized understanding.  Discussed with patient when to check sugars, diet, importance of taking medications and avoiding alcohol.  He verbalized understanding.  Patient states he is taking his medications in his pill pack as ordered.  Encouraged him to continue.     Objective:   Encounter Medications:  Outpatient Encounter Medications as of 09/21/2019  Medication Sig  . aspirin EC 81 MG tablet Take 81 mg by mouth daily.  . DULoxetine (CYMBALTA) 60 MG capsule Take 60 mg by mouth daily.  Marland Kitchen gabapentin (NEURONTIN) 600 MG tablet Take 600 mg by mouth 3 (three) times daily.  Marland Kitchen lisinopril (PRINIVIL,ZESTRIL) 2.5 MG tablet Take 2.5 mg by mouth daily.  . metFORMIN (GLUCOPHAGE) 1000 MG tablet Take 1 tablet by mouth 2 (two) times daily.  . Multiple Vitamin (MULTIVITAMIN WITH MINERALS) TABS tablet Take 1 tablet by mouth daily.  . naproxen sodium (ANAPROX) 550 MG tablet Take 550 mg by mouth 2 (two) times daily with a meal.  . pioglitazone (ACTOS) 15 MG tablet Take 15 mg by mouth daily.  . pravastatin (PRAVACHOL) 40 MG tablet Take 40 mg by mouth daily.  . sitaGLIPtin (JANUVIA) 100 MG tablet Take 100 mg by mouth daily.  Marland Kitchen thiamine 100 MG tablet Take 100 mg by mouth daily.   No facility-administered encounter medications on file as of 09/21/2019.     Functional Status:  In your present state of health, do you have any difficulty performing the following activities: 07/18/2019 01/08/2019  Hearing? N N  Vision? N N  Difficulty concentrating or making decisions? N N   Walking or climbing stairs? Y Y  Comment bilateral amputee -  Dressing or bathing? Y N  Comment patient amputee and sponges off -  Doing errands, shopping? Y N  Comment patient relies on family -  Conservation officer, nature and eating ? N -  Using the Toilet? N -  In the past six months, have you accidently leaked urine? N -  Do you have problems with loss of bowel control? N -  Managing your Medications? Y -  Comment patient unable to see medications -  Managing your Finances? Y -  Comment brother handles bills -  Housekeeping or managing your Housekeeping? Y -  Comment patient needs assistance with cleaning -  Some recent data might be hidden    Fall/Depression Screening: Fall Risk  07/18/2019 02/25/2018 02/18/2018  Falls in the past year? 0 Yes Yes  Number falls in past yr: - 2 or more 2 or more  Injury with Fall? - Yes Yes   PHQ 2/9 Scores 07/18/2019 02/25/2018 02/18/2018  PHQ - 2 Score 0 0 0    Assessment:  Patient sugars had trended up but now going down.   Patient taking medication and checking sugars as advised.    Plan:  RN CM will continue to encourage patient to watch diet, check sugars regularly, and seek medical attention if sugars go up into 500 range.   RN CM will contact patient again in  the month of October and patient agreeable.   [name], RN, MSN [place] Management Care Management Coordinator Direct Line [phone] Cell [phone] Toll Free: [phone]  Fax: [phone]

## 2019-09-29 ENCOUNTER — Ambulatory Visit: Payer: Self-pay | Admitting: Pharmacist

## 2019-09-29 ENCOUNTER — Other Ambulatory Visit: Payer: Self-pay | Admitting: Pharmacist

## 2019-09-29 NOTE — Patient Outreach (Signed)
[place] [facility]) Care Management  09/29/2019  Shawn [name] 1951-03-31 [id number]   Called patient to follow up on medication adherence to pill packs. Unfortunately, he did not answer his phone. HIPAA compliant message was left on his voicemail.   Plan: Call patient back in 3-4 weeks to follow up on pill packing.  Elayne Guerin, PharmD, [place] Clinical Pharmacist [phone]

## 2019-09-30 DIAGNOSIS — Z89512 Acquired absence of left leg below knee: Secondary | ICD-10-CM | POA: Diagnosis not present

## 2019-10-11 DIAGNOSIS — Z89511 Acquired absence of right leg below knee: Secondary | ICD-10-CM | POA: Diagnosis not present

## 2019-10-11 DIAGNOSIS — H6691 Otitis media, unspecified, right ear: Secondary | ICD-10-CM | POA: Diagnosis not present

## 2019-10-11 DIAGNOSIS — E1142 Type 2 diabetes mellitus with diabetic polyneuropathy: Secondary | ICD-10-CM | POA: Diagnosis not present

## 2019-10-11 DIAGNOSIS — Z89612 Acquired absence of left leg above knee: Secondary | ICD-10-CM | POA: Diagnosis not present

## 2019-10-11 DIAGNOSIS — E7800 Pure hypercholesterolemia, unspecified: Secondary | ICD-10-CM | POA: Diagnosis not present

## 2019-10-11 DIAGNOSIS — Z89512 Acquired absence of left leg below knee: Secondary | ICD-10-CM | POA: Diagnosis not present

## 2019-10-11 DIAGNOSIS — Z6828 Body mass index (BMI) 28.0-28.9, adult: Secondary | ICD-10-CM | POA: Diagnosis not present

## 2019-10-11 DIAGNOSIS — I10 Essential (primary) hypertension: Secondary | ICD-10-CM | POA: Diagnosis not present

## 2019-10-12 ENCOUNTER — Other Ambulatory Visit: Payer: Self-pay

## 2019-10-12 NOTE — Patient Outreach (Signed)
[place] [facility]) Care Management  [place]  10/12/2019   Shawn [name] 1951-01-11 [id number]  Subjective: Telephone call to patient for follow up.  Patient states he saw his doctor on yesterday who told him that his sugars were coming down. Patient unsure of A1c but reports that sugars are coming down and that his last sugar was 176.  Congratulated patient on progress and encouraged patient to continue to watch his sugars and limit carbohydrates and sweets.  He verbalized understanding and denies any needs.    Objective:   Encounter Medications:  Outpatient Encounter Medications as of 10/12/2019  Medication Sig  . aspirin EC 81 MG tablet Take 81 mg by mouth daily.  . DULoxetine (CYMBALTA) 60 MG capsule Take 60 mg by mouth daily.  Marland Kitchen gabapentin (NEURONTIN) 600 MG tablet Take 600 mg by mouth 3 (three) times daily.  Marland Kitchen lisinopril (PRINIVIL,ZESTRIL) 2.5 MG tablet Take 2.5 mg by mouth daily.  . metFORMIN (GLUCOPHAGE) 1000 MG tablet Take 1 tablet by mouth 2 (two) times daily.  . Multiple Vitamin (MULTIVITAMIN WITH MINERALS) TABS tablet Take 1 tablet by mouth daily.  . naproxen sodium (ANAPROX) 550 MG tablet Take 550 mg by mouth 2 (two) times daily with a meal.  . pioglitazone (ACTOS) 15 MG tablet Take 15 mg by mouth daily.  . pravastatin (PRAVACHOL) 40 MG tablet Take 40 mg by mouth daily.  . sitaGLIPtin (JANUVIA) 100 MG tablet Take 100 mg by mouth daily.  Marland Kitchen thiamine 100 MG tablet Take 100 mg by mouth daily.   No facility-administered encounter medications on file as of 10/12/2019.     Functional Status:  In your present state of health, do you have any difficulty performing the following activities: 07/18/2019 01/08/2019  Hearing? N N  Vision? N N  Difficulty concentrating or making decisions? N N  Walking or climbing stairs? Y Y  Comment bilateral amputee -  Dressing or bathing? Y N  Comment patient amputee and sponges off -  Doing errands, shopping? Y N  Comment  patient relies on family -  Conservation officer, nature and eating ? N -  Using the Toilet? N -  In the past six months, have you accidently leaked urine? N -  Do you have problems with loss of bowel control? N -  Managing your Medications? Y -  Comment patient unable to see medications -  Managing your Finances? Y -  Comment brother handles bills -  Housekeeping or managing your Housekeeping? Y -  Comment patient needs assistance with cleaning -  Some recent data might be hidden    Fall/Depression Screening: Fall Risk  07/18/2019 02/25/2018 02/18/2018  Falls in the past year? 0 Yes Yes  Number falls in past yr: - 2 or more 2 or more  Injury with Fall? - Yes Yes   PHQ 2/9 Scores 07/18/2019 02/25/2018 02/18/2018  PHQ - 2 Score 0 0 0    Assessment: Patient continuing to manage diabetes.  Plan:  [facility] CM Care Plan Problem One     Most Recent Value  Care Plan Problem One  Uncontrolled diabetes  Role Documenting the Problem One  Care Management Telephonic Coordinator  Care Plan for Problem One  Active  THN Long Term Goal   Patient will keep sugars below 200 within 31 days  THN Long Term Goal Start Date  09/21/19  Interventions for Problem One Long Term Goal  RN CM discussed lastest MD appointment with patient.  Patient reports that he  is taking medications and checking sugars.  Discussed diet adherence importance to control sugars.    THN CM Short Term Goal #1   Patient will report checking sugars at least 2 /week within the next 21 days  THN CM Short Term Goal #1 Start Date  09/21/19  [facility] CM Short Term Goal #1 Met Date  10/12/19     RN CM will contact patient again in the month of November and patient agreeable.   [name], RN, MSN [place] Management Care Management Coordinator Direct Line [phone] Cell [phone] Toll Free: [phone]  Fax: [phone]

## 2019-10-16 DIAGNOSIS — Z89512 Acquired absence of left leg below knee: Secondary | ICD-10-CM | POA: Diagnosis not present

## 2019-10-20 ENCOUNTER — Other Ambulatory Visit: Payer: Self-pay | Admitting: Pharmacist

## 2019-10-20 ENCOUNTER — Ambulatory Visit: Payer: Self-pay | Admitting: Pharmacist

## 2019-10-20 NOTE — Patient Outreach (Signed)
[place] [facility]) Care Management  10/20/2019  SCOEY MONTY [birth year] [id number]  Patient was called to follow up on pill packs. Unfortunately, he did not answer the phone. HIPAA compliant message was left on his voicemail.  Plan: Call patient back in 1-2 weeks.  Elayne Guerin, PharmD, [place] Clinical Pharmacist [phone]

## 2019-10-31 DIAGNOSIS — Z89512 Acquired absence of left leg below knee: Secondary | ICD-10-CM | POA: Diagnosis not present

## 2019-11-03 ENCOUNTER — Ambulatory Visit: Payer: Self-pay | Admitting: Pharmacist

## 2019-11-03 ENCOUNTER — Other Ambulatory Visit: Payer: Self-pay | Admitting: Pharmacist

## 2019-11-03 NOTE — Patient Outreach (Signed)
[place] [facility]) Care Management  11/03/2019  TIKO HASSELMAN [birth year] [id number]   Patient was called to follow up on pill packs. HIPAA identifiers were obtained.   Patient is a 68 year old male with multiple medical conditions including but not limited to: history of right leg amputation, type diabetes, and PAD.  Patient also reported vision problems.  He was assisted with receiving his medications [facility].  Patient's medications were reviewed via telephone with his brother and the actual pill packs:  Medications Reviewed Today    Reviewed by Elayne Guerin, [facility] (Pharmacist) on 11/03/19 at 1205  Med List Status: <None>  Medication Order Taking? Sig Documenting Provider Last Dose Status Informant  aspirin EC 81 MG tablet [id number] Yes Take 81 mg by mouth daily. [provider] Taking Active Care Giver  DULoxetine (CYMBALTA) 60 MG capsule [id number] Yes Take 60 mg by mouth daily. [provider] Taking Active Care Giver  gabapentin (NEURONTIN) 600 MG tablet [id number] Yes Take 600 mg by mouth 3 (three) times daily. [provider] Taking Active Care Giver  lisinopril (PRINIVIL,ZESTRIL) 2.5 MG tablet [id number] Yes Take 2.5 mg by mouth daily. [provider] Taking Active Care Giver  metFORMIN (GLUCOPHAGE) 1000 MG tablet [id number] Yes Take 1 tablet by mouth 2 (two) times daily. [provider] Taking Active   Multiple Vitamin (MULTIVITAMIN WITH MINERALS) TABS tablet [id number] No Take 1 tablet by mouth daily. [provider] Not Taking Active Care Giver  naproxen sodium (ANAPROX) 550 MG tablet [id number] Yes Take 550 mg by mouth 2 (two) times daily with a meal. [provider] Taking Active Care Giver  pioglitazone (ACTOS) 15 MG tablet [id number] Yes Take 15 mg by mouth daily. [provider] Taking Active Care Giver  pravastatin (PRAVACHOL) 40 MG tablet [id number] Yes Take 40  mg by mouth daily. [provider] Taking Active Care Giver  sitaGLIPtin (JANUVIA) 100 MG tablet [id number] Yes Take 100 mg by mouth daily. [provider] Taking Active Care Giver  thiamine 100 MG tablet [id number] No Take 100 mg by mouth daily. [provider] Not Taking Active Care Giver         Findings: Adherence-although getting better, patient is still confused about his regimen. His brother reported several pill packs full of medications. A great deal of time was spent reviewing the pill packs and requesting that the patient only use the most recent pill packs (dated 10/11/2019).    Patient's HgA1c has decreased form 11% to 9.6%.  Plan: Call patient back in 3-4 weeks.  Elayne Guerin, PharmD, [place] Clinical Pharmacist [phone]

## 2019-11-09 ENCOUNTER — Other Ambulatory Visit: Payer: Self-pay

## 2019-11-09 NOTE — Patient Outreach (Signed)
[facility]) Care Management  [place]  11/09/2019   HONOR BERTINO 09-02-1951 [id number]  Subjective: Telephone call to patient for monthly check in.  Patient reports he is doing good.  He reports he got his new chair.  Advised patient on taking care of his chair.  He verbalized understanding.  Patient reports that his sugars have been no higher than 150 and last reading was 129.  Patient reports he is taking his medications as prescribed and still getting pill packs.  Discussed with patient importance of diet, checking sugars, and taking medications as prescribed.  He verbalized understanding and denies any needs.    Objective:   Encounter Medications:  Outpatient Encounter Medications as of 11/09/2019  Medication Sig  . aspirin EC 81 MG tablet Take 81 mg by mouth daily.  . DULoxetine (CYMBALTA) 60 MG capsule Take 60 mg by mouth daily.  Marland Kitchen gabapentin (NEURONTIN) 600 MG tablet Take 600 mg by mouth 3 (three) times daily.  Marland Kitchen lisinopril (PRINIVIL,ZESTRIL) 2.5 MG tablet Take 2.5 mg by mouth daily.  . metFORMIN (GLUCOPHAGE) 1000 MG tablet Take 1 tablet by mouth 2 (two) times daily.  . Multiple Vitamin (MULTIVITAMIN WITH MINERALS) TABS tablet Take 1 tablet by mouth daily.  . naproxen sodium (ANAPROX) 550 MG tablet Take 550 mg by mouth 2 (two) times daily with a meal.  . pioglitazone (ACTOS) 15 MG tablet Take 15 mg by mouth daily.  . pravastatin (PRAVACHOL) 40 MG tablet Take 40 mg by mouth daily.  . sitaGLIPtin (JANUVIA) 100 MG tablet Take 100 mg by mouth daily.  Marland Kitchen thiamine 100 MG tablet Take 100 mg by mouth daily.   No facility-administered encounter medications on file as of 11/09/2019.     Functional Status:  In your present state of health, do you have any difficulty performing the following activities: 07/18/2019 01/08/2019  Hearing? N N  Vision? N N  Difficulty concentrating or making decisions? N N  Walking or climbing stairs? Y Y  Comment bilateral amputee  -  Dressing or bathing? Y N  Comment patient amputee and sponges off -  Doing errands, shopping? Y N  Comment patient relies on family -  Conservation officer, nature and eating ? N -  Using the Toilet? N -  In the past six months, have you accidently leaked urine? N -  Do you have problems with loss of bowel control? N -  Managing your Medications? Y -  Comment patient unable to see medications -  Managing your Finances? Y -  Comment brother handles bills -  Housekeeping or managing your Housekeeping? Y -  Comment patient needs assistance with cleaning -  Some recent data might be hidden    Fall/Depression Screening: Fall Risk  10/12/2019 07/18/2019 02/25/2018  Falls in the past year? 0 0 Yes  Number falls in past yr: - - 2 or more  Injury with Fall? - - Yes   PHQ 2/9 Scores 07/18/2019 02/25/2018 02/18/2018  PHQ - 2 Score 0 0 0    Assessment:  Patient continuing to manage chronic illnesses.  No recent hospitalizations.    Plan:  [facility] CM Care Plan Problem One     Most Recent Value  Care Plan Problem One  Uncontrolled diabetes  Role Documenting the Problem One  Care Management Telephonic Coordinator  Care Plan for Problem One  Active  THN Long Term Goal   Patient will keep sugars below 200 within 90 days  THN Long Term Goal  Start Date  09/21/19  Interventions for Problem One Long Term Goal  Patient continuing to check sugars regularly. Patient gave sugar ranges.  Discussed importance of diet, checking sugars, and taking medications as prescribed.      RN CM will send update to physician.  RN CM will contact patient next month and patient agreeable.   [name], RN, MSN [place] Management Care Management Coordinator Direct Line [phone] Cell [phone] Toll Free: [phone]  Fax: [phone]

## 2019-11-15 DIAGNOSIS — Z89512 Acquired absence of left leg below knee: Secondary | ICD-10-CM | POA: Diagnosis not present

## 2019-11-30 ENCOUNTER — Other Ambulatory Visit: Payer: Self-pay | Admitting: Pharmacist

## 2019-11-30 NOTE — Patient Outreach (Signed)
[facility] [facility]) Care Management  11/30/2019  Shawn [name] 20-Sep-1951 [id number]   Patient was called to follow up on pill packs. HIPAA identifiers were obtained. Patient said he received his pill packs and is taking everything as prescribed.    Patient is a 68 year old male with multiple medical conditions including but not limited to:  Type 2 diabetes with history of osteomyelitis, status post amputation of right lower extremity, and PAD.  Patient said he has not been checking his blood sugar because he has been asleep.  He said he has a meter in his possession and strips.  Thankfully, he has been receiving his pill packs as requested. The last packs were delivered 11/03/2019 and patient said he is due to receive his next pack soon.  HgA1c 9.6% down from 11%.  Plan: Follow up with patient in 4-6 weeks.  Elayne Guerin, PharmD, [place] Clinical Pharmacist [phone]

## 2019-12-03 DIAGNOSIS — Z89512 Acquired absence of left leg below knee: Secondary | ICD-10-CM | POA: Diagnosis not present

## 2019-12-03 DIAGNOSIS — Z89511 Acquired absence of right leg below knee: Secondary | ICD-10-CM | POA: Diagnosis not present

## 2019-12-07 ENCOUNTER — Other Ambulatory Visit: Payer: Self-pay

## 2019-12-07 NOTE — Patient Outreach (Signed)
[facility]) Care Management  12/07/2019  ISAC FLORENTINE 1951-06-05 [id number]   Telephone call to patient for disease management.  No answer.  HIPAA compliant voice message left.  Plan: RN CM will attempt patient again in the month for January.    [name], RN, MSN [place] Management Care Management Coordinator Direct Line [phone] Cell [phone] Toll Free: [phone]  Fax: [phone]

## 2020-01-02 ENCOUNTER — Ambulatory Visit: Payer: Self-pay

## 2020-01-10 ENCOUNTER — Other Ambulatory Visit: Payer: Self-pay | Admitting: Pharmacist

## 2020-01-10 NOTE — Patient Outreach (Addendum)
[facility]) Care Management  01/10/2020  Shawn [name] 09/02/1951 [id number]   Patient was called for follow up on pill packs. HIPAA identifiers were obtained.    Patient is a 69 year old male with multiple medical conditions including but not limited to:  Type 2 diabetes with history of right lower extremity amputation and PAD.   THN has been following the patient for medication adherence. He was assisted with Pill Pack set-up with [facility].  Patient's medications were reviewed via telephone and with prescription fill history data:   Medications Reviewed Today    Reviewed by Elayne Guerin, [facility] (Pharmacist) on 01/10/20 at 1218  Med List Status: <None>  Medication Order Taking? Sig Documenting Provider Last Dose Status Informant  aspirin EC 81 MG tablet [id number] No Take 81 mg by mouth daily. [provider] Not Taking Active Care Giver  DULoxetine (CYMBALTA) 60 MG capsule [id number] Yes Take 60 mg by mouth daily. [provider] Taking Active Care Giver  gabapentin (NEURONTIN) 600 MG tablet [id number] Yes Take 600 mg by mouth 3 (three) times daily. [provider] Taking Active Care Giver  glipiZIDE (GLUCOTROL XL) 5 MG 24 hr tablet [id number] Yes Take 1 tablet by mouth daily. [provider] Taking Active   lisinopril (PRINIVIL,ZESTRIL) 2.5 MG tablet [id number] Yes Take 2.5 mg by mouth daily. [provider] Taking Active Care Giver  metFORMIN (GLUCOPHAGE) 1000 MG tablet [id number] Yes Take 1 tablet by mouth 2 (two) times daily. [provider] Taking Active   naproxen sodium (ANAPROX) 550 MG tablet [id number] Yes Take 550 mg by mouth 2 (two) times daily with a meal. [provider] Taking Active Care Giver  pioglitazone (ACTOS) 15 MG tablet [id number] Yes Take 15 mg by mouth daily. [provider] Taking Active Care Giver  pravastatin (PRAVACHOL) 40 MG tablet [id number] Yes Take 40 mg by mouth daily.  [provider] Taking Active Care Giver  sitaGLIPtin (JANUVIA) 100 MG tablet [place] [id number] Yes Take 100 mg by mouth daily. [provider] Taking Active Care Giver         Medication Findings: Aspirin is on the patient's medication list but he reported not taking it.  Patient is taking Naproxen daily as it in his pill box so not taking Aspirin is probably a good thing. A note will be sent to his provider to make sure he is aware patient is not taking Aspirin but is taking naproxen daily.     Plan: Call patient back in 2 weeks   Elayne Guerin, PharmD, [place] Clinical Pharmacist [phone]

## 2020-01-11 ENCOUNTER — Other Ambulatory Visit: Payer: Self-pay

## 2020-01-11 NOTE — Patient Outreach (Signed)
[place] [facility]) Care Management  [place]  01/11/2020   Shawn [name] Apr 16, 1951 [id number]  Subjective: Telephone call to patient for disease management check in. Patient reports that he is doing good. He denies any blood sugars over 200 but unable to recall last results.  Encouraged patient to continue to monitor sugars, take medications as prescribed, and limit carbohydrates in his diet.  He verbalized understanding. Patient reports that he needs to reschedule his follow up with his physician as he could not make the last appointment.  Encouraged patient to reschedule.  He verbalized understanding and denies any needs.    Objective:   Encounter Medications:  Outpatient Encounter Medications as of 01/11/2020  Medication Sig  . aspirin EC 81 MG tablet Take 81 mg by mouth daily.  . DULoxetine (CYMBALTA) 60 MG capsule Take 60 mg by mouth daily.  Marland Kitchen gabapentin (NEURONTIN) 600 MG tablet Take 600 mg by mouth 3 (three) times daily.  Marland Kitchen glipiZIDE (GLUCOTROL XL) 5 MG 24 hr tablet Take 1 tablet by mouth daily.  Marland Kitchen lisinopril (PRINIVIL,ZESTRIL) 2.5 MG tablet Take 2.5 mg by mouth daily.  . metFORMIN (GLUCOPHAGE) 1000 MG tablet Take 1 tablet by mouth 2 (two) times daily.  . naproxen sodium (ANAPROX) 550 MG tablet Take 550 mg by mouth 2 (two) times daily with a meal.  . pioglitazone (ACTOS) 15 MG tablet Take 15 mg by mouth daily.  . pravastatin (PRAVACHOL) 40 MG tablet Take 40 mg by mouth daily.  . sitaGLIPtin (JANUVIA) 100 MG tablet Take 100 mg by mouth daily.   No facility-administered encounter medications on file as of 01/11/2020.    Functional Status:  In your present state of health, do you have any difficulty performing the following activities: 07/18/2019  Hearing? N  Vision? N  Difficulty concentrating or making decisions? N  Walking or climbing stairs? Y  Comment bilateral amputee  Dressing or bathing? Y  Comment patient amputee and sponges off  Doing errands,  shopping? Y  Comment patient relies on family  Preparing Food and eating ? N  Using the Toilet? N  In the past six months, have you accidently leaked urine? N  Do you have problems with loss of bowel control? N  Managing your Medications? Y  Comment patient unable to see medications  Managing your Finances? Y  Comment brother handles bills  Housekeeping or managing your Housekeeping? Y  Comment patient needs assistance with cleaning  Some recent data might be hidden    Fall/Depression Screening: Fall Risk  01/11/2020 10/12/2019 07/18/2019  Falls in the past year? 0 0 0  Number falls in past yr: - - -  Injury with Fall? - - -   PHQ 2/9 Scores 07/18/2019 02/25/2018 02/18/2018  PHQ - 2 Score 0 0 0    Assessment: Patient continues to benefit from disease management.   Plan:  [facility] CM Care Plan Problem One     Most Recent Value  Care Plan Problem One  Uncontrolled diabetes  Role Documenting the Problem One  Care Management Telephonic Coordinator  Care Plan for Problem One  Active  THN Long Term Goal   Patient will keep sugars below 200 within 90 days  THN Long Term Goal Start Date  01/11/20  Interventions for Problem One Long Term Goal  RN CM discussed blood sugars with patient. No blood sugars over 200  per patient. Encouraged patient to [employer] and taking medications.      RN CM will  contact patient again in the month of March and patient agreeable.  [name], RN, MSN [place] Management Care Management Coordinator Direct Line [phone] Cell [phone] Toll Free: [phone]  Fax: [phone]

## 2020-01-25 ENCOUNTER — Other Ambulatory Visit: Payer: Self-pay | Admitting: Pharmacist

## 2020-01-25 ENCOUNTER — Ambulatory Visit: Payer: Self-pay | Admitting: Pharmacist

## 2020-01-25 NOTE — Patient Outreach (Addendum)
[facility] [facility]) Care Management  01/25/2020  Shawn [name] 07/18/1951 [id number]   Patient was called regarding medication adherence/Pill packs and to follow up on aspirin. Aspirin 81 mg is on the patient's medication list but he denied taking it during our last conversation.  In addition, patient receives all of his medications in pill pack [facility] and they reported not having an active prescription for Aspirin on file. Thus, Aspirin is not being put in the patient's pill pack.    Interestingly, Naproxen 550 mg 1 tablet twice daily is in the patient's pill pack.  A note was sent to the patient's PCP about ASA and Naproxen without response. The PCP listed in the sytem is actually an internal medicine provider.  Plan: Call patient back in 1-2 weeks to determine who his new PCP is and to further discuss aspirin.  Shawn [name], PharmD, [place] Clinical Pharmacist [phone]   Patient called me back. HIPAA identifiers were obtained. Patient confirmed he [facility] and both Aspirin 81mg  1 tablet daily and Naproxen 550mg  1 tablet twice daily are in his pill packs.  He also confirmed his PCP is Shawn [name], [place] in [place], [place].  A message was left with a nurse Shawn [name]) about Naproxen and Aspirin both being in the pill pack.  Shawn [name], PharmD, [place] Clinical Pharmacist [phone]

## 2020-02-08 ENCOUNTER — Other Ambulatory Visit: Payer: Self-pay | Admitting: Pharmacist

## 2020-02-08 NOTE — Patient Outreach (Addendum)
[place] [facility]) Care Management  02/08/2020  LEVEN DROESSLER 05/08/1951 [id number]   Patient was called regarding medication adherence and to talk about aspirin and naproxen.  HIPAA identifiers were obtained.  Patient confirmed again he is taking both naproxen and aspirin daily.  ([place] is no fill history for Aspirin). He previously said he wasn't taking it, then he said he was.  His provider's office documented there is a potential  patient has been taking both and did not make any changes.  Patient reported everything was going well with his medications and he is still getting them [facility].  HgA1c 9.6% 10/11/2019 down from 11.2%  Plan: Call patient back in 2-3 months for check in.  Elayne Guerin, PharmD, [place] Clinical Pharmacist [phone]

## 2020-03-07 ENCOUNTER — Other Ambulatory Visit: Payer: Self-pay

## 2020-03-07 NOTE — Patient Outreach (Signed)
[facility]) Care Management  03/07/2020  Shawn [name] [birth year] [id number]   Telephone call to patient for bi-monthly check in.  No answer.  HIPAA compliant voice message left.    Plan: RN CM will wait return call. If no return call will attempt again in the month of May.  RN CM will also send letter.  [name], RN, MSN [place] Management Care Management Coordinator Direct Line [phone] Cell [phone] Toll Free: [phone]  Fax: [phone]

## 2020-03-13 DIAGNOSIS — F329 Major depressive disorder, single episode, unspecified: Secondary | ICD-10-CM | POA: Diagnosis not present

## 2020-03-13 DIAGNOSIS — E118 Type 2 diabetes mellitus with unspecified complications: Secondary | ICD-10-CM | POA: Diagnosis not present

## 2020-03-13 DIAGNOSIS — G546 Phantom limb syndrome with pain: Secondary | ICD-10-CM | POA: Diagnosis not present

## 2020-03-13 DIAGNOSIS — E7800 Pure hypercholesterolemia, unspecified: Secondary | ICD-10-CM | POA: Diagnosis not present

## 2020-03-13 DIAGNOSIS — Z6828 Body mass index (BMI) 28.0-28.9, adult: Secondary | ICD-10-CM | POA: Diagnosis not present

## 2020-03-13 DIAGNOSIS — Z89511 Acquired absence of right leg below knee: Secondary | ICD-10-CM | POA: Diagnosis not present

## 2020-03-13 DIAGNOSIS — Z89512 Acquired absence of left leg below knee: Secondary | ICD-10-CM | POA: Diagnosis not present

## 2020-03-13 DIAGNOSIS — I10 Essential (primary) hypertension: Secondary | ICD-10-CM | POA: Diagnosis not present

## 2020-03-20 ENCOUNTER — Other Ambulatory Visit: Payer: Self-pay | Admitting: Pharmacist

## 2020-03-20 NOTE — Patient Outreach (Signed)
[facility])  [place] Team    [facility] pharmacy case will be closed as our team is transitioning from the [place] Management Department into the [facility] Quality Department and will no longer be using [facility] for documentation purposes.     Patient is still being followed by [facility].  Elayne Guerin, PharmD, [place] Clinical Pharmacist [phone]

## 2020-05-02 ENCOUNTER — Other Ambulatory Visit: Payer: Self-pay

## 2020-05-02 NOTE — Patient Outreach (Signed)
[place] [facility]) Care Management  05/02/2020  SRIJAN BRUNGARDT 1951-08-18 [id number]   Telephone call to patient for disease management follow up.  No answer. HIPAA compliant voice message left.  Plan: RN CM will attempt patient again in the month of August.    Greogory Cornette J Olga Seyler, RN, MSN [place] Management Care Management Coordinator Direct Line [phone] Cell [phone] Toll Free: [phone]  Fax: [phone]

## 2020-05-10 ENCOUNTER — Ambulatory Visit: Payer: Self-pay | Admitting: Pharmacist

## 2020-06-19 DIAGNOSIS — I10 Essential (primary) hypertension: Secondary | ICD-10-CM | POA: Diagnosis not present

## 2020-06-19 DIAGNOSIS — Z125 Encounter for screening for malignant neoplasm of prostate: Secondary | ICD-10-CM | POA: Diagnosis not present

## 2020-06-19 DIAGNOSIS — Z6828 Body mass index (BMI) 28.0-28.9, adult: Secondary | ICD-10-CM | POA: Diagnosis not present

## 2020-06-19 DIAGNOSIS — Z89512 Acquired absence of left leg below knee: Secondary | ICD-10-CM | POA: Diagnosis not present

## 2020-06-19 DIAGNOSIS — E7800 Pure hypercholesterolemia, unspecified: Secondary | ICD-10-CM | POA: Diagnosis not present

## 2020-06-19 DIAGNOSIS — Z89511 Acquired absence of right leg below knee: Secondary | ICD-10-CM | POA: Diagnosis not present

## 2020-06-19 DIAGNOSIS — G546 Phantom limb syndrome with pain: Secondary | ICD-10-CM | POA: Diagnosis not present

## 2020-06-19 DIAGNOSIS — F329 Major depressive disorder, single episode, unspecified: Secondary | ICD-10-CM | POA: Diagnosis not present

## 2020-06-19 DIAGNOSIS — E118 Type 2 diabetes mellitus with unspecified complications: Secondary | ICD-10-CM | POA: Diagnosis not present

## 2020-08-01 ENCOUNTER — Other Ambulatory Visit: Payer: Self-pay

## 2020-08-01 NOTE — Patient Outreach (Signed)
[place] [facility]) Care Management  [place]  08/01/2020   Shawn [name] 1950/12/18 [id number]  Subjective: Telephone call to patient for disease management follow up. Patient reports he is doing good.  He states he saw his PCP about 2 weeks ago and was told his sugars have been doing good. Patient continues to get medications in pill packs and reports that he is taking them as prescribed.  Patient has not been checking sugars.  Advised patient to start back checking his sugars in order to know how he is doing with sugar control.  He verbalized understanding.  Patient states his brother still helps him with things.  He uses his electric scooter to get around.  He voices no concerns.     Objective:   Encounter Medications:  Outpatient Encounter Medications as of 08/01/2020  Medication Sig Note  . aspirin EC 81 MG tablet Take 81 mg by mouth daily. 01/25/2020: Patient gets his medications in pill pack and aspirin is not being put in his packs. Patient denies taking ASA  . DULoxetine (CYMBALTA) 60 MG capsule Take 60 mg by mouth daily.   Marland Kitchen gabapentin (NEURONTIN) 600 MG tablet Take 600 mg by mouth 3 (three) times daily.   Marland Kitchen glipiZIDE (GLUCOTROL XL) 5 MG 24 hr tablet Take 1 tablet by mouth daily.   Marland Kitchen lisinopril (PRINIVIL,ZESTRIL) 2.5 MG tablet Take 2.5 mg by mouth daily.   . metFORMIN (GLUCOPHAGE) 1000 MG tablet Take 1 tablet by mouth 2 (two) times daily.   . naproxen sodium (ANAPROX) 550 MG tablet Take 550 mg by mouth 2 (two) times daily with a meal.   . pioglitazone (ACTOS) 15 MG tablet Take 15 mg by mouth daily.   . pravastatin (PRAVACHOL) 40 MG tablet Take 40 mg by mouth daily.   . sitaGLIPtin (JANUVIA) 100 MG tablet Take 100 mg by mouth daily.    No facility-administered encounter medications on file as of 08/01/2020.    Functional Status:  In your present state of health, do you have any difficulty performing the following activities: 08/01/2020  Hearing? N  Vision? N   Difficulty concentrating or making decisions? N  Walking or climbing stairs? Y  Comment bilateral amputee  Dressing or bathing? Y  Comment patient amputee and sponges off  Doing errands, shopping? Y  Comment patient relies on family  Preparing Food and eating ? N  Using the Toilet? N  In the past six months, have you accidently leaked urine? N  Do you have problems with loss of bowel control? N  Managing your Medications? Y  Comment unable to see medications  Managing your Finances? Y  Comment brother helps  Housekeeping or managing your Housekeeping? Y  Comment patient needs assistance  Some recent data might be hidden    Fall/Depression Screening: Fall Risk  08/01/2020 01/11/2020 10/12/2019  Falls in the past year? 0 0 0  Number falls in past yr: - - -  Injury with Fall? - - -   PHQ 2/9 Scores 08/01/2020 07/18/2019 02/25/2018 02/18/2018  PHQ - 2 Score 0 0 0 0   SDOH Screenings   Alcohol Screen:   . Last Alcohol Screening Score (AUDIT):   Depression (PHQ2-9): Low Risk   . PHQ-2 Score: 0  Financial Resource Strain:   . Difficulty of Paying Living Expenses:   Food Insecurity:   . Worried About Charity fundraiser in the Last Year:   . [place] in the Last Year:  Housing: Low Risk   . Last Housing Risk Score: 0  Physical Activity:   . Days of Exercise per Week:   . Minutes of Exercise per Session:   Social Connections:   . Frequency of Communication with Friends and Family:   . Frequency of Social Gatherings with Friends and Family:   . Attends Religious Services:   . Active Member of Clubs or Organizations:   . Attends Archivist Meetings:   Marland Kitchen Marital Status:   Stress:   . Feeling of Stress :   Tobacco Use:   . Smoking Tobacco Use:   . Smokeless Tobacco Use:   Transportation Needs:   . Lack of Transportation (Medical):   Marland Kitchen Lack of Transportation (Non-Medical):      Assessment: Patient continues to manage chronic health conditions. No recent  hospitalizations.  Plan:  [facility] CM Care Plan Problem One     Most Recent Value  Care Plan Problem One Uncontrolled diabetes  Role Documenting the Problem One Care Management Telephonic Coordinator  Care Plan for Problem One Active  THN Long Term Goal  Patient will keep sugars below 200 within 90 days  THN Long Term Goal Start Date 08/01/20  Interventions for Problem One Long Term Goal Patient not checking sugars regularly.  Discussed importance of monitoring sugars.  Patient taking medications as prescribed.  Patient sees PCP regularly.       RN CM will contact patient again in the month of November and patient agreeable.  [name], RN, MSN [place] Management Care Management Coordinator Direct Line [phone] Cell [phone] Toll Free: [phone]  Fax: [phone]

## 2020-08-14 DIAGNOSIS — E119 Type 2 diabetes mellitus without complications: Secondary | ICD-10-CM | POA: Diagnosis not present

## 2020-10-29 DIAGNOSIS — G546 Phantom limb syndrome with pain: Secondary | ICD-10-CM | POA: Diagnosis not present

## 2020-10-29 DIAGNOSIS — Z89512 Acquired absence of left leg below knee: Secondary | ICD-10-CM | POA: Diagnosis not present

## 2020-10-29 DIAGNOSIS — F32A Depression, unspecified: Secondary | ICD-10-CM | POA: Diagnosis not present

## 2020-10-29 DIAGNOSIS — E7800 Pure hypercholesterolemia, unspecified: Secondary | ICD-10-CM | POA: Diagnosis not present

## 2020-10-29 DIAGNOSIS — Z6828 Body mass index (BMI) 28.0-28.9, adult: Secondary | ICD-10-CM | POA: Diagnosis not present

## 2020-10-29 DIAGNOSIS — E118 Type 2 diabetes mellitus with unspecified complications: Secondary | ICD-10-CM | POA: Diagnosis not present

## 2020-10-29 DIAGNOSIS — Z89511 Acquired absence of right leg below knee: Secondary | ICD-10-CM | POA: Diagnosis not present

## 2020-10-29 DIAGNOSIS — I10 Essential (primary) hypertension: Secondary | ICD-10-CM | POA: Diagnosis not present

## 2020-10-31 ENCOUNTER — Other Ambulatory Visit: Payer: Self-pay

## 2020-10-31 NOTE — Patient Outreach (Signed)
[place] [facility]) Care Management  10/31/2020  Shawn [name] 10-02-1951 [id number]   Telephone call to patient for disease management follow up.   No answer.  HIPAA compliant voice message left.    Plan: If no return call, RN CM will attempt patient again in the month of February.  [name], RN, MSN [place] Management Care Management Coordinator Direct Line [phone] Cell [phone] Toll Free: [phone]  Fax: [phone]

## 2021-01-16 ENCOUNTER — Other Ambulatory Visit: Payer: Self-pay

## 2021-01-16 NOTE — Patient Instructions (Signed)
Goals    . THN-Make and Keep All Appointments     Timeframe:  Long-Range Goal Priority:  High Start Date:    01/16/21                         Expected End Date:    08/14/21                   Follow Up Date 05/14/21    - call to cancel if needed    Why is this important?    Part of staying healthy is seeing the doctor for follow-up care.   If you forget your appointments, there are some things you can do to stay on track.    Notes: 01/16/21 Patient lives up the street from MD.  [name] his scooter to get to appointments.      . THN-Monitor and Manage My Blood Sugar-Diabetes Type 2     Timeframe:  Long-Range Goal Priority:  High Start Date:    01/16/21                         Expected End Date:     08/14/21                  Follow Up Date 05/14/21   - check blood sugar at prescribed times    Why is this important?    Checking your blood sugar at home helps to keep it from getting very high or very low.   Writing the results in a diary or log helps the doctor know how to care for you.   Your blood sugar log should have the time, date and the results.   Also, write down the amount of insulin or other medicine that you take.   Other information, like what you ate, exercise done and how you were feeling, will also be helpful.     Notes: Encouraged patient to check sugars at least weekly.

## 2021-01-16 NOTE — Patient Outreach (Signed)
[facility]) Care Management  [place]  01/16/2021   Shawn [name] 1951/04/22 [id number]  Subjective: Telephone call for disease management follow up. Patient states he is doing good. He reports blood sugars less than 200 when he does check.  He admits to not checking regularly. Encouraged patient to check at least weekly.  He verbalized understanding.     Objective:   Encounter Medications:  Outpatient Encounter Medications as of 01/16/2021  Medication Sig Note  . aspirin EC 81 MG tablet Take 81 mg by mouth daily. 01/25/2020: Patient gets his medications in pill pack and aspirin is not being put in his packs. Patient denies taking ASA  . DULoxetine (CYMBALTA) 60 MG capsule Take 60 mg by mouth daily.   Marland Kitchen gabapentin (NEURONTIN) 600 MG tablet Take 600 mg by mouth 3 (three) times daily.   Marland Kitchen glipiZIDE (GLUCOTROL XL) 5 MG 24 hr tablet Take 1 tablet by mouth daily.   Marland Kitchen lisinopril (PRINIVIL,ZESTRIL) 2.5 MG tablet Take 2.5 mg by mouth daily.   . metFORMIN (GLUCOPHAGE) 1000 MG tablet Take 1 tablet by mouth 2 (two) times daily.   . naproxen sodium (ANAPROX) 550 MG tablet Take 550 mg by mouth 2 (two) times daily with a meal.   . pioglitazone (ACTOS) 15 MG tablet Take 15 mg by mouth daily.   . pravastatin (PRAVACHOL) 40 MG tablet Take 40 mg by mouth daily.   . sitaGLIPtin (JANUVIA) 100 MG tablet Take 100 mg by mouth daily.    No facility-administered encounter medications on file as of 01/16/2021.    Functional Status:  In your present state of health, do you have any difficulty performing the following activities: 08/01/2020  Hearing? N  Vision? N  Difficulty concentrating or making decisions? N  Walking or climbing stairs? Y  Comment bilateral amputee  Dressing or bathing? Y  Comment patient amputee and sponges off  Doing errands, shopping? Y  Comment patient relies on family  Preparing Food and eating ? N  Using the Toilet? N  In the past six months, have you  accidently leaked urine? N  Do you have problems with loss of bowel control? N  Managing your Medications? Y  Comment unable to see medications  Managing your Finances? Y  Comment brother helps  Housekeeping or managing your Housekeeping? Y  Comment patient needs assistance  Some recent data might be hidden    Fall/Depression Screening: Fall Risk  01/16/2021 08/01/2020 01/11/2020  Falls in the past year? 0 0 0  Number falls in past yr: - - -  Injury with Fall? - - -   PHQ 2/9 Scores 01/16/2021 08/01/2020 07/18/2019 02/25/2018 02/18/2018  PHQ - 2 Score 0 0 0 0 0    Assessment: Patient managing chronic conditions.   Goals Addressed            This Visit's Progress   . THN-Make and Keep All Appointments       Timeframe:  Long-Range Goal Priority:  High Start Date:    01/16/21                         Expected End Date:    08/14/21                   Follow Up Date 05/14/21    - call to cancel if needed    Why is this important?    Part of staying healthy is seeing  the doctor for follow-up care.   If you forget your appointments, there are some things you can do to stay on track.    Notes: 01/16/21 Patient lives up the street from MD.  [name] his scooter to get to appointments.      . THN-Monitor and Manage My Blood Sugar-Diabetes Type 2       Timeframe:  Long-Range Goal Priority:  High Start Date:    01/16/21                         Expected End Date:     08/14/21                  Follow Up Date 05/14/21   - check blood sugar at prescribed times    Why is this important?    Checking your blood sugar at home helps to keep it from getting very high or very low.   Writing the results in a diary or log helps the doctor know how to care for you.   Your blood sugar log should have the time, date and the results.   Also, write down the amount of insulin or other medicine that you take.   Other information, like what you ate, exercise done and how you were feeling, will also be  helpful.     Notes: Encouraged patient to check sugars at least weekly.         Plan: RN CM will follow up in May. Follow-up:  Patient agrees to Care Plan and Follow-up.   [name], RN, MSN [place] Management Care Management Coordinator Direct Line [phone] Cell [phone] Toll Free: [phone]  Fax: [phone]

## 2021-01-29 DIAGNOSIS — Z89511 Acquired absence of right leg below knee: Secondary | ICD-10-CM | POA: Diagnosis not present

## 2021-01-29 DIAGNOSIS — Z6828 Body mass index (BMI) 28.0-28.9, adult: Secondary | ICD-10-CM | POA: Diagnosis not present

## 2021-01-29 DIAGNOSIS — E7800 Pure hypercholesterolemia, unspecified: Secondary | ICD-10-CM | POA: Diagnosis not present

## 2021-01-29 DIAGNOSIS — F32A Depression, unspecified: Secondary | ICD-10-CM | POA: Diagnosis not present

## 2021-01-29 DIAGNOSIS — Z89512 Acquired absence of left leg below knee: Secondary | ICD-10-CM | POA: Diagnosis not present

## 2021-01-29 DIAGNOSIS — I10 Essential (primary) hypertension: Secondary | ICD-10-CM | POA: Diagnosis not present

## 2021-01-29 DIAGNOSIS — G546 Phantom limb syndrome with pain: Secondary | ICD-10-CM | POA: Diagnosis not present

## 2021-01-29 DIAGNOSIS — E1140 Type 2 diabetes mellitus with diabetic neuropathy, unspecified: Secondary | ICD-10-CM | POA: Diagnosis not present

## 2021-01-30 ENCOUNTER — Ambulatory Visit: Payer: Self-pay

## 2021-04-26 ENCOUNTER — Other Ambulatory Visit: Payer: Self-pay

## 2021-04-26 DIAGNOSIS — Z89512 Acquired absence of left leg below knee: Secondary | ICD-10-CM | POA: Diagnosis not present

## 2021-04-26 DIAGNOSIS — E7800 Pure hypercholesterolemia, unspecified: Secondary | ICD-10-CM | POA: Diagnosis not present

## 2021-04-26 DIAGNOSIS — E118 Type 2 diabetes mellitus with unspecified complications: Secondary | ICD-10-CM | POA: Diagnosis not present

## 2021-04-26 DIAGNOSIS — Z6828 Body mass index (BMI) 28.0-28.9, adult: Secondary | ICD-10-CM | POA: Diagnosis not present

## 2021-04-26 DIAGNOSIS — F32A Depression, unspecified: Secondary | ICD-10-CM | POA: Diagnosis not present

## 2021-04-26 DIAGNOSIS — Z89511 Acquired absence of right leg below knee: Secondary | ICD-10-CM | POA: Diagnosis not present

## 2021-04-26 DIAGNOSIS — I10 Essential (primary) hypertension: Secondary | ICD-10-CM | POA: Diagnosis not present

## 2021-04-26 DIAGNOSIS — G546 Phantom limb syndrome with pain: Secondary | ICD-10-CM | POA: Diagnosis not present

## 2021-04-26 NOTE — Patient Outreach (Signed)
[facility]) Care Management  Shawn [name]  04/26/2021   Shawn [name] 1951-05-18 [id number]  Subjective: Telephone call to patient for disease management follow up.  Patient reports he is doing ok.  He states he saw his PCP today and visit went well.  He states he is working to get some help in the home. He states he now has both medicare and medicaid now.  A1c is 7.7 Discussed continued diabetes management.  He verbalized understanding.    Objective:   Encounter Medications:  Outpatient Encounter Medications as of 04/26/2021  Medication Sig Note  . aspirin EC 81 MG tablet Take 81 mg by mouth daily. 01/25/2020: Patient gets his medications in pill pack and aspirin is not being put in his packs. Patient denies taking ASA  . DULoxetine (CYMBALTA) 60 MG capsule Take 60 mg by mouth daily.   Marland Kitchen gabapentin (NEURONTIN) 600 MG tablet Take 600 mg by mouth 3 (three) times daily.   Marland Kitchen glipiZIDE (GLUCOTROL XL) 5 MG 24 hr tablet Take 1 tablet by mouth daily.   Marland Kitchen lisinopril (PRINIVIL,ZESTRIL) 2.5 MG tablet Take 2.5 mg by mouth daily.   . metFORMIN (GLUCOPHAGE) 1000 MG tablet Take 1 tablet by mouth 2 (two) times daily.   . naproxen sodium (ANAPROX) 550 MG tablet Take 550 mg by mouth 2 (two) times daily with a meal.   . pioglitazone (ACTOS) 15 MG tablet Take 15 mg by mouth daily.   . pravastatin (PRAVACHOL) 40 MG tablet Take 40 mg by mouth daily.   . sitaGLIPtin (JANUVIA) 100 MG tablet Take 100 mg by mouth daily.    No facility-administered encounter medications on file as of 04/26/2021.    Functional Status:  In your present state of health, do you have any difficulty performing the following activities: 04/26/2021 08/01/2020  Hearing? N N  Vision? N N  Difficulty concentrating or making decisions? N N  Walking or climbing stairs? Shawn [name]  Comment bilateral amputee bilateral amputee  Dressing or bathing? Shawn [name]  Comment patient amputee and sponges off patient amputee and sponges off   Doing errands, shopping? Shawn [name]  Comment patient relies on family patient relies on family  Preparing Food and eating ? N N  Using the Toilet? N N  In the past six months, have you accidently leaked urine? N N  Do you have problems with loss of bowel control? N N  Managing your Medications? Y Y  Comment uses pill packet unable to see medications  Managing your Finances? Shawn [name]  Comment brother helps brother helps  Housekeeping or managing your Housekeeping? Shawn [name]  Comment family assists at times patient needs assistance  Some recent data might be hidden    Fall/Depression Screening: Fall Risk  04/26/2021 01/16/2021 08/01/2020  Falls in the past year? 0 0 0  Number falls in past yr: - - -  Injury with Fall? - - -   PHQ 2/9 Scores 01/16/2021 08/01/2020 07/18/2019 02/25/2018 02/18/2018  PHQ - 2 Score 0 0 0 0 0    Assessment:  Goals Addressed            This Visit's Progress   . THN-Make and Keep All Appointments   On track    Barriers: None Timeframe:  Long-Range Goal Priority:  High Start Date:    01/16/21                         Expected End  Date:    12/14/21              Follow Up Date 08/14/21    - ask family or friend for a ride    Why is this important?    Part of staying healthy is seeing the doctor for follow-up care.   If you forget your appointments, there are some things you can do to stay on track.    Notes: 01/16/21 Patient lives up the street from MD.  Shawn [name] his scooter to get to appointments.   04/26/21 Patient continues to use scooter to get to MD Appointment.      . THN-Monitor and Manage My Blood Sugar-Diabetes Type 2   On track    Barriers: Knowledge Timeframe:  Long-Range Goal Priority:  High Start Date:    01/16/21                         Expected End Date:   12/14/21                Follow Up Date 08/14/21   - check blood sugar at prescribed times - take the blood sugar log to all doctor visits    Why is this important?    Checking your blood sugar at  home helps to keep it from getting very high or very low.   Writing the results in a diary or log helps the doctor know how to care for you.   Your blood sugar log should have the time, date and the results.   Also, write down the amount of insulin or other medicine that you take.   Other information, like what you ate, exercise done and how you were feeling, will also be helpful.     Notes: Encouraged patient to check sugars at least weekly.   04/26/21 A1c 7.7.        Plan: RN CM will contact in August. Follow-up:  Patient agrees to Care Plan and Follow-up.   Shawn Baseman, RN, MSN [place] Management Care Management Coordinator Direct Line [phone] Cell [phone] Toll Free: [phone]  Fax: [phone]

## 2021-05-01 ENCOUNTER — Ambulatory Visit: Payer: Self-pay

## 2021-07-29 ENCOUNTER — Other Ambulatory Visit: Payer: Self-pay

## 2021-07-29 NOTE — Patient Outreach (Signed)
[name]) Care Management  [name]  07/29/2021   Shawn [name] February 09, 1951 [id number]  Subjective: Telephone call to patient for disease management. Discussed diabetes management and importance.  He voices no concerns.   Objective:   Encounter Medications:  Outpatient Encounter Medications as of 07/29/2021  Medication Sig Note   aspirin EC 81 MG tablet Take 81 mg by mouth daily. 01/25/2020: Patient gets his medications in pill pack and aspirin is not being put in his packs. Patient denies taking ASA   DULoxetine (CYMBALTA) 60 MG capsule Take 60 mg by mouth daily.    gabapentin (NEURONTIN) 600 MG tablet Take 600 mg by mouth 3 (three) times daily.    glipiZIDE (GLUCOTROL XL) 5 MG 24 hr tablet Take 1 tablet by mouth daily.    lisinopril (PRINIVIL,ZESTRIL) 2.5 MG tablet Take 2.5 mg by mouth daily.    metFORMIN (GLUCOPHAGE) 1000 MG tablet Take 1 tablet by mouth 2 (two) times daily.    naproxen sodium (ANAPROX) 550 MG tablet Take 550 mg by mouth 2 (two) times daily with a meal.    pioglitazone (ACTOS) 15 MG tablet Take 15 mg by mouth daily.    pravastatin (PRAVACHOL) 40 MG tablet Take 40 mg by mouth daily.    sitaGLIPtin (JANUVIA) 100 MG tablet Take 100 mg by mouth daily.    No facility-administered encounter medications on file as of 07/29/2021.    Functional Status:  In your present state of health, do you have any difficulty performing the following activities: 04/26/2021 08/01/2020  Hearing? N N  Vision? N N  Difficulty concentrating or making decisions? N N  Walking or climbing stairs? [name]  Comment bilateral amputee bilateral amputee  Dressing or bathing? [name]  Comment patient amputee and sponges off patient amputee and sponges off  Doing errands, shopping? [name]  Comment patient relies on family patient relies on family  Preparing Food and eating ? N N  Using the Toilet? N N  In the past six months, have you accidently leaked urine? N N  Do you have  problems with loss of bowel control? N N  Managing your Medications? Y Y  Comment uses pill packet unable to see medications  Managing your Finances? [name]  Comment brother helps brother helps  Housekeeping or managing your Housekeeping? [name]  Comment family assists at times patient needs assistance  Some recent data might be hidden    Fall/Depression Screening: Fall Risk  04/26/2021 01/16/2021 08/01/2020  Falls in the past year? 0 0 0  Number falls in past yr: - - -  Injury with Fall? - - -   PHQ 2/9 Scores 01/16/2021 08/01/2020 07/18/2019 02/25/2018 02/18/2018  PHQ - 2 Score 0 0 0 0 0    Assessment:   Care Plan Care Plan : Diabetes Type 2 (Adult)  Updates made by [name], RN since 07/29/2021 12:00 AM     Problem: Glycemic Management (Diabetes, Type 2)      Long-Range Goal: Glycemic Management Optimized as evidenced by A1c lower than 9.0   Start Date: 01/16/2021  Expected End Date: 12/14/2021  This Visit's Progress: Not on track  Recent Progress: On track  Priority: High  Note:   Evidence-based guidance:   Provide medical nutrition therapy and development of individualized eating.  Compare self-reported symptoms of hypo or hyperglycemia to blood glucose levels, diet and fluid intake, current medications, psychosocial and physiologic stressors, change in activity and barriers to care  adherence.  Promote self-monitoring of blood glucose levels.  Assess and address barriers to management plan, such as food insecurity, age, developmental ability, depression, anxiety, fear of hypoglycemia or weight gain, as well as medication cost, side effects and complicated regimen.  Consider referral to community-based diabetes education program, visiting nurse, community health worker or health coach.  Encourage regular dental care for treatment of periodontal disease; refer to dental provider when needed.   Notes:     Task: Alleviate Barriers to Glycemic Management   Due Date: 12/14/2021   Priority: Routine  Responsible User: [name], RN  Note:   Care Management Activities:    - blood glucose monitoring encouraged - blood glucose readings reviewed    Notes: 01/16/21 Patient not not checking sugars regularly. Patient agrees to try to check at least weekly.   04/26/21 Patient continues to not check sugars regularly. A1c 7.7. 07/29/21 Patient not checking his sugars.  Retierated the importance of checking sugars and following a diabetic diet. He verbalized understanding.  Last A1c in May was 11.1  Patient aware of A1c and states he will do better.         Goals Addressed             This Visit's Progress    COMPLETED: THN-Make and Keep All Appointments   On track    Barriers: None Timeframe:  Long-Range Goal Priority:  High Start Date:    01/16/21                         Expected End Date:    12/14/21              Follow Up Date 08/14/21    - ask family or friend for a ride    Why is this important?   Part of staying healthy is seeing the doctor for follow-up care.  If you forget your appointments, there are some things you can do to stay on track.    Notes: 01/16/21 Patient lives up the street from MD.  [name] his scooter to get to appointments.   04/26/21 Patient continues to use scooter to get to MD Appointment.       THN-Monitor and Manage My Blood Sugar-Diabetes Type 2 as evidenced by patient reporting blood sugars.   Not on track    Barriers: Knowledge  Health Behaviors Timeframe:  Long-Range Goal Priority:  High Start Date:    01/16/21                         Expected End Date:   12/14/21                Follow Up Date 10/14/21   - check blood sugar at prescribed times - take the blood sugar log to all doctor visits    Why is this important?   Checking your blood sugar at home helps to keep it from getting very high or very low.  Writing the results in a diary or log helps the doctor know how to care for you.  Your blood sugar log should  have the time, date and the results.  Also, write down the amount of insulin or other medicine that you take.  Other information, like what you ate, exercise done and how you were feeling, will also be helpful.     Notes: Encouraged patient to check sugars at least weekly.   04/26/21 A1c 7.7.  07/29/21 Patient not checking his sugars.  Retierated the importance of checking sugars and following a diabetic diet. He verbalized understanding.  Last A1c in May was 11.1  Patient aware of A1c and states he will do better.         Plan:  Follow-up: Patient agrees to Care Plan and Follow-up. Follow-up in 2 month(s)   [name], RN, MSN [place] Management Care Management Coordinator Direct Line [phone] Cell [phone] Toll Free: [phone]  Fax: [phone]

## 2021-07-29 NOTE — Patient Instructions (Signed)
Goals Addressed             This Visit's Progress    COMPLETED: THN-Make and Keep All Appointments   On track    Barriers: None Timeframe:  Long-Range Goal Priority:  High Start Date:    01/16/21                         Expected End Date:    12/14/21              Follow Up Date 08/14/21    - ask family or friend for a ride    Why is this important?   Part of staying healthy is seeing the doctor for follow-up care.  If you forget your appointments, there are some things you can do to stay on track.    Notes: 01/16/21 Patient lives up the street from MD.  [name] his scooter to get to appointments.   04/26/21 Patient continues to use scooter to get to MD Appointment.       THN-Monitor and Manage My Blood Sugar-Diabetes Type 2 as evidenced by patient reporting blood sugars.   Not on track    Barriers: Knowledge  Health Behaviors Timeframe:  Long-Range Goal Priority:  High Start Date:    01/16/21                         Expected End Date:   12/14/21                Follow Up Date 10/14/21   - check blood sugar at prescribed times - take the blood sugar log to all doctor visits    Why is this important?   Checking your blood sugar at home helps to keep it from getting very high or very low.  Writing the results in a diary or log helps the doctor know how to care for you.  Your blood sugar log should have the time, date and the results.  Also, write down the amount of insulin or other medicine that you take.  Other information, like what you ate, exercise done and how you were feeling, will also be helpful.     Notes: Encouraged patient to check sugars at least weekly.   04/26/21 A1c 7.7.  07/29/21 Patient not checking his sugars.  Retierated the importance of checking sugars and following a diabetic diet. He verbalized understanding.  Last A1c in May was 11.1  Patient aware of A1c and states he will do better.

## 2021-07-31 ENCOUNTER — Ambulatory Visit: Payer: Self-pay

## 2021-08-05 DIAGNOSIS — F32A Depression, unspecified: Secondary | ICD-10-CM | POA: Diagnosis not present

## 2021-08-05 DIAGNOSIS — Z89512 Acquired absence of left leg below knee: Secondary | ICD-10-CM | POA: Diagnosis not present

## 2021-08-05 DIAGNOSIS — G546 Phantom limb syndrome with pain: Secondary | ICD-10-CM | POA: Diagnosis not present

## 2021-08-05 DIAGNOSIS — Z6828 Body mass index (BMI) 28.0-28.9, adult: Secondary | ICD-10-CM | POA: Diagnosis not present

## 2021-08-05 DIAGNOSIS — E7800 Pure hypercholesterolemia, unspecified: Secondary | ICD-10-CM | POA: Diagnosis not present

## 2021-08-05 DIAGNOSIS — E1142 Type 2 diabetes mellitus with diabetic polyneuropathy: Secondary | ICD-10-CM | POA: Diagnosis not present

## 2021-08-05 DIAGNOSIS — Z89511 Acquired absence of right leg below knee: Secondary | ICD-10-CM | POA: Diagnosis not present

## 2021-08-05 DIAGNOSIS — I10 Essential (primary) hypertension: Secondary | ICD-10-CM | POA: Diagnosis not present

## 2021-08-21 DIAGNOSIS — R000 Tachycardia, unspecified: Secondary | ICD-10-CM | POA: Diagnosis not present

## 2021-08-21 DIAGNOSIS — R404 Transient alteration of awareness: Secondary | ICD-10-CM | POA: Diagnosis not present

## 2021-08-21 DIAGNOSIS — R739 Hyperglycemia, unspecified: Secondary | ICD-10-CM | POA: Diagnosis not present

## 2021-08-21 DIAGNOSIS — R0902 Hypoxemia: Secondary | ICD-10-CM | POA: Diagnosis not present

## 2021-08-22 ENCOUNTER — Emergency Department (HOSPITAL_COMMUNITY): Payer: Medicare HMO

## 2021-08-22 ENCOUNTER — Observation Stay (HOSPITAL_COMMUNITY): Payer: Medicare HMO

## 2021-08-22 ENCOUNTER — Inpatient Hospital Stay (HOSPITAL_COMMUNITY)
Admission: EM | Admit: 2021-08-22 | Discharge: 2021-08-29 | DRG: 064 | Disposition: A | Discharge status: Skilled Nursing Facility | Payer: Medicare HMO | Attending: Internal Medicine | Admitting: Internal Medicine

## 2021-08-22 ENCOUNTER — Encounter (HOSPITAL_COMMUNITY): Payer: Self-pay

## 2021-08-22 ENCOUNTER — Other Ambulatory Visit: Payer: Self-pay

## 2021-08-22 DIAGNOSIS — Z7982 Long term (current) use of aspirin: Secondary | ICD-10-CM | POA: Diagnosis not present

## 2021-08-22 DIAGNOSIS — E1151 Type 2 diabetes mellitus with diabetic peripheral angiopathy without gangrene: Secondary | ICD-10-CM | POA: Diagnosis present

## 2021-08-22 DIAGNOSIS — R509 Fever, unspecified: Secondary | ICD-10-CM | POA: Diagnosis not present

## 2021-08-22 DIAGNOSIS — R29810 Facial weakness: Secondary | ICD-10-CM | POA: Diagnosis present

## 2021-08-22 DIAGNOSIS — Z1812 Retained nonmagnetic metal fragments: Secondary | ICD-10-CM

## 2021-08-22 DIAGNOSIS — R471 Dysarthria and anarthria: Secondary | ICD-10-CM | POA: Diagnosis present

## 2021-08-22 DIAGNOSIS — E538 Deficiency of other specified B group vitamins: Secondary | ICD-10-CM | POA: Diagnosis present

## 2021-08-22 DIAGNOSIS — I513 Intracardiac thrombosis, not elsewhere classified: Secondary | ICD-10-CM | POA: Diagnosis present

## 2021-08-22 DIAGNOSIS — I5041 Acute combined systolic (congestive) and diastolic (congestive) heart failure: Secondary | ICD-10-CM | POA: Diagnosis present

## 2021-08-22 DIAGNOSIS — I429 Cardiomyopathy, unspecified: Secondary | ICD-10-CM | POA: Diagnosis present

## 2021-08-22 DIAGNOSIS — E7800 Pure hypercholesterolemia, unspecified: Secondary | ICD-10-CM | POA: Diagnosis present

## 2021-08-22 DIAGNOSIS — I639 Cerebral infarction, unspecified: Secondary | ICD-10-CM | POA: Diagnosis not present

## 2021-08-22 DIAGNOSIS — I25110 Atherosclerotic heart disease of native coronary artery with unstable angina pectoris: Secondary | ICD-10-CM | POA: Diagnosis present

## 2021-08-22 DIAGNOSIS — I63522 Cerebral infarction due to unspecified occlusion or stenosis of left anterior cerebral artery: Secondary | ICD-10-CM | POA: Diagnosis present

## 2021-08-22 DIAGNOSIS — Z89612 Acquired absence of left leg above knee: Secondary | ICD-10-CM | POA: Diagnosis not present

## 2021-08-22 DIAGNOSIS — R29710 NIHSS score 10: Secondary | ICD-10-CM | POA: Diagnosis present

## 2021-08-22 DIAGNOSIS — I6389 Other cerebral infarction: Secondary | ICD-10-CM | POA: Diagnosis not present

## 2021-08-22 DIAGNOSIS — R233 Spontaneous ecchymoses: Secondary | ICD-10-CM | POA: Diagnosis present

## 2021-08-22 DIAGNOSIS — R531 Weakness: Secondary | ICD-10-CM

## 2021-08-22 DIAGNOSIS — N390 Urinary tract infection, site not specified: Secondary | ICD-10-CM | POA: Diagnosis not present

## 2021-08-22 DIAGNOSIS — Z7984 Long term (current) use of oral hypoglycemic drugs: Secondary | ICD-10-CM | POA: Diagnosis not present

## 2021-08-22 DIAGNOSIS — I5022 Chronic systolic (congestive) heart failure: Secondary | ICD-10-CM | POA: Diagnosis not present

## 2021-08-22 DIAGNOSIS — R4701 Aphasia: Secondary | ICD-10-CM | POA: Diagnosis present

## 2021-08-22 DIAGNOSIS — R4182 Altered mental status, unspecified: Secondary | ICD-10-CM

## 2021-08-22 DIAGNOSIS — I110 Hypertensive heart disease with heart failure: Secondary | ICD-10-CM | POA: Diagnosis present

## 2021-08-22 DIAGNOSIS — Z139 Encounter for screening, unspecified: Secondary | ICD-10-CM | POA: Diagnosis not present

## 2021-08-22 DIAGNOSIS — I6340 Cerebral infarction due to embolism of unspecified cerebral artery: Secondary | ICD-10-CM | POA: Insufficient documentation

## 2021-08-22 DIAGNOSIS — R000 Tachycardia, unspecified: Secondary | ICD-10-CM | POA: Diagnosis not present

## 2021-08-22 DIAGNOSIS — E1165 Type 2 diabetes mellitus with hyperglycemia: Secondary | ICD-10-CM | POA: Diagnosis present

## 2021-08-22 DIAGNOSIS — Z9049 Acquired absence of other specified parts of digestive tract: Secondary | ICD-10-CM

## 2021-08-22 DIAGNOSIS — I252 Old myocardial infarction: Secondary | ICD-10-CM

## 2021-08-22 DIAGNOSIS — I517 Cardiomegaly: Secondary | ICD-10-CM | POA: Diagnosis not present

## 2021-08-22 DIAGNOSIS — I739 Peripheral vascular disease, unspecified: Secondary | ICD-10-CM | POA: Diagnosis not present

## 2021-08-22 DIAGNOSIS — G319 Degenerative disease of nervous system, unspecified: Secondary | ICD-10-CM | POA: Diagnosis present

## 2021-08-22 DIAGNOSIS — I63412 Cerebral infarction due to embolism of left middle cerebral artery: Secondary | ICD-10-CM | POA: Diagnosis not present

## 2021-08-22 DIAGNOSIS — Z20822 Contact with and (suspected) exposure to covid-19: Secondary | ICD-10-CM | POA: Diagnosis present

## 2021-08-22 DIAGNOSIS — Z993 Dependence on wheelchair: Secondary | ICD-10-CM

## 2021-08-22 DIAGNOSIS — I69351 Hemiplegia and hemiparesis following cerebral infarction affecting right dominant side: Secondary | ICD-10-CM | POA: Diagnosis not present

## 2021-08-22 DIAGNOSIS — I10 Essential (primary) hypertension: Secondary | ICD-10-CM | POA: Diagnosis present

## 2021-08-22 DIAGNOSIS — E1159 Type 2 diabetes mellitus with other circulatory complications: Secondary | ICD-10-CM | POA: Diagnosis not present

## 2021-08-22 DIAGNOSIS — Z79899 Other long term (current) drug therapy: Secondary | ICD-10-CM | POA: Diagnosis not present

## 2021-08-22 DIAGNOSIS — R29818 Other symptoms and signs involving the nervous system: Secondary | ICD-10-CM | POA: Diagnosis not present

## 2021-08-22 DIAGNOSIS — I509 Heart failure, unspecified: Secondary | ICD-10-CM | POA: Diagnosis not present

## 2021-08-22 DIAGNOSIS — I255 Ischemic cardiomyopathy: Secondary | ICD-10-CM | POA: Diagnosis not present

## 2021-08-22 DIAGNOSIS — I63233 Cerebral infarction due to unspecified occlusion or stenosis of bilateral carotid arteries: Secondary | ICD-10-CM | POA: Diagnosis not present

## 2021-08-22 DIAGNOSIS — Z1389 Encounter for screening for other disorder: Secondary | ICD-10-CM

## 2021-08-22 DIAGNOSIS — Z89511 Acquired absence of right leg below knee: Secondary | ICD-10-CM

## 2021-08-22 DIAGNOSIS — E876 Hypokalemia: Secondary | ICD-10-CM | POA: Diagnosis not present

## 2021-08-22 LAB — I-STAT CHEM 8, ED
BUN: 9 mg/dL (ref 8–23)
Calcium, Ion: 1.2 mmol/L (ref 1.15–1.40)
Chloride: 102 mmol/L (ref 98–111)
Creatinine, Ser: 0.9 mg/dL (ref 0.61–1.24)
Glucose, Bld: 228 mg/dL — ABNORMAL HIGH (ref 70–99)
HCT: 40 % (ref 39.0–52.0)
Hemoglobin: 13.6 g/dL (ref 13.0–17.0)
Potassium: 4.2 mmol/L (ref 3.5–5.1)
Sodium: 136 mmol/L (ref 135–145)
TCO2: 23 mmol/L (ref 22–32)

## 2021-08-22 LAB — RAPID URINE DRUG SCREEN, HOSP PERFORMED
Amphetamines: NOT DETECTED
Barbiturates: NOT DETECTED
Benzodiazepines: NOT DETECTED
Cocaine: NOT DETECTED
Opiates: NOT DETECTED
Tetrahydrocannabinol: NOT DETECTED

## 2021-08-22 LAB — DIFFERENTIAL
Abs Immature Granulocytes: 0.02 10*3/uL (ref 0.00–0.07)
Basophils Absolute: 0 10*3/uL (ref 0.0–0.1)
Basophils Relative: 0 %
Eosinophils Absolute: 0 10*3/uL (ref 0.0–0.5)
Eosinophils Relative: 1 %
Immature Granulocytes: 0 %
Lymphocytes Relative: 19 %
Lymphs Abs: 1.7 10*3/uL (ref 0.7–4.0)
Monocytes Absolute: 0.7 10*3/uL (ref 0.1–1.0)
Monocytes Relative: 8 %
Neutro Abs: 6.4 10*3/uL (ref 1.7–7.7)
Neutrophils Relative %: 72 %

## 2021-08-22 LAB — COMPREHENSIVE METABOLIC PANEL
ALT: 15 U/L (ref 0–44)
AST: 15 U/L (ref 15–41)
Albumin: 2.7 g/dL — ABNORMAL LOW (ref 3.5–5.0)
Alkaline Phosphatase: 79 U/L (ref 38–126)
Anion gap: 15 (ref 5–15)
BUN: 10 mg/dL (ref 8–23)
CO2: 20 mmol/L — ABNORMAL LOW (ref 22–32)
Calcium: 9.2 mg/dL (ref 8.9–10.3)
Chloride: 99 mmol/L (ref 98–111)
Creatinine, Ser: 1.1 mg/dL (ref 0.61–1.24)
GFR, Estimated: >60 mL/min (ref >60)
Glucose, Bld: 234 mg/dL — ABNORMAL HIGH (ref 70–99)
Potassium: 4.1 mmol/L (ref 3.5–5.1)
Sodium: 134 mmol/L — ABNORMAL LOW (ref 135–145)
Total Bilirubin: 1 mg/dL (ref 0.3–1.2)
Total Protein: 6.9 g/dL (ref 6.5–8.1)

## 2021-08-22 LAB — CBC
HCT: 39.7 % (ref 39.0–52.0)
HCT: 38.4 % — ABNORMAL LOW (ref 39.0–52.0)
Hemoglobin: 13.4 g/dL (ref 13.0–17.0)
Hemoglobin: 13 g/dL (ref 13.0–17.0)
MCH: 32.7 pg (ref 26.0–34.0)
MCH: 32.6 pg (ref 26.0–34.0)
MCHC: 33.8 g/dL (ref 30.0–36.0)
MCHC: 33.9 g/dL (ref 30.0–36.0)
MCV: 96.8 fL (ref 80.0–100.0)
MCV: 96.2 fL (ref 80.0–100.0)
Platelets: 304 10*3/uL (ref 150–400)
Platelets: 313 10*3/uL (ref 150–400)
RBC: 4.1 MIL/uL — ABNORMAL LOW (ref 4.22–5.81)
RBC: 3.99 MIL/uL — ABNORMAL LOW (ref 4.22–5.81)
RDW: 14.7 % (ref 11.5–15.5)
RDW: 14.8 % (ref 11.5–15.5)
WBC: 8.8 10*3/uL (ref 4.0–10.5)
WBC: 8.6 10*3/uL (ref 4.0–10.5)
nRBC: 0 % (ref 0.0–0.2)
nRBC: 0 % (ref 0.0–0.2)

## 2021-08-22 LAB — GLUCOSE, CAPILLARY
Glucose-Capillary: 201 mg/dL — ABNORMAL HIGH (ref 70–99)
Glucose-Capillary: 181 mg/dL — ABNORMAL HIGH (ref 70–99)

## 2021-08-22 LAB — URINALYSIS, ROUTINE W REFLEX MICROSCOPIC
Bilirubin Urine: NEGATIVE
Glucose, UA: >=500 mg/dL — AB
Ketones, ur: 20 mg/dL — AB
Leukocytes,Ua: NEGATIVE
Nitrite: NEGATIVE
Protein, ur: 30 mg/dL — AB
Specific Gravity, Urine: 1.027 (ref 1.005–1.030)
pH: 5 (ref 5.0–8.0)

## 2021-08-22 LAB — LIPID PANEL
Cholesterol: 191 mg/dL (ref 0–200)
HDL: 26 mg/dL — ABNORMAL LOW (ref >40)
LDL Cholesterol: 142 mg/dL — ABNORMAL HIGH (ref 0–99)
Total CHOL/HDL Ratio: 7.3 RATIO
Triglycerides: 117 mg/dL (ref <150)
VLDL: 23 mg/dL (ref 0–40)

## 2021-08-22 LAB — PROTIME-INR
INR: 1 (ref 0.8–1.2)
Prothrombin Time: 13.4 seconds (ref 11.4–15.2)

## 2021-08-22 LAB — CBG MONITORING, ED
Glucose-Capillary: 200 mg/dL — ABNORMAL HIGH (ref 70–99)
Glucose-Capillary: 217 mg/dL — ABNORMAL HIGH (ref 70–99)
Glucose-Capillary: 219 mg/dL — ABNORMAL HIGH (ref 70–99)
Glucose-Capillary: 231 mg/dL — ABNORMAL HIGH (ref 70–99)

## 2021-08-22 LAB — HEMOGLOBIN A1C
Hgb A1c MFr Bld: 10.4 % — ABNORMAL HIGH (ref 4.8–5.6)
Mean Plasma Glucose: 251.78 mg/dL

## 2021-08-22 LAB — RESP PANEL BY RT-PCR (FLU A&B, COVID) ARPGX2
Influenza A by PCR: NEGATIVE
Influenza B by PCR: NEGATIVE
SARS Coronavirus 2 by RT PCR: NEGATIVE

## 2021-08-22 LAB — APTT: aPTT: 30 seconds (ref 24–36)

## 2021-08-22 LAB — CREATININE, SERUM
Creatinine, Ser: 1.07 mg/dL (ref 0.61–1.24)
GFR, Estimated: >60 mL/min (ref >60)

## 2021-08-22 LAB — ETHANOL: Alcohol, Ethyl (B): <10 mg/dL (ref <10)

## 2021-08-22 LAB — HIV ANTIBODY (ROUTINE TESTING W REFLEX): HIV Screen 4th Generation wRfx: NONREACTIVE

## 2021-08-22 MED ORDER — SODIUM CHLORIDE 0.9 % IV SOLN: INTRAVENOUS | Status: AC

## 2021-08-22 MED ORDER — INSULIN ASPART 100 UNIT/ML IJ SOLN
0.0000 [IU] | INTRAMUSCULAR | Status: DC
  Administered 2021-08-22 (×3): 3 [IU] via SUBCUTANEOUS
  Administered 2021-08-22 – 2021-08-23 (×3): 2 [IU] via SUBCUTANEOUS

## 2021-08-22 MED ORDER — ACETAMINOPHEN 325 MG PO TABS
650.0000 mg | ORAL_TABLET | ORAL | Status: DC | PRN
  Filled 2021-08-22: qty 2

## 2021-08-22 MED ORDER — ASPIRIN 325 MG PO TABS: 325.0000 mg | ORAL_TABLET | Freq: Every day | ORAL | Status: DC

## 2021-08-22 MED ORDER — LORAZEPAM 0.5 MG PO TABS
0.5000 mg | ORAL_TABLET | Freq: Once | ORAL | Status: DC
Start: 2021-08-22 — End: 2021-08-29

## 2021-08-22 MED ORDER — HYDRALAZINE HCL 20 MG/ML IJ SOLN: 10.0000 mg | INTRAMUSCULAR | Status: DC | PRN

## 2021-08-22 MED ORDER — ACETAMINOPHEN 160 MG/5ML PO SOLN: 650.0000 mg | ORAL | Status: DC | PRN

## 2021-08-22 MED ORDER — LORAZEPAM 2 MG/ML IJ SOLN
0.5000 mg | Freq: Once | INTRAMUSCULAR | Status: AC
  Administered 2021-08-22: 0.5 mg via INTRAVENOUS
  Filled 2021-08-22: qty 1

## 2021-08-22 MED ORDER — ACETAMINOPHEN 650 MG RE SUPP: 650.0000 mg | RECTAL | Status: DC | PRN

## 2021-08-22 MED ORDER — ASPIRIN EC 81 MG PO TBEC
81.0000 mg | DELAYED_RELEASE_TABLET | Freq: Every day | ORAL | Status: DC
  Administered 2021-08-23 – 2021-08-26 (×5): 81 mg via ORAL
  Filled 2021-08-22 (×5): qty 1

## 2021-08-22 MED ORDER — PRAVASTATIN SODIUM 40 MG PO TABS: 80.0000 mg | ORAL_TABLET | Freq: Every day | ORAL | Status: DC

## 2021-08-22 MED ORDER — ATORVASTATIN CALCIUM 80 MG PO TABS
80.0000 mg | ORAL_TABLET | Freq: Every day | ORAL | Status: DC
  Administered 2021-08-22 – 2021-08-29 (×8): 80 mg via ORAL
  Filled 2021-08-22 (×8): qty 1

## 2021-08-22 MED ORDER — STROKE: EARLY STAGES OF RECOVERY BOOK
Freq: Once | Status: AC
  Filled 2021-08-22: qty 1

## 2021-08-22 MED ORDER — ENOXAPARIN SODIUM 40 MG/0.4ML IJ SOSY
40.0000 mg | PREFILLED_SYRINGE | INTRAMUSCULAR | Status: DC
  Administered 2021-08-22 – 2021-08-25 (×4): 40 mg via SUBCUTANEOUS
  Filled 2021-08-22 (×4): qty 0.4

## 2021-08-22 MED ORDER — CLOPIDOGREL BISULFATE 75 MG PO TABS
75.0000 mg | ORAL_TABLET | Freq: Every day | ORAL | Status: DC
  Administered 2021-08-23 – 2021-08-25 (×4): 75 mg via ORAL
  Filled 2021-08-22 (×4): qty 1

## 2021-08-22 MED ORDER — ASPIRIN 300 MG RE SUPP: 300.0000 mg | Freq: Every day | RECTAL | Status: DC

## 2021-08-22 NOTE — Evaluation (Signed)
Physical Therapy Evaluation Patient Details Name: Shawn [name] MRN: [id number] DOB: Feb 01, 1951 Today's Date: 08/22/2021   History of Present Illness  Pt is 70 yo male who presented with R sided weakness and facial droop.  He has been admitted for CVA workup on 08/22/21.  CT head negative but MRI pending.  At baseline pt with R BKA and L AKA, w/c bound, but transfers to w/c easily.  Other hx includes DM, HTN, pVD.  Clinical Impression   Pt admitted with above diagnosis. Pt with limited evaluation today due to pt unable to provide PLOF/hx (attempted to call contacts but no answer), in ED on stretcher, if pt wears prosthetics he does not have them, and need for +2 assist.  Per chart, pt lives alone, w/c bound but transfers to w/c easily.  Today, pt with limited communication -only stated his name but did follow simple commands without cues.  Presented with R sided weakness (1/5 R hand and elbow and 0/5 R shoulder and leg).  R knee with limited extension with tight endfeel - potentially could be his baseline vs increased tone.  He was Max A bed mobility and leans to the R in bed.  Unable to progress to EOB due to above listed limitations.  Pt currently with functional limitations due to the deficits listed below (see PT Problem List). Pt will benefit from skilled PT to increase their independence and safety with mobility to allow discharge to the venue listed below.       Follow Up Recommendations SNF    Equipment Recommendations  Other (comment) (needs further assessment (unsure baseline))    Recommendations for Other Services       Precautions / Restrictions Precautions Precautions: Fall      Mobility  Bed Mobility Overal bed mobility: Needs Assistance Bed Mobility: Rolling Rolling: Max assist         General bed mobility comments: Max A to roll; unable to progress further due to pt's weakness, bil LE amputations (no prosthetics in room, unsure if he wears at home) , on ED stretcher,  and will need +2 assist.    Transfers                 General transfer comment: deferred  Ambulation/Gait                Stairs            Wheelchair Mobility    Modified Rankin (Stroke Patients Only) Modified Rankin (Stroke Patients Only) Pre-Morbid Rankin Score: Moderate disability (uses w/c but independent with transfers) Modified Rankin: Severe disability     Balance Overall balance assessment: Needs assistance   Sitting balance-Leahy Scale: Zero Sitting balance - Comments: EOB sitting deferred due to need for assist of 2, but laying in bed with HOB elevated -pt leaning to R, unable to correct self                                     Pertinent Vitals/Pain Pain Assessment: No/denies pain    Home Living Family/patient expects to be discharged to:: Unsure                 Additional Comments: Pt unable to provide any history.  Called numbers in chart for family but no answers. Lives at home alone per chart.    Prior Function           Comments:  Unsure of PLOF.  Per chart pt lives alone and is w/c bound but transfers to w/c easily.  Unsure if he uses prosthetics or lateral scoots.     Hand Dominance   Dominant Hand: Right    Extremity/Trunk Assessment   Upper Extremity Assessment Upper Extremity Assessment: LUE deficits/detail;RUE deficits/detail;Difficult to assess due to impaired cognition RUE Deficits / Details: R sided deficits; ROM: tending to keep fingers flexed but able to striaghten except digit 5 ?trigger finger, elbow and shoulder WFL; MMT: hand 1/5, elbow 1/5, shoulder 0/5 LUE Deficits / Details: ROM WFL and MMT at least 3/5 but did have difficulty with MMT commands LUE Sensation: decreased light touch    Lower Extremity Assessment Lower Extremity Assessment: LLE deficits/detail;RLE deficits/detail RLE Deficits / Details: BKA; ROM: difficulty achieving full knee ext, lacking at least 20 degrees (possibly  baseline), hip WFL ROM; MMT: 0/5 throughout RLE Sensation: decreased light touch LLE Deficits / Details: AKA; ROM WFL; MMT at least 3/5    Cervical / Trunk Assessment Cervical / Trunk Assessment: Other exceptions Cervical / Trunk Exceptions: Leaning to R in bed  Communication   Communication: Expressive difficulties  Cognition Arousal/Alertness: Awake/alert Behavior During Therapy: Flat affect Overall Cognitive Status: Impaired/Different from baseline                                 General Comments: Pt admitted with AMS.  He only stated his name as "Shawn [name]".  Did not respond to yes/no questions or shake head.  He did follow 1 step commands without visual cues (lifting L arm, gave thumbs up, touched therapist hand using L arm)      General Comments General comments (skin integrity, edema, etc.): VSS    Exercises     Assessment/Plan    PT Assessment Patient needs continued PT services  PT Problem List Decreased strength;Decreased mobility;Decreased safety awareness;Decreased range of motion;Decreased coordination;Decreased activity tolerance;Decreased cognition;Decreased balance;Decreased knowledge of use of DME;Impaired sensation       PT Treatment Interventions DME instruction;Therapeutic activities;Therapeutic exercise;Patient/family education;Balance training;Wheelchair mobility training;Functional mobility training;Neuromuscular re-education    PT Goals (Current goals can be found in the Care Plan section)  Acute Rehab PT Goals Patient Stated Goal: unable PT Goal Formulation: With patient Time For Goal Achievement: 09/05/21 Potential to Achieve Goals: Fair    Frequency Min 3X/week   Barriers to discharge Decreased caregiver support      Co-evaluation               AM-PAC PT "6 Clicks" Mobility  Outcome Measure Help needed turning from your back to your side while in a flat bed without using bedrails?: Total Help needed moving from lying on  your back to sitting on the side of a flat bed without using bedrails?: Total Help needed moving to and from a bed to a chair (including a wheelchair)?: Total Help needed standing up from a chair using your arms (e.g., wheelchair or bedside chair)?: Total Help needed to walk in hospital room?: Total Help needed climbing 3-5 steps with a railing? : Total 6 Click Score: 6    End of Session Equipment Utilized During Treatment: Gait belt Activity Tolerance: Patient tolerated treatment well Patient left: in bed;with call bell/phone within reach (on ED stretcher) Nurse Communication: Mobility status PT Visit Diagnosis: Muscle weakness (generalized) (M62.81);Hemiplegia and hemiparesis Hemiplegia - Right/Left: Right Hemiplegia - dominant/non-dominant: Dominant Hemiplegia - caused by:  (suspected CVA)  Time: 1012-1023 PT Time Calculation (min) (ACUTE ONLY): 11 min   Charges:   PT Evaluation $PT Eval Low Complexity: 1 Low          Loyce Klasen, PT Acute Rehab Services Pager [phone] [facility] [place] 08/22/2021, 10:42 AM

## 2021-08-22 NOTE — ED Triage Notes (Signed)
BIB [facility] from home. Initial call out was for diabetes. CBG 256. LKW Monday. EMS reports aphasia, right sided weakness and droop. Pt had urinary incontinence. Family reported that pt has not been acting right. Hx: diabetes 20 R Hand 250cc NS  146/92 94% room air 112 heart rate

## 2021-08-22 NOTE — Progress Notes (Signed)
Inpatient Diabetes Program Recommendations  AACE/ADA: New Consensus Statement on Inpatient Glycemic Control   Target Ranges:  Prepandial:   less than 140 mg/dL      Peak postprandial:   less than 180 mg/dL (1-2 hours)      Critically ill patients:  140 - 180 mg/dL  Results for DAKODAH, GULBRANDSEN (MRN [id number]) as of 08/22/2021 12:03  Ref. Range 08/22/2021 00:34 08/22/2021 05:59 08/22/2021 08:13  Glucose-Capillary Latest Ref Range: 70 - 99 mg/dL 231 (H) 219 (H) 217 (H)   Results for TYCEN, STAIB (MRN [id number]) as of 08/22/2021 12:03  Ref. Range 08/22/2021 06:01  Hemoglobin A1C Latest Ref Range: 4.8 - 5.6 % 10.4 (H)    Review of Glycemic Control  Diabetes history: DM2 Outpatient Diabetes medications: Glipizide XL 5 mg daily, Metformin 1000 mg BID, Actos 12 mg daily, Januvia 100 mg daily Current orders for Inpatient glycemic control: Novolog 0-9 units Q4H  Inpatient Diabetes Program Recommendations:    Insulin: If glucose remains consistently over 180 mg/dl, please consider ordering Semglee 6 units Q24H (basd on 61.2 kg x 0.1 units).  HbgA1C: Current A1C 10.4% indicating an average glucose of 252 mg/dl over the past 2-3 months. Per telephone note on 07/29/21 by D. Leath, RN, patient has not been checking glucose and last A1C was 11.1% in May.   Thanks, Barnie Alderman, RN, MSN, CDE Diabetes Coordinator Inpatient Diabetes Program [phone] (Team Pager from 8am to 5pm)

## 2021-08-22 NOTE — ED Provider Notes (Signed)
[facility] EMERGENCY DEPARTMENT Provider Note  CSN: [id number] Arrival date & time: 08/22/21 0020  Chief Complaint(s) Weakness ED Triage Notes Louann Sjogren, RN (Registered Nurse)   Emergency Medicine   Date of Service: 08/22/2021 12:22 AM   Signed   BIB [facility] from home. Initial call out was for diabetes. CBG 256. LKW Monday. EMS reports aphasia, right sided weakness and droop. Pt had urinary incontinence. Family reported that pt has not been acting right. Hx: diabetes 20 R Hand 250cc NS       HPI LEEAM SANDAU is a 70 y.o. male brought in for altered mental status with right-sided deficits.  Patient lives at home alone and was reportedly found by neighbors after checking on him.  EMS noted right facial droop and right-sided deficits.  Last known normal was Monday, 3 days ago.  Remainder of history, ROS, and physical exam limited due to patient's condition (altered mental status). Additional information was obtained from EMS.   Level V Caveat.  I also spoke to patient's mother who does not live with the patient.  She reported that patient normally is wheelchair-bound but is able to transfer without difficulty.   The history is provided by the EMS personnel.   Past Medical History Past Medical History:  Diagnosis Date   Diabetes mellitus without complication ([place])    Elevated cholesterol    Patient Active Problem List   Diagnosis Date Noted   Right sided weakness 08/22/2021   Essential hypertension 08/22/2021   PAD (peripheral artery disease) ([place]) 01/08/2019   Knee injury, right, initial encounter 01/07/2019   History of amputation of right lower extremity through tibia and fibula ([place]) 08/18/2018   Type 2 diabetes mellitus with circulatory disorder, without long-term current use of insulin ([place]) 07/23/2018   Osteomyelitis of hand, left, acute ([place]) 02/18/2018   Home Medication(s) Prior to Admission medications   Medication Sig Start Date End  Date Taking? Authorizing Provider  aspirin EC 81 MG tablet Take 81 mg by mouth daily.    [provider]  DULoxetine (CYMBALTA) 60 MG capsule Take 60 mg by mouth daily.    [provider]  gabapentin (NEURONTIN) 600 MG tablet Take 600 mg by mouth 3 (three) times daily.    [provider]  glipiZIDE (GLUCOTROL XL) 5 MG 24 hr tablet Take 1 tablet by mouth daily. 11/04/19   [provider]  lisinopril (PRINIVIL,ZESTRIL) 2.5 MG tablet Take 2.5 mg by mouth daily.    [provider]  metFORMIN (GLUCOPHAGE) 1000 MG tablet Take 1 tablet by mouth 2 (two) times daily. 03/01/19   [provider]  naproxen sodium (ANAPROX) 550 MG tablet Take 550 mg by mouth 2 (two) times daily with a meal.    [provider]  pioglitazone (ACTOS) 15 MG tablet Take 15 mg by mouth daily.    [provider]  pravastatin (PRAVACHOL) 40 MG tablet Take 40 mg by mouth daily.    [provider]  sitaGLIPtin (JANUVIA) 100 MG tablet Take 100 mg by mouth daily.    [provider]  Past Surgical History Past Surgical History:  Procedure Laterality Date   AMPUTATION Left 01/07/2019   Procedure: AMPUTATION ABOVE KNEE;  Surgeon: Waynetta Sandy, MD;  Location: [place];  Service: Vascular;  Laterality: Left;   CHOLECYSTECTOMY     EYE SURGERY     INCISION AND DRAINAGE ABSCESS Left 02/03/2018   Procedure: INCISION AND DRAINAGE LEFT THUMB INFECTION;  Surgeon: Charlotte Crumb, MD;  Location: [place];  Service: Orthopedics;  Laterality: Left;   TONSILLECTOMY     Family History History reviewed. No pertinent family history.  Social History Social History   Tobacco Use   Smoking status: Never   Smokeless tobacco: Never  Vaping Use   Vaping Use: Never used  Substance Use Topics   Alcohol use:  Yes    Alcohol/week: 42.0 standard drinks    Types: 42 Cans of beer per week    Comment: "~ 6 Natural Lights a day"   Drug use: No   Allergies Patient has no known allergies.  Review of Systems Review of Systems Unable to obtain due to altered mental status Physical Exam Vital Signs  I have reviewed the triage vital signs BP 131/86   Pulse 99   Temp 98.5 F (36.9 C) (Oral)   Resp 19   Ht '5\' 9"'$  (1.753 m)   SpO2 95%   BMI 19.94 kg/m   Physical Exam Vitals reviewed.  Constitutional:      General: He is not in acute distress.    Appearance: He is well-developed. He is not diaphoretic.  HENT:     Head: Normocephalic and atraumatic.     Nose: Nose normal.  Eyes:     General: No scleral icterus.       Right eye: No discharge.        Left eye: No discharge.     Conjunctiva/sclera: Conjunctivae normal.     Pupils: Pupils are equal, round, and reactive to light.  Cardiovascular:     Rate and Rhythm: Normal rate and regular rhythm.     Heart sounds: No murmur heard.   No friction rub. No gallop.  Pulmonary:     Effort: Pulmonary effort is normal. No respiratory distress.     Breath sounds: Normal breath sounds. No stridor. No rales.  Abdominal:     General: There is no distension.     Palpations: Abdomen is soft.     Tenderness: There is no abdominal tenderness.  Genitourinary:    Penis: Uncircumcised. Erythema present.      Testes: Normal.        Right: Tenderness or swelling not present.        Left: Tenderness or swelling not present.  Musculoskeletal:        General: No tenderness.     Cervical back: Normal range of motion and neck supple.     Comments: Bilateral BKA's  Skin:    General: Skin is warm and dry.     Findings: No erythema or rash.  Neurological:     Mental Status: He is alert. He is disoriented.     Comments: Mental Status:  Alert and oriented to person and place Attention and concentration slowed Speech dysarthric  Recent memory is  impaired  Cranial Nerves:  II Visual Fields: Intact to confrontation. Visual fields intact. III, IV, VI: Pupils equal and reactive to light and near. Full eye movement without nystagmus  V Facial Sensation: Normal. No weakness of masticatory muscles  VII: No facial weakness  or asymmetry  VIII Auditory Acuity: Grossly normal  IX/X: The uvula is midline; the palate elevates symmetrically  XI: Normal sternocleidomastoid and trapezius strength  XII: The tongue is midline. No atrophy or fasciculations.   Motor System: Muscle Strength: 3+/5 RUE, 0/5 RLE strength. 5/5 to left upper and lower extremities. No pronation or drift.  Muscle Tone: Tone and muscle bulk are normal in the upper and lower extremities.  Coordination: No tremor.  Sensation: Intact to light touch. Gait: deferred     ED Results and Treatments Labs (all labs ordered are listed, but only abnormal results are displayed) Labs Reviewed  COMPREHENSIVE METABOLIC PANEL - Abnormal; Notable for the following components:      Result Value   Sodium 134 (*)    CO2 20 (*)    Glucose, Bld 234 (*)    Albumin 2.7 (*)    All other components within normal limits  CBC - Abnormal; Notable for the following components:   RBC 4.10 (*)    All other components within normal limits  CBG MONITORING, ED - Abnormal; Notable for the following components:   Glucose-Capillary 231 (*)    All other components within normal limits  I-STAT CHEM 8, ED - Abnormal; Notable for the following components:   Glucose, Bld 228 (*)    All other components within normal limits  CBG MONITORING, ED - Abnormal; Notable for the following components:   Glucose-Capillary 219 (*)    All other components within normal limits  RESP PANEL BY RT-PCR (FLU A&B, COVID) ARPGX2  ETHANOL  PROTIME-INR  APTT  DIFFERENTIAL  RAPID URINE DRUG SCREEN, HOSP PERFORMED  URINALYSIS, ROUTINE W REFLEX MICROSCOPIC  HIV ANTIBODY (ROUTINE TESTING W REFLEX)  HEMOGLOBIN A1C  CBC   CREATININE, SERUM  LIPID PANEL                                                                                                                         EKG  EKG Interpretation  Date/Time:  Thursday August 22 2021 00:38:52 EDT Ventricular Rate:  110 PR Interval:  215 QRS Duration: 75 QT Interval:  326 QTC Calculation: 441 R Axis:   87 Text Interpretation: Sinus tachycardia Ventricular premature complex Prolonged PR interval Probable anterior infarct, age indeterminate new lateral TWI Confirmed by Addison Lank [phone]) on 08/22/2021 12:55:09 AM       Radiology CT HEAD WO CONTRAST  Result Date: 08/22/2021 CLINICAL DATA:  Neuro deficit, acute, stroke suspected EXAM: CT HEAD WITHOUT CONTRAST TECHNIQUE: Contiguous axial images were obtained from the base of the skull through the vertex without intravenous contrast. COMPARISON:  None. FINDINGS: Brain: There is atrophy and chronic small vessel disease changes. No acute intracranial abnormality. Specifically, no hemorrhage, hydrocephalus, mass lesion, acute infarction, or significant intracranial injury. Vascular: No hyperdense vessel or unexpected calcification. Skull: No acute calvarial abnormality. Sinuses/Orbits: No acute findings. Mucosal thickening in the maxillary sinuses. Other: None IMPRESSION: Atrophy, chronic microvascular disease. No acute intracranial abnormality. Electronically Signed  By: Rolm Baptise M.D.   On: 08/22/2021 02:17    Pertinent labs & imaging results that were available during my care of the patient were reviewed by me and considered in my medical decision making (see MDM for details).  Medications Ordered in ED Medications  LORazepam (ATIVAN) tablet 0.5 mg (0.5 mg Oral Not Given 08/22/21 0608)  0.9 %  sodium chloride infusion ( Intravenous New Bag/Given 08/22/21 0600)  acetaminophen (TYLENOL) tablet 650 mg (has no administration in time range)    Or  acetaminophen (TYLENOL) 160 MG/5ML solution 650 mg (has no  administration in time range)    Or  acetaminophen (TYLENOL) suppository 650 mg (has no administration in time range)  enoxaparin (LOVENOX) injection 40 mg (has no administration in time range)  aspirin suppository 300 mg (has no administration in time range)    Or  aspirin tablet 325 mg (has no administration in time range)  insulin aspart (novoLOG) injection 0-9 Units (3 Units Subcutaneous Given 08/22/21 0612)  hydrALAZINE (APRESOLINE) injection 10 mg (has no administration in time range)  LORazepam (ATIVAN) injection 0.5 mg (0.5 mg Intravenous Given 08/22/21 0600)   stroke: mapping our early stages of recovery book ( Does not apply Given 08/22/21 0602)                                                                                                                                     Procedures Procedures  (including critical care time)  Medical Decision Making / ED Course I have reviewed the nursing notes for this encounter and the patient's prior records (if available in EHR or on provided paperwork).  JOSEMANUEL HENNINGSEN was evaluated in Emergency Department on 08/22/2021 for the symptoms described in the history of present illness. He was evaluated in the context of the global COVID-19 pandemic, which necessitated consideration that the patient might be at risk for infection with the SARS-CoV-2 virus that causes COVID-19. Institutional protocols and algorithms that pertain to the evaluation of patients at risk for COVID-19 are in a state of rapid change based on information released by regulatory bodies including the CDC and federal and state organizations. These policies and algorithms were followed during the patient's care in the ED.     Patient presents with altered mental status and right-sided deficits. Concern for possible stroke.  Patient does not meet tPA criteria given last known normal Will assess for any metabolic derangements or possible infectious process. Patient has balanitis on  exam, no evidence of Fournier's.  Pertinent labs & imaging results that were available during my care of the patient were reviewed by me and considered in my medical decision making:  Initial work-up was nonrevealing. CBC without leukocytosis or anemia.  Patient has hyperglycemia without evidence of DKA.  No significant electrolyte derangements or renal sufficiency.  Alcohol negative. COVID/influenza negative. UA still pending.  CT head without evidence of old stroke. Will need  to obtain MRI. Case discussed with neurology who agreed.  If it reveals a stroke they will follow. Patient admitted to medicine for further work-up and management.  Final Clinical Impression(s) / ED Diagnoses Final diagnoses:  Altered mental status, unspecified altered mental status type  Right sided weakness     This chart was dictated using voice recognition software.  Despite best efforts to proofread,  errors can occur which can change the documentation meaning.    Fatima Blank, MD 08/22/21 [phone]
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
## 2021-08-22 NOTE — Progress Notes (Signed)
PROGRESS NOTE    Shawn [name]  [id number] DOB: 1951-06-08 DOA: 08/22/2021 PCP: [name], PA-C   Brief Narrative: 70 year old with past medical history significant for diabetes type 2, hypertension, peripheral vascular disease a status post right BKA and left AKA, hyperlipidemia was found to be confused.  EMS was called and patient was found to have right side weakness and also had some right-sided facial droop.  Patient last known normal was around 3 days prior to admission.  At baseline patient is usually wheelchair-bound but is able to easily transfer from wheelchair to the bed and is usually alert and awake and oriented.  CT Head: Show atrophy, chronic microvascular disease.  No acute abnormality. Able to proceed with MRI of the brain because patient has large number of FB  metal from [facility].  Patient is felt not to be candidate for MRI due to safety.   Assessment & Plan:   Principal Problem:   Right sided weakness Active Problems:   PAD (peripheral artery disease) (HCC)   Type 2 diabetes mellitus with circulatory disorder, without long-term current use of insulin (HCC)   Essential hypertension  1-Right-sided weakness, confusion: Finding concerns with stroke -Neurology has been consulted -Unable to proceed with MRI due to large numbers of FB metal from [place] patient is not felt to be a candidate for MRI due to safety. -Speech, PT OT consulted -LDL; 142. Increase Pravachol.  --A1c; 10.4 -Check 2D echo carotid Doppler  2-Hypertension: Permissive hypertension. PRN hydralazine.   3-Diabetes type 2 with hyperglycemia: A1c: 10.  SSI.   History of hyperlipidemia: Increase statins.   Peripheral vascular disease status post right BKA and left AKA.   Estimated body mass index is 19.94 kg/m as calculated from the following:   Height as of this encounter: '5\' 9"'$  (1.753 m).   Weight as of 02/18/19: 61.2 kg.   DVT prophylaxis: Lovenox Code Status: Full code Family  Communication: None at bedside Disposition Plan:  Status is: Observation  The patient remains OBS appropriate and will d/c before 2 midnights.  Dispo: The patient is from: Home              Anticipated d/c is to:  To be determined              Patient currently is not medically stable to d/c.   Difficult to place patient No        Consultants:  Neurology   Procedures:  None  Antimicrobials:    Subjective: He is alert, very slowly answer questions. Voice weak.    Objective: Vitals:   08/22/21 0800 08/22/21 0813 08/22/21 0925 08/22/21 1249  BP: 124/85  (!) 140/100 118/80  Pulse: (!) 101  (!) 103 96  Resp: 20  (!) 30 16  Temp:  98 F (36.7 C)    TempSrc:  Oral    SpO2: 91%  93% 92%  Height:       No intake or output data in the 24 hours ending 08/22/21 1520 There were no vitals filed for this visit.  Examination:  General exam: Appears calm and comfortable  Respiratory system: Clear to auscultation. Respiratory effort normal. Cardiovascular system: S1 & S2 heard, RRR. No JVD, murmurs, rubs, gallops or clicks. No pedal edema. Gastrointestinal system: Abdomen is nondistended, soft and nontender. No organomegaly or masses felt. Normal bowel sounds heard. Central nervous system: Alert no very interactive, he was able to name pen, glasses, paper. Right side weakness.  Extremities: right BKA, left BKA.  Data Reviewed: I have personally reviewed following labs and imaging studies  CBC: Recent Labs  Lab 08/22/21 0119 08/22/21 0126 08/22/21 0601  WBC 8.8  --  8.6  NEUTROABS 6.4  --   --   HGB 13.4 13.6 13.0  HCT 39.7 40.0 38.4*  MCV 96.8  --  96.2  PLT 304  --  [id number]   Basic Metabolic Panel: Recent Labs  Lab 08/22/21 0119 08/22/21 0126 08/22/21 0601  NA 134* 136  --   K 4.1 4.2  --   CL 99 102  --   CO2 20*  --   --   GLUCOSE 234* 228*  --   BUN 10 9  --   CREATININE 1.10 0.90 1.07  CALCIUM 9.2  --   --    GFR: CrCl cannot be calculated  (Unknown ideal weight.). Liver Function Tests: Recent Labs  Lab 08/22/21 0119  AST 15  ALT 15  ALKPHOS 79  BILITOT 1.0  PROT 6.9  ALBUMIN 2.7*   No results for input(s): LIPASE, AMYLASE in the last 168 hours. No results for input(s): AMMONIA in the last 168 hours. Coagulation Profile: Recent Labs  Lab 08/22/21 0119  INR 1.0   Cardiac Enzymes: No results for input(s): CKTOTAL, CKMB, CKMBINDEX, TROPONINI in the last 168 hours. BNP (last 3 results) No results for input(s): PROBNP in the last 8760 hours. HbA1C: Recent Labs    08/22/21 0601  HGBA1C 10.4*   CBG: Recent Labs  Lab 08/22/21 0034 08/22/21 0559 08/22/21 0813  GLUCAP 231* 219* 217*   Lipid Profile: Recent Labs    08/22/21 0601  CHOL 191  HDL 26*  LDLCALC 142*  TRIG 117  CHOLHDL 7.3   Thyroid Function Tests: No results for input(s): TSH, T4TOTAL, FREET4, T3FREE, THYROIDAB in the last 72 hours. Anemia Panel: No results for input(s): VITAMINB12, FOLATE, FERRITIN, TIBC, IRON, RETICCTPCT in the last 72 hours. Sepsis Labs: No results for input(s): PROCALCITON, LATICACIDVEN in the last 168 hours.  Recent Results (from the past 240 hour(s))  Resp Panel by RT-PCR (Flu A&B, Covid) Nasopharyngeal Swab     Status: None   Collection Time: 08/22/21  1:13 AM   Specimen: Nasopharyngeal Swab; Nasopharyngeal(NP) swabs in vial transport medium  Result Value Ref Range Status   SARS Coronavirus 2 by RT PCR NEGATIVE NEGATIVE Final    Comment: (NOTE) SARS-CoV-2 target nucleic acids are NOT DETECTED.  The SARS-CoV-2 RNA is generally detectable in upper respiratory specimens during the acute phase of infection. The lowest concentration of SARS-CoV-2 viral copies this assay can detect is 138 copies/mL. A negative result does not preclude SARS-Cov-2 infection and should not be used as the sole basis for treatment or other patient management decisions. A negative result may occur with  improper specimen  collection/handling, submission of specimen other than nasopharyngeal swab, presence of viral mutation(s) within the areas targeted by this assay, and inadequate number of viral copies(<138 copies/mL). A negative result must be combined with clinical observations, patient history, and epidemiological information. The expected result is Negative.  Fact Sheet for Patients:  EntrepreneurPulse.com.au  Fact Sheet for Healthcare Providers:  IncredibleEmployment.be  This test is no t yet approved or cleared by the [place] FDA and  has been authorized for detection and/or diagnosis of SARS-CoV-2 by FDA under an Emergency Use Authorization (EUA). This EUA will remain  in effect (meaning this test can be used) for the duration of the COVID-19 declaration under Section 564(b)(1) of the Act,  21 U.S.C.section 360bbb-3(b)(1), unless the authorization is terminated  or revoked sooner.       Influenza A by PCR NEGATIVE NEGATIVE Final   Influenza B by PCR NEGATIVE NEGATIVE Final    Comment: (NOTE) The Xpert Xpress SARS-CoV-2/FLU/RSV plus assay is intended as an aid in the diagnosis of influenza from Nasopharyngeal swab specimens and should not be used as a sole basis for treatment. Nasal washings and aspirates are unacceptable for Xpert Xpress SARS-CoV-2/FLU/RSV testing.  Fact Sheet for Patients: EntrepreneurPulse.com.au  Fact Sheet for Healthcare Providers: IncredibleEmployment.be  This test is not yet approved or cleared by the [place] FDA and has been authorized for detection and/or diagnosis of SARS-CoV-2 by FDA under an Emergency Use Authorization (EUA). This EUA will remain in effect (meaning this test can be used) for the duration of the COVID-19 declaration under Section 564(b)(1) of the Act, 21 U.S.C. section 360bbb-3(b)(1), unless the authorization is terminated or revoked.  Performed at [facility], [place] [address] [place], [place] [address]          Radiology Studies: DG Chest 1 View  Result Date: 08/22/2021 CLINICAL DATA:  Rule out metallic foreign body prior to MRI. EXAM: CHEST  1 VIEW COMPARISON:  01/07/2019 FINDINGS: Remote lower left rib fractures. [place] projects over the left upper quadrant, similar to on the prior. There is also a radiopaque density of 6 mm which projects over the AP window region, similar to on the prior. Midline trachea. Cardiomegaly accentuated by AP portable technique. Superior mediastinal soft tissue fullness is likely also due to AP portable technique. No pleural effusion or pneumothorax. Lung volumes are low, accentuating the pulmonary interstitium. Suspect concurrent right greater than left interstitial and airspace disease. IMPRESSION: Cardiomegaly with low lung volumes and suspicion of interstitial and airspace disease. Favor pulmonary edema. Multifocal infection could look similar. [place] projecting over the left upper quadrant with a single nonspecific radiopaque density projecting over the AP window region. Electronically Signed   By: Abigail Miyamoto M.D.   On: 08/22/2021 11:47   DG Pelvis 1-2 Views  Result Date: 08/22/2021 CLINICAL DATA:  Screening prior to MRI EXAM: PELVIS - 1-2 VIEW COMPARISON:  None. FINDINGS: A single metallic radiopaque density seen overlying the left iliac crest. No acute osseous abnormality. Vascular calcifications. IMPRESSION: A single metallic radiopaque density is seen overlying the left iliac crest. Please refer to report of same-day abdominal radiograph for recommendations regarding MRI safety. Electronically Signed   By: Albin Felling M.D.   On: 08/22/2021 11:53   DG Abd 1 View  Result Date: 08/22/2021 CLINICAL DATA:  Screening prior to MRI EXAM: ABDOMEN - 1 VIEW COMPARISON:  Chest radiograph November 2016 FINDINGS: Normal bowel gas pattern. Numerous metallic radiodensities are seen primarily clustered  overlying the left upper abdomen. The osseous structures are unremarkable. Vascular calcifications. IMPRESSION: Numerous metallic radiodensities are seen primarily clustered overlying the left upper abdomen. In reviewing the patient's imaging history, the patient has had an MRI in August 2019. These radiodensities were present since at least November 2016 where there were seen on a chest radiograph. If the patient tolerated a previous MRI without difficulty, it is likely safe for the patient to undergo subsequent MRIs. The patient should be monitored for any pain or discomfort during the examination. Electronically Signed   By: Albin Felling M.D.   On: 08/22/2021 11:51   CT HEAD WO CONTRAST  Result Date: 08/22/2021 CLINICAL DATA:  Neuro deficit, acute, stroke suspected EXAM: CT  HEAD WITHOUT CONTRAST TECHNIQUE: Contiguous axial images were obtained from the base of the skull through the vertex without intravenous contrast. COMPARISON:  None. FINDINGS: Brain: There is atrophy and chronic small vessel disease changes. No acute intracranial abnormality. Specifically, no hemorrhage, hydrocephalus, mass lesion, acute infarction, or significant intracranial injury. Vascular: No hyperdense vessel or unexpected calcification. Skull: No acute calvarial abnormality. Sinuses/Orbits: No acute findings. Mucosal thickening in the maxillary sinuses. Other: None IMPRESSION: Atrophy, chronic microvascular disease. No acute intracranial abnormality. Electronically Signed   By: Rolm Baptise M.D.   On: 08/22/2021 02:17        Scheduled Meds:  aspirin  300 mg Rectal Daily   Or   aspirin  325 mg Oral Daily   enoxaparin (LOVENOX) injection  40 mg Subcutaneous Q24H   insulin aspart  0-9 Units Subcutaneous Q4H   LORazepam  0.5 mg Oral Once   Continuous Infusions:  sodium chloride 75 mL/hr at 08/22/21 0600     LOS: 0 days    Time spent: 35 minutes    Chivon Lepage A Ayse Mccartin, MD [facility]   If 7PM-7AM,  please contact night-coverage [email or web address]  08/22/2021, 3:20 PM

## 2021-08-22 NOTE — ED Notes (Signed)
Pt transported to CT at this time.

## 2021-08-22 NOTE — ED Notes (Signed)
Pt had noted BM and urinated on self. Pt cleaned and changed at this time with condom cath placed per providers request, placed back on monitor

## 2021-08-22 NOTE — Progress Notes (Signed)
This pt has a large number of FB metal from [place]. Per Dr. Ronnald Ramp in radiology pt is not a candidate for MRI due to safety. Ordering Dr. Darlyn Chamber.

## 2021-08-22 NOTE — H&P (Signed)
History and Physical    Shawn [name] [id number] DOB: [birth year] DOA: 08/22/2021  PCP: [name], PA-C  Patient coming from: Home.  Chief Complaint: Confusion.  Right-sided weakness.  History obtained from patient's records and ER physician.  Patient is confused.  HPI: Shawn [name] is a 70 y.o. male with known history of diabetes mellitus type 2, hypertension, peripheral vascular disease status post right BKA and left AKA hyperlipidemia was found to be confused about the patient's neighbors went to check on him.  EMS was called and patient was found to be right-sided weakness and also had some right facial droop.  Patient last known normal was around 3 days ago.  ER physician did discuss with patient's mother who states that patient is usually wheelchair-bound but is able to easily transfer from wheelchair to the bed and is usually alert and awake and oriented.  ED Course: In the ER patient appears confused but does follow commands has weakness on the right upper and lower extremity strength is around 3 x 5.  He is able to move his left upper and lower extremities without difficulty.  The patient is confused and slow to answer.  Labs show blood glucose of 234 sodium 134 COVID test negative EKG shows sinus tachycardia.  ER physician discussed with on-call neurologist who recommend MRI brain and if positive further stroke work-up.  Review of Systems: As per HPI, rest all negative.   Past Medical History:  Diagnosis Date   Diabetes mellitus without complication ([place])    Elevated cholesterol     Past Surgical History:  Procedure Laterality Date   AMPUTATION Left 01/07/2019   Procedure: AMPUTATION ABOVE KNEE;  Surgeon: Waynetta Sandy, MD;  Location: [facility];  Service: Vascular;  Laterality: Left;   CHOLECYSTECTOMY     EYE SURGERY     INCISION AND DRAINAGE ABSCESS Left 02/03/2018   Procedure: INCISION AND DRAINAGE LEFT THUMB INFECTION;  Surgeon: Charlotte Crumb, MD;   Location: [facility];  Service: Orthopedics;  Laterality: Left;   TONSILLECTOMY       reports that he has never smoked. He has never used smokeless tobacco. He reports current alcohol use of about 42.0 standard drinks per week. He reports that he does not use drugs.  No Known Allergies  History reviewed. No pertinent family history.  Prior to Admission medications   Medication Sig Start Date End Date Taking? Authorizing Provider  aspirin EC 81 MG tablet Take 81 mg by mouth daily.    [provider]  DULoxetine (CYMBALTA) 60 MG capsule Take 60 mg by mouth daily.    [provider]  gabapentin (NEURONTIN) 600 MG tablet Take 600 mg by mouth 3 (three) times daily.    [provider]  glipiZIDE (GLUCOTROL XL) 5 MG 24 hr tablet Take 1 tablet by mouth daily. 11/04/19   [provider]  lisinopril (PRINIVIL,ZESTRIL) 2.5 MG tablet Take 2.5 mg by mouth daily.    [provider]  metFORMIN (GLUCOPHAGE) 1000 MG tablet Take 1 tablet by mouth 2 (two) times daily. 03/01/19   [provider]  naproxen sodium (ANAPROX) 550 MG tablet Take 550 mg by mouth 2 (two) times daily with a meal.    [provider]  pioglitazone (ACTOS) 15 MG tablet Take 15 mg by mouth daily.    [provider]  pravastatin (PRAVACHOL) 40 MG tablet Take 40 mg by mouth daily.    [provider]  sitaGLIPtin (JANUVIA) 100  MG tablet Take 100 mg by mouth daily.    [provider]    Physical Exam: Constitutional: Moderately built and nourished. Vitals:   08/22/21 0100 08/22/21 0215 08/22/21 0230 08/22/21 0347  BP: (!) 137/93 (!) 134/97 130/90 131/86  Pulse:   (!) 101 99  Resp: (!) 23 (!) '22 16 19  '$ Temp:      TempSrc:      SpO2:   100% 95%  Height:       Eyes: Anicteric no pallor. ENMT: No discharge from the ears eyes nose and mouth. Neck: No mass felt.  No neck rigidity. Respiratory: No rhonchi or  crepitations. Cardiovascular: S1-S2 heard. Abdomen: Soft nontender bowel sound present. Musculoskeletal: No edema.  Right BKA and left AKA. Skin: No rash. Neurologic: Alert awake oriented his name appears slow to answer moving left upper and lower extremities 5 x 5 and right upper and lower extremities 4 x 5.  No obvious facial asymmetry tongue is midline. Psychiatric: Appears confused.   Labs on Admission: I have personally reviewed following labs and imaging studies  CBC: Recent Labs  Lab 08/22/21 0119 08/22/21 0126  WBC 8.8  --   NEUTROABS 6.4  --   HGB 13.4 13.6  HCT 39.7 40.0  MCV 96.8  --   PLT 304  --    Basic Metabolic Panel: Recent Labs  Lab 08/22/21 0119 08/22/21 0126  NA 134* 136  K 4.1 4.2  CL 99 102  CO2 20*  --   GLUCOSE 234* 228*  BUN 10 9  CREATININE 1.10 0.90  CALCIUM 9.2  --    GFR: CrCl cannot be calculated (Unknown ideal weight.). Liver Function Tests: Recent Labs  Lab 08/22/21 0119  AST 15  ALT 15  ALKPHOS 79  BILITOT 1.0  PROT 6.9  ALBUMIN 2.7*   No results for input(s): LIPASE, AMYLASE in the last 168 hours. No results for input(s): AMMONIA in the last 168 hours. Coagulation Profile: Recent Labs  Lab 08/22/21 0119  INR 1.0   Cardiac Enzymes: No results for input(s): CKTOTAL, CKMB, CKMBINDEX, TROPONINI in the last 168 hours. BNP (last 3 results) No results for input(s): PROBNP in the last 8760 hours. HbA1C: No results for input(s): HGBA1C in the last 72 hours. CBG: Recent Labs  Lab 08/22/21 0034  GLUCAP 231*   Lipid Profile: No results for input(s): CHOL, HDL, LDLCALC, TRIG, CHOLHDL, LDLDIRECT in the last 72 hours. Thyroid Function Tests: No results for input(s): TSH, T4TOTAL, FREET4, T3FREE, THYROIDAB in the last 72 hours. Anemia Panel: No results for input(s): VITAMINB12, FOLATE, FERRITIN, TIBC, IRON, RETICCTPCT in the last 72 hours. Urine analysis: No results found for: COLORURINE, APPEARANCEUR, LABSPEC, PHURINE,  GLUCOSEU, HGBUR, BILIRUBINUR, KETONESUR, PROTEINUR, UROBILINOGEN, NITRITE, LEUKOCYTESUR Sepsis Labs: '@LABRCNTIP'$ (procalcitonin:4,lacticidven:4) ) Recent Results (from the past 240 hour(s))  Resp Panel by RT-PCR (Flu A&B, Covid) Nasopharyngeal Swab     Status: None   Collection Time: 08/22/21  1:13 AM   Specimen: Nasopharyngeal Swab; Nasopharyngeal(NP) swabs in vial transport medium  Result Value Ref Range Status   SARS Coronavirus 2 by RT PCR NEGATIVE NEGATIVE Final    Comment: (NOTE) SARS-CoV-2 target nucleic acids are NOT DETECTED.  The SARS-CoV-2 RNA is generally detectable in upper respiratory specimens during the acute phase of infection. The lowest concentration of SARS-CoV-2 viral copies this assay can detect is 138 copies/mL. A negative result does not preclude SARS-Cov-2 infection and should not be used as the sole basis for treatment or other patient  management decisions. A negative result may occur with  improper specimen collection/handling, submission of specimen other than nasopharyngeal swab, presence of viral mutation(s) within the areas targeted by this assay, and inadequate number of viral copies(<138 copies/mL). A negative result must be combined with clinical observations, patient history, and epidemiological information. The expected result is Negative.  Fact Sheet for Patients:  EntrepreneurPulse.com.au  Fact Sheet for Healthcare Providers:  IncredibleEmployment.be  This test is no t yet approved or cleared by the [place] FDA and  has been authorized for detection and/or diagnosis of SARS-CoV-2 by FDA under an Emergency Use Authorization (EUA). This EUA will remain  in effect (meaning this test can be used) for the duration of the COVID-19 declaration under Section 564(b)(1) of the Act, 21 U.S.C.section 360bbb-3(b)(1), unless the authorization is terminated  or revoked sooner.       Influenza A by PCR NEGATIVE  NEGATIVE Final   Influenza B by PCR NEGATIVE NEGATIVE Final    Comment: (NOTE) The Xpert Xpress SARS-CoV-2/FLU/RSV plus assay is intended as an aid in the diagnosis of influenza from Nasopharyngeal swab specimens and should not be used as a sole basis for treatment. Nasal washings and aspirates are unacceptable for Xpert Xpress SARS-CoV-2/FLU/RSV testing.  Fact Sheet for Patients: EntrepreneurPulse.com.au  Fact Sheet for Healthcare Providers: IncredibleEmployment.be  This test is not yet approved or cleared by the [place] FDA and has been authorized for detection and/or diagnosis of SARS-CoV-2 by FDA under an Emergency Use Authorization (EUA). This EUA will remain in effect (meaning this test can be used) for the duration of the COVID-19 declaration under Section 564(b)(1) of the Act, 21 U.S.C. section 360bbb-3(b)(1), unless the authorization is terminated or revoked.  Performed at [facility], [place] [address] [place], [place] [address]      Radiological Exams on Admission: CT HEAD WO CONTRAST  Result Date: 08/22/2021 CLINICAL DATA:  Neuro deficit, acute, stroke suspected EXAM: CT HEAD WITHOUT CONTRAST TECHNIQUE: Contiguous axial images were obtained from the base of the skull through the vertex without intravenous contrast. COMPARISON:  None. FINDINGS: Brain: There is atrophy and chronic small vessel disease changes. No acute intracranial abnormality. Specifically, no hemorrhage, hydrocephalus, mass lesion, acute infarction, or significant intracranial injury. Vascular: No hyperdense vessel or unexpected calcification. Skull: No acute calvarial abnormality. Sinuses/Orbits: No acute findings. Mucosal thickening in the maxillary sinuses. Other: None IMPRESSION: Atrophy, chronic microvascular disease. No acute intracranial abnormality. Electronically Signed   By: Rolm Baptise M.D.   On: 08/22/2021 02:17    EKG: Independently reviewed.   Sinus tachycardia.  Assessment/Plan Principal Problem:   Right sided weakness Active Problems:   PAD (peripheral artery disease) (HCC)   Type 2 diabetes mellitus with circulatory disorder, without long-term current use of insulin (HCC)   Essential hypertension    Right-sided weakness with confusion concerning for stroke.  Last known normal was 3 days ago.  ER physician did discussed with neurologist on-call who recommended getting MRI brain in a.m. if positive further stroke work-up.  Stroke swallow is pending. History of hypertension we will keep patient n.p.o. and IV hydralazine for allowing for permissive hypertension until stroke is ruled out. Diabetes mellitus type 2 with hyperglycemia check hemoglobin A1c presently on sliding scale coverage. History of hyperlipidemia. History of peripheral vascular disease status post right BKA and left AKA.  Home medication needs to be verified.   DVT prophylaxis: Lovenox. Code Status: Full code. Family Communication: We will need to discuss with family. Disposition  Plan: Need to be determined. Consults called: ER physician discussed with neurologist. Admission status: Observation.   Rise Patience MD [facility] Pager [phone].  If 7PM-7AM, please contact night-coverage [email or web address] Password [email or web address]  08/22/2021, 5:01 AM

## 2021-08-22 NOTE — ED Notes (Addendum)
I cut the pt's food up and opened everything for him on his breakfast tray. I observed pt eat as per Dr Paulina Fusi verbal order and pt did well eating. Pt is only using left arm to eat.

## 2021-08-22 NOTE — ED Notes (Signed)
Provider at bedside

## 2021-08-22 NOTE — ED Notes (Signed)
Patient denies pain and is resting comfortably.  

## 2021-08-22 NOTE — ED Notes (Signed)
Returned from CT and provider at bedside

## 2021-08-23 ENCOUNTER — Observation Stay (HOSPITAL_COMMUNITY): Payer: Medicare HMO

## 2021-08-23 DIAGNOSIS — I63412 Cerebral infarction due to embolism of left middle cerebral artery: Secondary | ICD-10-CM

## 2021-08-23 DIAGNOSIS — R29810 Facial weakness: Secondary | ICD-10-CM | POA: Diagnosis present

## 2021-08-23 DIAGNOSIS — I110 Hypertensive heart disease with heart failure: Secondary | ICD-10-CM | POA: Diagnosis present

## 2021-08-23 DIAGNOSIS — I69351 Hemiplegia and hemiparesis following cerebral infarction affecting right dominant side: Secondary | ICD-10-CM | POA: Diagnosis not present

## 2021-08-23 DIAGNOSIS — I255 Ischemic cardiomyopathy: Secondary | ICD-10-CM | POA: Diagnosis not present

## 2021-08-23 DIAGNOSIS — R29710 NIHSS score 10: Secondary | ICD-10-CM | POA: Diagnosis present

## 2021-08-23 DIAGNOSIS — E1165 Type 2 diabetes mellitus with hyperglycemia: Secondary | ICD-10-CM | POA: Diagnosis present

## 2021-08-23 DIAGNOSIS — I6389 Other cerebral infarction: Secondary | ICD-10-CM | POA: Diagnosis not present

## 2021-08-23 DIAGNOSIS — N390 Urinary tract infection, site not specified: Secondary | ICD-10-CM | POA: Diagnosis not present

## 2021-08-23 DIAGNOSIS — U071 COVID-19: Secondary | ICD-10-CM | POA: Diagnosis not present

## 2021-08-23 DIAGNOSIS — I6340 Cerebral infarction due to embolism of unspecified cerebral artery: Secondary | ICD-10-CM | POA: Insufficient documentation

## 2021-08-23 DIAGNOSIS — E1151 Type 2 diabetes mellitus with diabetic peripheral angiopathy without gangrene: Secondary | ICD-10-CM | POA: Diagnosis present

## 2021-08-23 DIAGNOSIS — Z79899 Other long term (current) drug therapy: Secondary | ICD-10-CM | POA: Diagnosis not present

## 2021-08-23 DIAGNOSIS — Z7984 Long term (current) use of oral hypoglycemic drugs: Secondary | ICD-10-CM | POA: Diagnosis not present

## 2021-08-23 DIAGNOSIS — Z89612 Acquired absence of left leg above knee: Secondary | ICD-10-CM | POA: Diagnosis not present

## 2021-08-23 DIAGNOSIS — R531 Weakness: Secondary | ICD-10-CM | POA: Diagnosis present

## 2021-08-23 DIAGNOSIS — E7800 Pure hypercholesterolemia, unspecified: Secondary | ICD-10-CM | POA: Diagnosis present

## 2021-08-23 DIAGNOSIS — I513 Intracardiac thrombosis, not elsewhere classified: Secondary | ICD-10-CM | POA: Diagnosis present

## 2021-08-23 DIAGNOSIS — I63522 Cerebral infarction due to unspecified occlusion or stenosis of left anterior cerebral artery: Secondary | ICD-10-CM | POA: Diagnosis present

## 2021-08-23 DIAGNOSIS — R471 Dysarthria and anarthria: Secondary | ICD-10-CM | POA: Diagnosis present

## 2021-08-23 DIAGNOSIS — I63442 Cerebral infarction due to embolism of left cerebellar artery: Secondary | ICD-10-CM | POA: Diagnosis not present

## 2021-08-23 DIAGNOSIS — I5041 Acute combined systolic (congestive) and diastolic (congestive) heart failure: Secondary | ICD-10-CM | POA: Diagnosis present

## 2021-08-23 DIAGNOSIS — Z9049 Acquired absence of other specified parts of digestive tract: Secondary | ICD-10-CM | POA: Diagnosis not present

## 2021-08-23 DIAGNOSIS — E785 Hyperlipidemia, unspecified: Secondary | ICD-10-CM | POA: Diagnosis not present

## 2021-08-23 DIAGNOSIS — R233 Spontaneous ecchymoses: Secondary | ICD-10-CM | POA: Diagnosis present

## 2021-08-23 DIAGNOSIS — I5022 Chronic systolic (congestive) heart failure: Secondary | ICD-10-CM | POA: Diagnosis not present

## 2021-08-23 DIAGNOSIS — Z7982 Long term (current) use of aspirin: Secondary | ICD-10-CM | POA: Diagnosis not present

## 2021-08-23 DIAGNOSIS — Z20822 Contact with and (suspected) exposure to covid-19: Secondary | ICD-10-CM | POA: Diagnosis present

## 2021-08-23 DIAGNOSIS — I25110 Atherosclerotic heart disease of native coronary artery with unstable angina pectoris: Secondary | ICD-10-CM | POA: Diagnosis present

## 2021-08-23 DIAGNOSIS — I739 Peripheral vascular disease, unspecified: Secondary | ICD-10-CM | POA: Diagnosis not present

## 2021-08-23 DIAGNOSIS — E538 Deficiency of other specified B group vitamins: Secondary | ICD-10-CM | POA: Diagnosis present

## 2021-08-23 DIAGNOSIS — I429 Cardiomyopathy, unspecified: Secondary | ICD-10-CM | POA: Diagnosis present

## 2021-08-23 DIAGNOSIS — G319 Degenerative disease of nervous system, unspecified: Secondary | ICD-10-CM | POA: Diagnosis present

## 2021-08-23 DIAGNOSIS — R0902 Hypoxemia: Secondary | ICD-10-CM | POA: Diagnosis not present

## 2021-08-23 DIAGNOSIS — R4701 Aphasia: Secondary | ICD-10-CM | POA: Diagnosis present

## 2021-08-23 DIAGNOSIS — I10 Essential (primary) hypertension: Secondary | ICD-10-CM | POA: Diagnosis not present

## 2021-08-23 LAB — ECHOCARDIOGRAM COMPLETE
AR max vel: 1.75 cm2
AV Area VTI: 1.74 cm2
AV Area mean vel: 1.63 cm2
AV Mean grad: 3 mmHg
AV Peak grad: 5.3 mmHg
Ao pk vel: 1.15 m/s
Area-P 1/2: 7.59 cm2
Height: 69 in
MV VTI: 1.19 cm2
S' Lateral: 4.3 cm

## 2021-08-23 LAB — TSH: TSH: 2.536 u[IU]/mL (ref 0.350–4.500)

## 2021-08-23 LAB — CBC
HCT: 39.8 % (ref 39.0–52.0)
Hemoglobin: 13 g/dL (ref 13.0–17.0)
MCH: 31.9 pg (ref 26.0–34.0)
MCHC: 32.7 g/dL (ref 30.0–36.0)
MCV: 97.5 fL (ref 80.0–100.0)
Platelets: 318 10*3/uL (ref 150–400)
RBC: 4.08 MIL/uL — ABNORMAL LOW (ref 4.22–5.81)
RDW: 14.9 % (ref 11.5–15.5)
WBC: 7.6 10*3/uL (ref 4.0–10.5)
nRBC: 0 % (ref 0.0–0.2)

## 2021-08-23 LAB — GLUCOSE, CAPILLARY
Glucose-Capillary: 127 mg/dL — ABNORMAL HIGH (ref 70–99)
Glucose-Capillary: 138 mg/dL — ABNORMAL HIGH (ref 70–99)
Glucose-Capillary: 139 mg/dL — ABNORMAL HIGH (ref 70–99)
Glucose-Capillary: 143 mg/dL — ABNORMAL HIGH (ref 70–99)
Glucose-Capillary: 152 mg/dL — ABNORMAL HIGH (ref 70–99)
Glucose-Capillary: 154 mg/dL — ABNORMAL HIGH (ref 70–99)
Glucose-Capillary: 157 mg/dL — ABNORMAL HIGH (ref 70–99)

## 2021-08-23 LAB — BASIC METABOLIC PANEL
Anion gap: 8 (ref 5–15)
BUN: 9 mg/dL (ref 8–23)
CO2: 23 mmol/L (ref 22–32)
Calcium: 8.8 mg/dL — ABNORMAL LOW (ref 8.9–10.3)
Chloride: 106 mmol/L (ref 98–111)
Creatinine, Ser: 0.85 mg/dL (ref 0.61–1.24)
GFR, Estimated: >60 mL/min (ref >60)
Glucose, Bld: 149 mg/dL — ABNORMAL HIGH (ref 70–99)
Potassium: 3.6 mmol/L (ref 3.5–5.1)
Sodium: 137 mmol/L (ref 135–145)

## 2021-08-23 LAB — TROPONIN I (HIGH SENSITIVITY)
Troponin I (High Sensitivity): 184 ng/L (ref <18)
Troponin I (High Sensitivity): 189 ng/L (ref <18)

## 2021-08-23 LAB — RPR: RPR Ser Ql: NONREACTIVE

## 2021-08-23 LAB — VITAMIN B12: Vitamin B-12: 93 pg/mL — ABNORMAL LOW (ref 180–914)

## 2021-08-23 LAB — AMMONIA: Ammonia: 19 umol/L (ref 9–35)

## 2021-08-23 MED ORDER — INSULIN ASPART 100 UNIT/ML IJ SOLN
0.0000 [IU] | INTRAMUSCULAR | Status: DC
  Administered 2021-08-23: 1 [IU] via SUBCUTANEOUS
  Administered 2021-08-23: 2 [IU] via SUBCUTANEOUS
  Administered 2021-08-23 – 2021-08-24 (×3): 1 [IU] via SUBCUTANEOUS
  Administered 2021-08-24: 2 [IU] via SUBCUTANEOUS
  Administered 2021-08-24: 1 [IU] via SUBCUTANEOUS
  Administered 2021-08-24: 2 [IU] via SUBCUTANEOUS
  Administered 2021-08-24: 3 [IU] via SUBCUTANEOUS
  Administered 2021-08-25: 1 [IU] via SUBCUTANEOUS
  Administered 2021-08-25: 2 [IU] via SUBCUTANEOUS
  Administered 2021-08-25: 1 [IU] via SUBCUTANEOUS
  Administered 2021-08-25 (×2): 2 [IU] via SUBCUTANEOUS
  Administered 2021-08-25 – 2021-08-26 (×4): 1 [IU] via SUBCUTANEOUS
  Administered 2021-08-26: 2 [IU] via SUBCUTANEOUS
  Administered 2021-08-26 – 2021-08-27 (×5): 1 [IU] via SUBCUTANEOUS
  Administered 2021-08-27: 2 [IU] via SUBCUTANEOUS
  Administered 2021-08-28: 1 [IU] via SUBCUTANEOUS
  Administered 2021-08-28: 2 [IU] via SUBCUTANEOUS
  Administered 2021-08-28 – 2021-08-29 (×3): 1 [IU] via SUBCUTANEOUS

## 2021-08-23 MED ORDER — PERFLUTREN LIPID MICROSPHERE
1.0000 mL | INTRAVENOUS | Status: AC | PRN
  Administered 2021-08-23: 2 mL via INTRAVENOUS
  Filled 2021-08-23: qty 10

## 2021-08-23 MED ORDER — CYANOCOBALAMIN 1000 MCG/ML IJ SOLN
1000.0000 ug | Freq: Every day | INTRAMUSCULAR | Status: AC
  Administered 2021-08-23 – 2021-08-29 (×7): 1000 ug via INTRAMUSCULAR
  Filled 2021-08-23 (×7): qty 1

## 2021-08-23 NOTE — Progress Notes (Signed)
Inpatient Diabetes Program Recommendations  AACE/ADA: New Consensus Statement on Inpatient Glycemic Control (2015)  Target Ranges:  Prepandial:   less than 140 mg/dL      Peak postprandial:   less than 180 mg/dL (1-2 hours)      Critically ill patients:  140 - 180 mg/dL   Lab Results  Component Value Date   GLUCAP 152 (H) 08/23/2021   HGBA1C 10.4 (H) 08/22/2021    Review of Glycemic Control Results for Shawn [name], Shawn [name] (MRN [id number]) as of 08/23/2021 14:50  Ref. Range 08/23/2021 03:01 08/23/2021 06:39 08/23/2021 08:19 08/23/2021 12:44  Glucose-Capillary Latest Ref Range: 70 - 99 mg/dL 157 (H) 154 (H) 138 (H) 152 (H)   Diabetes history: Type 2 DM Outpatient Diabetes medications: Glipizide 5 mg QD, Metformin 1000 mg BID, Actos 12 mg QD, Januvia 100 mg QD Current orders for Inpatient glycemic control: Novolog 0-9 units Q4H  Inpatient Diabetes Program Recommendations:    Spoke with patient briefly. Patient was nonverbal during conversation, however was shaking head appropriately when asked questions. Had not been taking outpatient medications.  Reviewed patient's current A1c of 10.4%. Explained what a A1c is and what it measures. Also reviewed goal A1c with patient, importance of good glucose control @ home, and blood sugar goals.  Patient plans to go to SNF and has no questions at this time.  Thanks, Bronson Curb, MSN, RNC-OB Diabetes Coordinator [phone] (8a-5p)
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
## 2021-08-23 NOTE — Plan of Care (Signed)
  Problem: Education: Goal: Knowledge of General Education information will improve Description: Including pain rating scale, medication(s)/side effects and non-pharmacologic comfort measures Outcome: Progressing   Problem: Health Behavior/Discharge Planning: Goal: Ability to manage health-related needs will improve Outcome: Progressing   Problem: Clinical Measurements: Goal: Ability to maintain clinical measurements within normal limits will improve Outcome: Progressing Goal: Will remain free from infection Outcome: Progressing Goal: Diagnostic test results will improve Outcome: Progressing Goal: Respiratory complications will improve Outcome: Progressing Goal: Cardiovascular complication will be avoided Outcome: Progressing   Problem: Activity: Goal: Risk for activity intolerance will decrease Outcome: Progressing   Problem: Nutrition: Goal: Adequate nutrition will be maintained Outcome: Progressing   Problem: Coping: Goal: Level of anxiety will decrease Outcome: Progressing   Problem: Elimination: Goal: Will not experience complications related to bowel motility Outcome: Progressing Goal: Will not experience complications related to urinary retention Outcome: Progressing   Problem: Pain Managment: Goal: General experience of comfort will improve Outcome: Progressing   Problem: Safety: Goal: Ability to remain free from injury will improve Outcome: Progressing   Problem: Skin Integrity: Goal: Risk for impaired skin integrity will decrease Outcome: Progressing   Problem: Education: Goal: Knowledge of disease or condition will improve Outcome: Progressing Goal: Knowledge of secondary prevention will improve Outcome: Progressing Goal: Knowledge of patient specific risk factors addressed and post discharge goals established will improve Outcome: Progressing Goal: Individualized Educational Video(s) Outcome: Progressing   Problem: Health Behavior/Discharge  Planning: Goal: Ability to manage health-related needs will improve Outcome: Progressing   Problem: Self-Care: Goal: Ability to participate in self-care as condition permits will improve Outcome: Progressing Goal: Ability to communicate needs accurately will improve Outcome: Progressing   Problem: Nutrition: Goal: Risk of aspiration will decrease Outcome: Progressing   Problem: Ischemic Stroke/TIA Tissue Perfusion: Goal: Complications of ischemic stroke/TIA will be minimized Outcome: Progressing

## 2021-08-23 NOTE — Progress Notes (Signed)
PROGRESS NOTE    Shawn [name]  [id number] DOB: [birth year] DOA: 08/22/2021 PCP: [name], PA-C   Brief Narrative: 70 year old with past medical history significant for diabetes type 2, hypertension, peripheral vascular disease a status post right BKA and left AKA, hyperlipidemia was found to be confused.  EMS was called and patient was found to have right side weakness and also had some right-sided facial droop.  Patient last known normal was around 3 days prior to admission.  At baseline patient is usually wheelchair-bound but is able to easily transfer from wheelchair to the bed and is usually alert and awake and oriented.  CT Head: Show atrophy, chronic microvascular disease.  No acute abnormality. Able to proceed with MRI of the brain because patient has large number of FB  metal from [place].  Patient is felt not to be candidate for MRI due to safety.   Assessment & Plan:   Principal Problem:   Right sided weakness Active Problems:   PAD (peripheral artery disease) (HCC)   Type 2 diabetes mellitus with circulatory disorder, without long-term current use of insulin (HCC)   Essential hypertension  1-Right-sided weakness, confusion: Finding concerns with stroke -Neurology has been consulted -Unable to proceed with MRI due to large numbers of FB metal from [place] patient is not felt to be a candidate for MRI due to safety. -Speech, PT OT consulted, needs SNF.  -LDL; 142. Now on Lipitor.  --A1c; 10.4 -2D echo: Ef [id number], Grade 2 Diastolic Dysfunction, Global Hypokinesis.  - Carotid Doppler -On aspirin and plavix.   2-Hypertension: Permissive hypertension. PRN hydralazine.   3-Diabetes type 2 with hyperglycemia: A1c: 10.  SSI.  Needs Better controlled.    History of hyperlipidemia: INow on Lipitor,  New cardiomyopathy, Systolic and Diastolic Heart failure.  Cardiology consulted.  ECHO Ef [id number], grade 2 Diastolic Dysfunction.  B12 Deficiency;  Start B 12 supplement.    Peripheral vascular disease status post right BKA and left AKA.   Estimated body mass index is 19.94 kg/m as calculated from the following:   Height as of this encounter: '5\' 9"'$  (1.753 m).   Weight as of 02/18/19: 61.2 kg.   DVT prophylaxis: Lovenox Code Status: Full code Family Communication: Mother over phone Disposition Plan:  Status is: Observation  The patient remains OBS appropriate and will d/c before 2 midnights.  Dispo: The patient is from: Home              Anticipated d/c is to:  To be determined              Patient currently is not medically stable to d/c.   Difficult to place patient No        Consultants:  Neurology   Procedures:  None  Antimicrobials:    Subjective: He answer questions.  He denies pain. Feels some improvement right upper arm weakness, same LE.   Objective: Vitals:   08/22/21 2016 08/22/21 2324 08/23/21 0408 08/23/21 0815  BP: (!) 151/103 (!) 141/98 133/90 (!) 134/92  Pulse: (!) 107 100 93 95  Resp: '18 17 16 15  '$ Temp: 98.7 F (37.1 C) 98.2 F (36.8 C) 97.9 F (36.6 C) 98.6 F (37 C)  TempSrc: Oral Oral Oral Oral  SpO2: 98% 94% 96% 94%  Height:        Intake/Output Summary (Last 24 hours) at 08/23/2021 1210 Last data filed at 08/23/2021 1040 Gross per 24 hour  Intake 1009.84 ml  Output 600 ml  Net 409.84  ml   There were no vitals filed for this visit.  Examination:  General exam: NAD Respiratory system: CTA Cardiovascular system: S 1, S 2 RRR Gastrointestinal system: BS present, soft, nt Central nervous system: Right LE weakness, right arm weakness,  Extremities: right BKA, left BKA.    Data Reviewed: I have personally reviewed following labs and imaging studies  CBC: Recent Labs  Lab 08/22/21 0119 08/22/21 0126 08/22/21 0601 08/23/21 0745  WBC 8.8  --  8.6 7.6  NEUTROABS 6.4  --   --   --   HGB 13.4 13.6 13.0 13.0  HCT 39.7 40.0 38.4* 39.8  MCV 96.8  --  96.2 97.5  PLT 304  --  313 318    Basic  Metabolic Panel: Recent Labs  Lab 08/22/21 0119 08/22/21 0126 08/22/21 0601 08/23/21 0745  NA 134* 136  --  137  K 4.1 4.2  --  3.6  CL 99 102  --  106  CO2 20*  --   --  23  GLUCOSE 234* 228*  --  149*  BUN 10 9  --  9  CREATININE 1.10 0.90 1.07 0.85  CALCIUM 9.2  --   --  8.8*    GFR: CrCl cannot be calculated (Unknown ideal weight.). Liver Function Tests: Recent Labs  Lab 08/22/21 0119  AST 15  ALT 15  ALKPHOS 79  BILITOT 1.0  PROT 6.9  ALBUMIN 2.7*    No results for input(s): LIPASE, AMYLASE in the last 168 hours. Recent Labs  Lab 08/23/21 0104  AMMONIA 19   Coagulation Profile: Recent Labs  Lab 08/22/21 0119  INR 1.0    Cardiac Enzymes: No results for input(s): CKTOTAL, CKMB, CKMBINDEX, TROPONINI in the last 168 hours. BNP (last 3 results) No results for input(s): PROBNP in the last 8760 hours. HbA1C: Recent Labs    08/22/21 0601  HGBA1C 10.4*    CBG: Recent Labs  Lab 08/22/21 1718 08/22/21 2227 08/23/21 0301 08/23/21 0639 08/23/21 0819  GLUCAP 201* 181* 157* 154* 138*    Lipid Profile: Recent Labs    08/22/21 0601  CHOL 191  HDL 26*  LDLCALC 142*  TRIG 117  CHOLHDL 7.3    Thyroid Function Tests: Recent Labs    08/23/21 0104  TSH 2.536   Anemia Panel: Recent Labs    08/23/21 0104  VITAMINB12 93*   Sepsis Labs: No results for input(s): PROCALCITON, LATICACIDVEN in the last 168 hours.  Recent Results (from the past 240 hour(s))  Resp Panel by RT-PCR (Flu A&B, Covid) Nasopharyngeal Swab     Status: None   Collection Time: 08/22/21  1:13 AM   Specimen: Nasopharyngeal Swab; Nasopharyngeal(NP) swabs in vial transport medium  Result Value Ref Range Status   SARS Coronavirus 2 by RT PCR NEGATIVE NEGATIVE Final    Comment: (NOTE) SARS-CoV-2 target nucleic acids are NOT DETECTED.  The SARS-CoV-2 RNA is generally detectable in upper respiratory specimens during the acute phase of infection. The lowest concentration of  SARS-CoV-2 viral copies this assay can detect is 138 copies/mL. A negative result does not preclude SARS-Cov-2 infection and should not be used as the sole basis for treatment or other patient management decisions. A negative result may occur with  improper specimen collection/handling, submission of specimen other than nasopharyngeal swab, presence of viral mutation(s) within the areas targeted by this assay, and inadequate number of viral copies(<138 copies/mL). A negative result must be combined with clinical observations, patient history, and epidemiological information.  The expected result is Negative.  Fact Sheet for Patients:  EntrepreneurPulse.com.au  Fact Sheet for Healthcare Providers:  IncredibleEmployment.be  This test is no t yet approved or cleared by the [place] FDA and  has been authorized for detection and/or diagnosis of SARS-CoV-2 by FDA under an Emergency Use Authorization (EUA). This EUA will remain  in effect (meaning this test can be used) for the duration of the COVID-19 declaration under Section 564(b)(1) of the Act, 21 U.S.C.section 360bbb-3(b)(1), unless the authorization is terminated  or revoked sooner.       Influenza A by PCR NEGATIVE NEGATIVE Final   Influenza B by PCR NEGATIVE NEGATIVE Final    Comment: (NOTE) The Xpert Xpress SARS-CoV-2/FLU/RSV plus assay is intended as an aid in the diagnosis of influenza from Nasopharyngeal swab specimens and should not be used as a sole basis for treatment. Nasal washings and aspirates are unacceptable for Xpert Xpress SARS-CoV-2/FLU/RSV testing.  Fact Sheet for Patients: EntrepreneurPulse.com.au  Fact Sheet for Healthcare Providers: IncredibleEmployment.be  This test is not yet approved or cleared by the [place] FDA and has been authorized for detection and/or diagnosis of SARS-CoV-2 by FDA under an Emergency Use  Authorization (EUA). This EUA will remain in effect (meaning this test can be used) for the duration of the COVID-19 declaration under Section 564(b)(1) of the Act, 21 U.S.C. section 360bbb-3(b)(1), unless the authorization is terminated or revoked.  Performed at [facility], [place] [address] [place],  [address]           Radiology Studies: DG Chest 1 View  Result Date: 08/22/2021 CLINICAL DATA:  Rule out metallic foreign body prior to MRI. EXAM: CHEST  1 VIEW COMPARISON:  01/07/2019 FINDINGS: Remote lower left rib fractures. [place] projects over the left upper quadrant, similar to on the prior. There is also a radiopaque density of 6 mm which projects over the AP window region, similar to on the prior. Midline trachea. Cardiomegaly accentuated by AP portable technique. Superior mediastinal soft tissue fullness is likely also due to AP portable technique. No pleural effusion or pneumothorax. Lung volumes are low, accentuating the pulmonary interstitium. Suspect concurrent right greater than left interstitial and airspace disease. IMPRESSION: Cardiomegaly with low lung volumes and suspicion of interstitial and airspace disease. Favor pulmonary edema. Multifocal infection could look similar. [place]hot projecting over the left upper quadrant with a single nonspecific radiopaque density projecting over the AP window region. Electronically Signed   By: Abigail Miyamoto M.D.   On: 08/22/2021 11:47   DG Pelvis 1-2 Views  Result Date: 08/22/2021 CLINICAL DATA:  Screening prior to MRI EXAM: PELVIS - 1-2 VIEW COMPARISON:  None. FINDINGS: A single metallic radiopaque density seen overlying the left iliac crest. No acute osseous abnormality. Vascular calcifications. IMPRESSION: A single metallic radiopaque density is seen overlying the left iliac crest. Please refer to report of same-day abdominal radiograph for recommendations regarding MRI safety. Electronically Signed   By: Albin Felling M.D.    On: 08/22/2021 11:53   DG Abd 1 View  Result Date: 08/22/2021 CLINICAL DATA:  Screening prior to MRI EXAM: ABDOMEN - 1 VIEW COMPARISON:  Chest radiograph November 2016 FINDINGS: Normal bowel gas pattern. Numerous metallic radiodensities are seen primarily clustered overlying the left upper abdomen. The osseous structures are unremarkable. Vascular calcifications. IMPRESSION: Numerous metallic radiodensities are seen primarily clustered overlying the left upper abdomen. In reviewing the patient's imaging history, the patient has had an MRI in August 2019. These radiodensities were  present since at least November 2016 where there were seen on a chest radiograph. If the patient tolerated a previous MRI without difficulty, it is likely safe for the patient to undergo subsequent MRIs. The patient should be monitored for any pain or discomfort during the examination. Electronically Signed   By: Albin Felling M.D.   On: 08/22/2021 11:51   CT HEAD WO CONTRAST  Result Date: 08/22/2021 CLINICAL DATA:  Neuro deficit, acute, stroke suspected EXAM: CT HEAD WITHOUT CONTRAST TECHNIQUE: Contiguous axial images were obtained from the base of the skull through the vertex without intravenous contrast. COMPARISON:  None. FINDINGS: Brain: There is atrophy and chronic small vessel disease changes. No acute intracranial abnormality. Specifically, no hemorrhage, hydrocephalus, mass lesion, acute infarction, or significant intracranial injury. Vascular: No hyperdense vessel or unexpected calcification. Skull: No acute calvarial abnormality. Sinuses/Orbits: No acute findings. Mucosal thickening in the maxillary sinuses. Other: None IMPRESSION: Atrophy, chronic microvascular disease. No acute intracranial abnormality. Electronically Signed   By: Rolm Baptise M.D.   On: 08/22/2021 02:17   ECHOCARDIOGRAM COMPLETE  Result Date: 08/23/2021    ECHOCARDIOGRAM REPORT   Patient Name:   Shawn [name] Date of Exam: 08/23/2021 Medical Rec #:   [id number]      Height:       69.0 in Accession #:    [id number]     Weight:       135.0 lb Date of Birth:  Oct 19, 1951      BSA:          1.748 m Patient Age:    63 years       BP:           133/90 mmHg Patient Gender: M              HR:           92 bpm. Exam Location:  Inpatient Procedure: 2D Echo, Cardiac Doppler, Color Doppler and Intracardiac            Opacification Agent  Results communicated to Dr Tyrell Antonio at 11:27. Indications:    CVA  History:        Patient has no prior history of Echocardiogram examinations.                 Risk Factors:Diabetes and Dyslipidemia.  Sonographer:    Luisa Hart RDCS Referring Phys: [id number] Shantanique Hodo A Ryleeann Urquiza  Sonographer Comments: Patient is morbidly obese, suboptimal parasternal window, suboptimal apical window and suboptimal subcostal window. Image acquisition challenging due to patient body habitus. IMPRESSIONS  1. Left ventricular ejection fraction, by estimation, is 25 to 30%. The left ventricle has severely decreased function. The left ventricle demonstrates global hypokinesis with apical akinesis. There is moderate asymmetric left ventricular hypertrophy of  the basal-septal segment. Left ventricular diastolic parameters are consistent with Grade II diastolic dysfunction (pseudonormalization). Elevated left atrial pressure.  2. Right ventricular systolic function is normal. The right ventricular size is normal. Tricuspid regurgitation signal is inadequate for assessing PA pressure.  3. Left atrial size was mildly dilated.  4. The mitral valve is abnormal. No evidence of mitral valve regurgitation. Mild mitral stenosis. MG 77mHg at 88bpm, MVA 1.2 cm^2 by continuity equation. Moderate mitral annular calcification.  5. The aortic valve is calcified. There is moderate calcification of the aortic valve. Aortic valve regurgitation is not visualized. Mild to moderate aortic valve sclerosis/calcification is present, without any evidence of aortic stenosis. FINDINGS  Left  Ventricle: Left ventricular ejection fraction, by estimation,  is 25 to 30%. The left ventricle has severely decreased function. The left ventricle demonstrates global hypokinesis. Definity contrast agent was given IV to delineate the left ventricular endocardial borders. The left ventricular internal cavity size was normal in size. There is moderate asymmetric left ventricular hypertrophy of the basal-septal segment. Left ventricular diastolic parameters are consistent with Grade II diastolic dysfunction (pseudonormalization). Elevated left atrial pressure. Right Ventricle: The right ventricular size is normal. No increase in right ventricular wall thickness. Right ventricular systolic function is normal. Tricuspid regurgitation signal is inadequate for assessing PA pressure. Left Atrium: Left atrial size was mildly dilated. Right Atrium: Right atrial size was normal in size. Pericardium: Trivial pericardial effusion is present. Mitral Valve: The mitral valve is abnormal. Moderate mitral annular calcification. No evidence of mitral valve regurgitation. Mild mitral valve stenosis. MV peak gradient, 6.2 mmHg. The mean mitral valve gradient is 3.0 mmHg. Tricuspid Valve: The tricuspid valve is normal in structure. Tricuspid valve regurgitation is trivial. Aortic Valve: The aortic valve is calcified. There is moderate calcification of the aortic valve. Aortic valve regurgitation is not visualized. Mild to moderate aortic valve sclerosis/calcification is present, without any evidence of aortic stenosis. Aortic valve mean gradient measures 3.0 mmHg. Aortic valve peak gradient measures 5.3 mmHg. Aortic valve area, by VTI measures 1.74 cm. Pulmonic Valve: The pulmonic valve was not well visualized. Pulmonic valve regurgitation is not visualized. Aorta: The aortic root and ascending aorta are structurally normal, with no evidence of dilitation. IAS/Shunts: The interatrial septum was not well visualized.  LEFT VENTRICLE PLAX  2D LVIDd:         4.60 cm  Diastology LVIDs:         4.30 cm  LV e' medial:    3.12 cm/s LV PW:         1.30 cm  LV E/e' medial:  31.6 LV IVS:        1.80 cm  LV e' lateral:   3.94 cm/s LVOT diam:     2.40 cm  LV E/e' lateral: 25.0 LV SV:         34 LV SV Index:   19 LVOT Area:     4.52 cm  RIGHT VENTRICLE RV Basal diam:  2.80 cm RV Mid diam:    1.90 cm RV S prime:     11.50 cm/s TAPSE (M-mode): 1.4 cm LEFT ATRIUM             Index       RIGHT ATRIUM           Index LA Vol (A2C):   60.3 ml 34.49 ml/m RA Area:     10.00 cm LA Vol (A4C):   60.6 ml 34.66 ml/m RA Volume:   22.20 ml  12.70 ml/m LA Biplane Vol: 63.1 ml 36.09 ml/m  AORTIC VALVE                   PULMONIC VALVE AV Area (Vmax):    1.75 cm    PV Vmax:       0.78 m/s AV Area (Vmean):   1.63 cm    PV Vmean:      64.100 cm/s AV Area (VTI):     1.74 cm    PV VTI:        0.149 m AV Vmax:           115.00 cm/s PV Peak grad:  2.4 mmHg AV Vmean:  78.200 cm/s PV Mean grad:  2.0 mmHg AV VTI:            0.195 m AV Peak Grad:      5.3 mmHg AV Mean Grad:      3.0 mmHg LVOT Vmax:         44.60 cm/s LVOT Vmean:        28.100 cm/s LVOT VTI:          0.075 m LVOT/AV VTI ratio: 0.38  AORTA Ao Root diam: 3.60 cm Ao Asc diam:  3.20 cm MITRAL VALVE MV Area (PHT): 7.59 cm     SHUNTS MV Area VTI:   1.19 cm     Systemic VTI:  0.07 m MV Peak grad:  6.2 mmHg     Systemic Diam: 2.40 cm MV Mean grad:  3.0 mmHg MV Vmax:       1.25 m/s MV Vmean:      76.4 cm/s MV Decel Time: 100 msec MV E velocity: 98.50 cm/s MV A velocity: 119.00 cm/s MV E/A ratio:  0.83 Oswaldo Milian MD Electronically signed by Oswaldo Milian MD Signature Date/Time: 08/23/2021/11:30:45 AM    Final         Scheduled Meds:  aspirin EC  81 mg Oral Daily   atorvastatin  80 mg Oral Daily   clopidogrel  75 mg Oral Daily   cyanocobalamin  1,000 mcg Intramuscular Daily   enoxaparin (LOVENOX) injection  40 mg Subcutaneous Q24H   insulin aspart  0-9 Units Subcutaneous Q4H   LORazepam   0.5 mg Oral Once   Continuous Infusions:     LOS: 0 days    Time spent: 35 minutes    Lakendria Nicastro A Mead Slane, MD [facility]   If 7PM-7AM, please contact night-coverage [email or web address]  08/23/2021, 12:10 PM

## 2021-08-23 NOTE — Progress Notes (Signed)
Lab called critical troponin of 184, attending MD notified, Cardiology paged.
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
## 2021-08-23 NOTE — Progress Notes (Signed)
Occupational Therapy Treatment Patient Details Name: Shawn [name] MRN: [id number] DOB: 1951-07-07 Today's Date: 08/23/2021    History of present illness Pt is 70 yo male who presented with R sided weakness and facial droop.  He has been admitted for CVA workup on 08/22/21.  CT head negative but MRI pending.  At baseline pt with R BKA and L AKA, w/c bound, but transfers to w/c easily.  Other hx includes DM, HTN, pVD.   OT comments  This 70 yo male admitted with above seen for a second session today to get him back to bed due to not having maxi move double amputee sling for nursing staff to use to get patient back to bed. Pt more fatigued than when seen earlier so used +3 for back to bed. He will continue to benefit from acute OT with follow up at SNF.  Follow Up Recommendations  SNF;Supervision/Assistance - 24 hour    Equipment Recommendations  Other (comment) (TBD next venue)          Precautions / Restrictions Precautions Precautions: Fall Restrictions Weight Bearing Restrictions: No       Mobility Bed Mobility Overal bed mobility: Needs Assistance Bed Mobility: Sit to Supine   Sit to supine: Mod assist       Transfers Overall transfer level: Needs assistance Equipment used:  (bed pad) Transfers: Lateral/Scoot Transfers          Lateral/Scoot Transfers: Total assist;+2 physical assistance General transfer comment: Use of bed pad to scoot pt from drop arm recliner to bed with pt going to his left (but having issues with dynamic balance with posterior bias)    Balance Overall balance assessment: Needs assistance Sitting-balance support: Bilateral upper extremity supported Sitting balance-Leahy Scale: Poor Sitting balance - Comments: Bil UE support with tendency for posterior lean (more with dynamic than static)                                       Cognition Arousal/Alertness: Awake/alert Behavior During Therapy: Flat affect Overall Cognitive  Status: Impaired/Different from baseline Area of Impairment: Following commands;Safety/judgement;Problem solving                       Following Commands: Follows one step commands with increased time Safety/Judgement: Decreased awareness of safety;Decreased awareness of deficits Problem Solving: Slow processing;Decreased initiation;Difficulty sequencing;Requires verbal cues;Requires tactile cues                     Pertinent Vitals/ Pain       Pain Assessment: No/denies pain  Home Living Family/patient expects to be discharged to:: Skilled nursing facility                                            Frequency  Min 2X/week        Progress Toward Goals  OT Goals(current goals can now be found in the care plan section)  Progress towards OT goals: Not progressing toward goals - comment (weak overall and fatigued from sitting up)  Acute Rehab OT Goals Patient Stated Goal: wanted to get back to bed OT Goal Formulation: With patient Time For Goal Achievement: 09/06/21 Potential to Achieve Goals: Good  Plan Discharge plan remains appropriate    Co-evaluation  PT/OT/SLP Co-Evaluation/Treatment: Yes Reason for Co-Treatment: Necessary to address cognition/behavior during functional activity;For patient/therapist safety PT goals addressed during session: Mobility/safety with mobility;Balance;Strengthening/ROM OT goals addressed during session: Strengthening/ROM;ADL's and self-care      AM-PAC OT "6 Clicks" Daily Activity     Outcome Measure   Help from another person eating meals?: A Little Help from another person taking care of personal grooming?: A Lot Help from another person toileting, which includes using toliet, bedpan, or urinal?: Total Help from another person bathing (including washing, rinsing, drying)?: A Lot Help from another person to put on and taking off regular upper body clothing?: A Lot Help from another person to put on  and taking off regular lower body clothing?: Total 6 Click Score: 11    End of Session    OT Visit Diagnosis: Other abnormalities of gait and mobility (R26.89);Muscle weakness (generalized) (M62.81);Other symptoms and signs involving cognitive function   Activity Tolerance Patient tolerated treatment well   Patient Left in bed;with call bell/phone within reach (NT --putting on new condom cath)   Nurse Communication  (RN and NT assisted me with getting him back to bdd)        Time: 1257-1305 OT Time Calculation (min): 8 min  Charges: OT General Charges $OT Visit: 1 Visit OT Evaluation $OT Eval Moderate Complexity: 1 Mod OT Treatments $Therapeutic Activity: 8-22 mins  [address], OTR/L Acute [employer] Pager [phone] Office [phone]     Almon Register 08/23/2021, 1:18 PM

## 2021-08-23 NOTE — Evaluation (Signed)
Occupational Therapy Evaluation Patient Details Name: Shawn [name] MRN: [id number] DOB: 1951/02/03 Today's Date: 08/23/2021    History of Present Illness Pt is 70 yo male who presented with R sided weakness and facial droop.  He has been admitted for CVA workup on 08/22/21.  CT head negative but MRI pending.  At baseline pt with R BKA and L AKA, w/c bound, but transfers to w/c easily.  Other hx includes DM, HTN, pVD.   Clinical Impression   This 70 yo male admitted with above presents to acute OT with PLOF per his report of being totally independent/Mod I with all basic ADLs from a motorized wheelchair level and able to do transfers Mod I without prothesis. He currently is Mod-total A for basic ADLs and has limited use of RUE. He will continue to benefit from acute OT with follow up at SNF.    Follow Up Recommendations  SNF;Supervision/Assistance - 24 hour    Equipment Recommendations  Other (comment) (TBD next venue)           Precautions / Restrictions Precautions Precautions: Fall Restrictions Weight Bearing Restrictions: No      Mobility Bed Mobility Overal bed mobility: Needs Assistance Bed Mobility: Supine to Sit Rolling: Max assist   Supine to sit: Max assist;+2 for physical assistance (flat bed using rails to pull self up into long sitting)     General bed mobility comments: VCs for sequencing    Transfers Overall transfer level: Needs assistance   Transfers: Lateral/Scoot Transfers          Lateral/Scoot Transfers: Total assist;+2 physical assistance General transfer comment: use of bed pad to scoot pt to drop arm recliner with pt trying to A with UEs (but having issues with dynamic balance with posterior bias)    Balance Overall balance assessment: Needs assistance Sitting-balance support: Bilateral upper extremity supported Sitting balance-Leahy Scale: Poor Sitting balance - Comments: Bil UE support with tendency for posterior lean (more with dynamic  than static)                                   ADL either performed or assessed with clinical judgement   ADL Overall ADL's : Needs assistance/impaired Eating/Feeding: Moderate assistance Eating/Feeding Details (indicate cue type and reason): supported sitting Grooming: Wash/dry face;Minimal assistance Grooming Details (indicate cue type and reason): supported sitting (used both hands) Upper Body Bathing: Moderate assistance Upper Body Bathing Details (indicate cue type and reason): supported sitting Lower Body Bathing: Total assistance;Bed level   Upper Body Dressing : Maximal assistance Upper Body Dressing Details (indicate cue type and reason): supported sitting Lower Body Dressing: Total assistance;Bed level   Toilet Transfer: Total assistance;+2 for physical assistance Toilet Transfer Details (indicate cue type and reason): lateral scoot to drop arm recliner going to his left Toileting- Clothing Manipulation and Hygiene: Total assistance;Bed level                Pertinent Vitals/Pain Pain Assessment: No/denies pain     Hand Dominance Right   Extremity/Trunk Assessment Upper Extremity Assessment Upper Extremity Assessment: LUE deficits/detail RUE Deficits / Details: can only actively raise his arm ~30 degrees (supine and sitting), rest 3/5 with decreased coordination, 5th digit contractured LUE Deficits / Details: 5/5 MMT           Communication Communication Communication: Expressive difficulties (hard to understand at times, soft spoken as well)   Cognition Arousal/Alertness: Awake/alert  Behavior During Therapy: Flat affect Overall Cognitive Status: Impaired/Different from baseline Area of Impairment: Following commands;Safety/judgement;Awareness;Problem solving                       Following Commands: Follows one step commands inconsistently;Follows one step commands with increased time Safety/Judgement: Decreased awareness of  safety;Decreased awareness of deficits Awareness: Intellectual Problem Solving: Slow processing;Decreased initiation;Difficulty sequencing;Requires verbal cues;Requires tactile cues                Home Living Family/patient expects to be discharged to:: Skilled nursing facility                                        Prior Functioning/Environment Level of Independence: Independent with assistive device(s)        Comments: Per pt he uses power wheel chair and this is the way he gets to appointments (he can drive electric wheelchair), mother gets his groceries, he reports he is Mod I with all basic ADLs and transfers, has RLE prothestic but does not use it        OT Problem List: Decreased strength;Decreased range of motion;Impaired balance (sitting and/or standing);Impaired UE functional use      OT Treatment/Interventions: Self-care/ADL training;DME and/or AE instruction;Patient/family education;Balance training;Therapeutic exercise;Therapeutic activities;Neuromuscular education    OT Goals(Current goals can be found in the care plan section) Acute Rehab OT Goals Patient Stated Goal: agreeable to get up to recliner OT Goal Formulation: With patient Time For Goal Achievement: 09/06/21 Potential to Achieve Goals: Good  OT Frequency: Min 2X/week               Co-evaluation PT/OT/SLP Co-Evaluation/Treatment: Yes Reason for Co-Treatment: Necessary to address cognition/behavior during functional activity;For patient/therapist safety PT goals addressed during session: Mobility/safety with mobility;Balance;Strengthening/ROM OT goals addressed during session: Strengthening/ROM;ADL's and self-care      AM-PAC OT "6 Clicks" Daily Activity     Outcome Measure Help from another person eating meals?: A Little Help from another person taking care of personal grooming?: A Lot Help from another person toileting, which includes using toliet, bedpan, or urinal?:  Total Help from another person bathing (including washing, rinsing, drying)?: A Lot Help from another person to put on and taking off regular upper body clothing?: A Lot Help from another person to put on and taking off regular lower body clothing?: Total 6 Click Score: 11   End of Session Nurse Communication: Mobility status (NT: we will come back and get pt back to bed due to still looking for a Bil LE amputee sling)  Activity Tolerance: Patient tolerated treatment well Patient left: in chair;with call bell/phone within reach;with chair alarm set  OT Visit Diagnosis: Other abnormalities of gait and mobility (R26.89);Muscle weakness (generalized) (M62.81);Other symptoms and signs involving cognitive function                Time: 1004-1036 OT Time Calculation (min): 32 min Charges:  OT General Charges $OT Visit: 1 Visit OT Evaluation $OT Eval Moderate Complexity: 1 Mod  Golden Circle, OTR/L Acute Rehab Services Pager [phone] Office [phone]    Almon Register 08/23/2021, 12:01 PM

## 2021-08-23 NOTE — Care Management Obs Status (Signed)
[facility] NOTIFICATION   Patient Details  Name: Shawn [name] MRN: [id number] Date of Birth: Dec 31, 1950   Medicare Observation Status Notification Given:  Yes    Pollie Friar, RN 08/23/2021, 1:43 PM

## 2021-08-23 NOTE — Evaluation (Signed)
Speech Language Pathology Evaluation Patient Details Name: Shawn [name] MRN: [id number] DOB: 01-13-1951 Today's Date: 08/23/2021 Time: [id number] SLP Time Calculation (min) (ACUTE ONLY): 18 min  Problem List:  Patient Active Problem List   Diagnosis Date Noted   Cerebral embolism with cerebral infarction 08/23/2021   Right sided weakness 08/22/2021   Essential hypertension 08/22/2021   PAD (peripheral artery disease) ([facility]) 01/08/2019   Knee injury, right, initial encounter 01/07/2019   History of amputation of right lower extremity through tibia and fibula ([facility]) 08/18/2018   Type 2 diabetes mellitus with circulatory disorder, without long-term current use of insulin ([facility]) 07/23/2018   Osteomyelitis of hand, left, acute ([facility]) 02/18/2018   Past Medical History:  Past Medical History:  Diagnosis Date   Diabetes mellitus without complication ([facility])    Elevated cholesterol    Past Surgical History:  Past Surgical History:  Procedure Laterality Date   AMPUTATION Left 01/07/2019   Procedure: AMPUTATION ABOVE KNEE;  Surgeon: Waynetta Sandy, MD;  Location: [facility];  Service: Vascular;  Laterality: Left;   CHOLECYSTECTOMY     EYE SURGERY     INCISION AND DRAINAGE ABSCESS Left 02/03/2018   Procedure: INCISION AND DRAINAGE LEFT THUMB INFECTION;  Surgeon: Charlotte Crumb, MD;  Location: [facility];  Service: Orthopedics;  Laterality: Left;   TONSILLECTOMY     HPI:  Pt is 70 yo male who presented with confusion, R sided weakness and facial droop.  He has been admitted for CVA workup on 08/22/21.  CT head (08/22/21) negative for acute findings. Pt is not felt to be a candidate for MRI due to safety. At baseline, pt is wheelchair-bound but is able to easily transfer from wheelchair to bed and is usually alert, awake and oriented. PMH: DM, HTN, pVD a status post right BKA and left AKA, hyperlipidemia   Assessment / Plan / Recommendation Clinical Impression  Pt presents with  dysarthria and cognitive communication deficits, CVA work up ongoing. Speech intelligibility is reduced at the sentence level and improved with short phrase utterances using overarticulation. Expressive language appeared [facility] with question of receptive dificulties as pt demonstrated reduced ability to answer mod-complex y/n questions and follow commands consistently. Decreased attention vs participation may have played a role in this presentation, however further diagnostic testing needs to be completed along with review of imaging as work up unfolds. In terms of cognitive function, he is inconsistently oriented to time and situation. Poor recall memory also noted during structured tasks. SLP services to f/u to address motor speech and cognitive functions with ongoing diagnostic treatment for language function.    SLP Assessment  SLP Recommendation/Assessment: Patient needs continued Speech Lanaguage Pathology Services SLP Visit Diagnosis: Dysarthria and anarthria (R47.1);Cognitive communication deficit (R41.841)    Follow Up Recommendations  Skilled Nursing facility;24 hour supervision/assistance    Frequency and Duration min 2x/week  2 weeks      SLP Evaluation Cognition  Overall Cognitive Status: Impaired/Different from baseline Arousal/Alertness: Awake/alert Orientation Level: Oriented to person;Disoriented to place;Oriented to time;Disoriented to situation Year: 2022 Month: September Day of Week: Correct Memory: Impaired Awareness: Impaired       Comprehension  Auditory Comprehension Overall Auditory Comprehension: Impaired Yes/No Questions: Impaired Basic Immediate Environment Questions: 50-74% accurate Complex Questions: 0-24% accurate Commands: Within Functional Limits Visual Recognition/Discrimination Discrimination: Not tested Reading Comprehension Reading Status: Unable to assess (comment)    Expression Expression Primary Mode of Expression: Verbal Verbal  Expression Overall Verbal Expression: Appears within functional limits for tasks  assessed Initiation: No impairment Level of Generative/Spontaneous Verbalization: Conversation Naming: No impairment Written Expression Written Expression: Not tested   Oral / Motor  Oral Motor/Sensory Function Overall Oral Motor/Sensory Function: Mild impairment Motor Speech Overall Motor Speech: Impaired Respiration: Within functional limits Phonation: Normal Articulation: Impaired Level of Impairment: Sentence Intelligibility: Intelligibility reduced Word: 75-100% accurate Phrase: 50-74% accurate Sentence: 50-74% accurate   GO                   Ellwood Dense, MA, [facility] Acute Rehabilitation Services Office Number: [phone]  Acie Fredrickson 08/23/2021, 4:13 PM

## 2021-08-23 NOTE — TOC Initial Note (Signed)
Transition of Care [facility]) - Initial/Assessment Note    Patient Details  Name: Shawn [name] MRN: [id number] Date of Birth: May 19, 1951  [facility] CM/SW Contact:    Baldemar Lenis, LCSW Phone Number: 08/23/2021, 11:01 AM  Clinical Narrative:      CSW met with patient to discuss recommendation for SNF placement. Patient was nonverbal during discussion, but shook his head or nodded appropriately to questions asked. Patient initially shook his head to refuse SNF, but acknowledged that he does not have help at home. CSW asked patient to think about it and see how he does over the next day or so, and he nodded that he would think about it. Noting that PT and OT are assigned to work with patient today, will follow for updates and continue to discuss recommendations with patient. CSW to follow.             Expected Discharge Plan: Skilled Nursing Facility Barriers to Discharge: Continued Medical Work up, English as a second language teacher   Patient Goals and CMS Choice Patient states their goals for this hospitalization and ongoing recovery are:: patient did not respond verbally to question, unable to assess CMS Medicare.gov Compare Post Acute Care list provided to:: Patient Choice offered to / list presented to : Patient  Expected Discharge Plan and Services Expected Discharge Plan: Skilled Nursing Facility     Post Acute Care Choice: NA Living arrangements for the past 2 months: Apartment                                      Prior Living Arrangements/Services Living arrangements for the past 2 months: Apartment Lives with:: Self Patient language and need for interpreter reviewed:: No Do you feel safe going back to the place where you live?: Yes      Need for Family Participation in Patient Care: No (Comment) Care giver support system in place?: No (comment) Current home services: DME Criminal Activity/Legal Involvement Pertinent to Current Situation/Hospitalization:  No - Comment as needed  Activities of Daily Living      Permission Sought/Granted                  Emotional Assessment Appearance:: Appears stated age Attitude/Demeanor/Rapport: Engaged Affect (typically observed): Appropriate Orientation: : Oriented to Self, Oriented to Place, Oriented to  Time, Oriented to Situation Alcohol / Substance Use: Not Applicable Psych Involvement: No (comment)  Admission diagnosis:  Right sided weakness [R53.1] Encounter for imaging to screen for metal prior to magnetic resonance imaging (MRI) [Z13.89] Altered mental status, unspecified altered mental status type [R41.82] Patient Active Problem List   Diagnosis Date Noted   Right sided weakness 08/22/2021   Essential hypertension 08/22/2021   PAD (peripheral artery disease) ([facility]) 01/08/2019   Knee injury, right, initial encounter 01/07/2019   History of amputation of right lower extremity through tibia and fibula ([facility]) 08/18/2018   Type 2 diabetes mellitus with circulatory disorder, without long-term current use of insulin ([facility]) 07/23/2018   Osteomyelitis of hand, left, acute ([facility]) 02/18/2018   PCP:  Lonie Peak, PA-C Pharmacy:   [id number] - [place], [place] - [address] AT [facility] [address] [place] [address] Phone: [phone] Fax: [phone]  [id number] [place], [place] - [address] AT [facility] [address] [place] [address] Phone: [phone] Fax: [phone]  Social Determinants of Health (SDOH) Interventions    Readmission Risk Interventions No flowsheet data found.   
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
## 2021-08-23 NOTE — Progress Notes (Signed)
TEE set up for Monday at 8:30 by Dr Aundra Dubin.   Shawn [name] 2:04 PM

## 2021-08-23 NOTE — Progress Notes (Signed)
Patient moved to 3W room 21, message left on voicemail for Patient's wife June Bayne at number listed in chart

## 2021-08-24 ENCOUNTER — Inpatient Hospital Stay (HOSPITAL_COMMUNITY): Payer: Medicare HMO

## 2021-08-24 DIAGNOSIS — R531 Weakness: Secondary | ICD-10-CM | POA: Diagnosis not present

## 2021-08-24 DIAGNOSIS — I63412 Cerebral infarction due to embolism of left middle cerebral artery: Secondary | ICD-10-CM | POA: Diagnosis not present

## 2021-08-24 LAB — CBC
HCT: 40.6 % (ref 39.0–52.0)
Hemoglobin: 13.3 g/dL (ref 13.0–17.0)
MCH: 32.1 pg (ref 26.0–34.0)
MCHC: 32.8 g/dL (ref 30.0–36.0)
MCV: 98.1 fL (ref 80.0–100.0)
Platelets: 312 10*3/uL (ref 150–400)
RBC: 4.14 MIL/uL — ABNORMAL LOW (ref 4.22–5.81)
RDW: 14.9 % (ref 11.5–15.5)
WBC: 8.3 10*3/uL (ref 4.0–10.5)
nRBC: 0 % (ref 0.0–0.2)

## 2021-08-24 LAB — BASIC METABOLIC PANEL
Anion gap: 7 (ref 5–15)
BUN: 10 mg/dL (ref 8–23)
CO2: 21 mmol/L — ABNORMAL LOW (ref 22–32)
Calcium: 9 mg/dL (ref 8.9–10.3)
Chloride: 108 mmol/L (ref 98–111)
Creatinine, Ser: 1 mg/dL (ref 0.61–1.24)
GFR, Estimated: >60 mL/min (ref >60)
Glucose, Bld: 172 mg/dL — ABNORMAL HIGH (ref 70–99)
Potassium: 3.6 mmol/L (ref 3.5–5.1)
Sodium: 136 mmol/L (ref 135–145)

## 2021-08-24 LAB — GLUCOSE, CAPILLARY
Glucose-Capillary: 134 mg/dL — ABNORMAL HIGH (ref 70–99)
Glucose-Capillary: 131 mg/dL — ABNORMAL HIGH (ref 70–99)
Glucose-Capillary: 222 mg/dL — ABNORMAL HIGH (ref 70–99)
Glucose-Capillary: 178 mg/dL — ABNORMAL HIGH (ref 70–99)
Glucose-Capillary: 185 mg/dL — ABNORMAL HIGH (ref 70–99)

## 2021-08-24 MED ORDER — INSULIN GLARGINE-YFGN 100 UNIT/ML ~~LOC~~ SOLN
10.0000 [IU] | Freq: Every day | SUBCUTANEOUS | Status: DC
  Administered 2021-08-24 – 2021-08-29 (×6): 10 [IU] via SUBCUTANEOUS
  Filled 2021-08-24 (×7): qty 0.1

## 2021-08-24 MED ORDER — IOHEXOL 350 MG/ML SOLN
75.0000 mL | Freq: Once | INTRAVENOUS | Status: AC | PRN
  Administered 2021-08-24: 75 mL via INTRAVENOUS

## 2021-08-24 MED ORDER — SPIRONOLACTONE 12.5 MG HALF TABLET
12.5000 mg | ORAL_TABLET | Freq: Every day | ORAL | Status: DC
  Administered 2021-08-24 – 2021-08-28 (×5): 12.5 mg via ORAL
  Filled 2021-08-24 (×6): qty 1

## 2021-08-24 NOTE — Progress Notes (Addendum)
PROGRESS NOTE    Shawn [name]  [id number] DOB: 24-Mar-1951 DOA: 08/22/2021 PCP: [name], PA-C   Brief Narrative: 70 year old with past medical history significant for diabetes type 2, hypertension, peripheral vascular disease a status post right BKA and left AKA, hyperlipidemia was found to be confused.  EMS was called and patient was found to have right side weakness and also had some right-sided facial droop.  Patient last known normal was around 3 days prior to admission.  At baseline patient is usually wheelchair-bound but is able to easily transfer from wheelchair to the bed and is usually alert and awake and oriented.  CT Head: Show atrophy, chronic microvascular disease.  No acute abnormality. Able to proceed with MRI of the brain because patient has large number of FB  metal from [place].  Patient is felt not to be candidate for MRI due to safety.   Assessment & Plan:   Principal Problem:   Right sided weakness Active Problems:   PAD (peripheral artery disease) (HCC)   Type 2 diabetes mellitus with circulatory disorder, without long-term current use of insulin (HCC)   Essential hypertension   Cerebral embolism with cerebral infarction  1-Acute left ACA distribution Infarct involving the parasagittal left frontal Lobe.  Right-sided weakness, confusion: -Neurology has been consulted -Unable to proceed with MRI due to large numbers of FB metal from [place] patient is not felt to be a candidate for MRI due to safety. -Speech, PT OT consulted, needs SNF.  -LDL; 142. Now on Lipitor.  --A1c; 10.4 -2D echo: Ef [id number], Grade 2 Diastolic Dysfunction, Global Hypokinesis.  - CT head neck: Evolving acute left ACA distribution infarct involving the parasagittal left frontal lobe. Suspected faint petechial blood products without intraparenchymal hematoma or mass effect. Negative CTA for large vessel occlusion. Short-segment 50% stenosis at the origin of the cervical right ICA -On aspirin and  plavix.   2-Hypertension: Permissive hypertension. PRN hydralazine.   3-Diabetes type 2 with hyperglycemia: A1c: 10.  SSI.  Needs Better controlled.  Start Semglee insulin.   History of hyperlipidemia: Now on Lipitor,  New cardiomyopathy, Systolic and Diastolic Heart failure.  Cardiology consulted. Appreciate assistance.  Patient will need ischemic evaluation at some point.  Plan to start spironolactone today.  TEE on Monday  ECHO Ef [id number], grade 2 Diastolic Dysfunction.   B12 Deficiency; at 93. Started  B 12 supplement.   Peripheral vascular disease status post right BKA and left AKA.   Estimated body mass index is 19.94 kg/m as calculated from the following:   Height as of this encounter: '5\' 9"'$  (1.753 m).   Weight as of 02/18/19: 61.2 kg.   DVT prophylaxis: Lovenox Code Status: Full code Family Communication: Mother over phone 9/08---Daughter updated (9/10)  [phone] Disposition Plan:  Status is: Observation  The patient remains OBS appropriate and will d/c before 2 midnights.  Dispo: The patient is from: Home              Anticipated d/c is to:  To be determined              Patient currently is not medically stable to d/c.   Difficult to place patient No        Consultants:  Neurology   Procedures:  None  Antimicrobials:    Subjective: He feels better, appears more interactive.   Objective: Vitals:   08/24/21 0107 08/24/21 0527 08/24/21 0749 08/24/21 0837  BP: 120/85 (!) 142/95 (!) 138/96 137/87  Pulse: 94 (!)  101 96 99  Resp: '16 16 14 15  '$ Temp: 98.5 F (36.9 C) 98.6 F (37 C) 98.5 F (36.9 C) 98.4 F (36.9 C)  TempSrc:  Oral Oral Oral  SpO2: 99% 94% 94% 95%  Height:        Intake/Output Summary (Last 24 hours) at 08/24/2021 1227 Last data filed at 08/24/2021 0331 Gross per 24 hour  Intake --  Output 400 ml  Net -400 ml    There were no vitals filed for this visit.  Examination:  General exam: NAD Respiratory  system:CTA Cardiovascular system: S 1. S 2 RRR Gastrointestinal system:  BS preset, NT, ND Central nervous system: Alert, talking more, right side weakness Extremities: Right BKA, Left AKA   Data Reviewed: I have personally reviewed following labs and imaging studies  CBC: Recent Labs  Lab 08/22/21 0119 08/22/21 0126 08/22/21 0601 08/23/21 0745 08/24/21 0840  WBC 8.8  --  8.6 7.6 8.3  NEUTROABS 6.4  --   --   --   --   HGB 13.4 13.6 13.0 13.0 13.3  HCT 39.7 40.0 38.4* 39.8 40.6  MCV 96.8  --  96.2 97.5 98.1  PLT 304  --  313 318 [id number]    Basic Metabolic Panel: Recent Labs  Lab 08/22/21 0119 08/22/21 0126 08/22/21 0601 08/23/21 0745 08/24/21 0840  NA 134* 136  --  137 136  K 4.1 4.2  --  3.6 3.6  CL 99 102  --  106 108  CO2 20*  --   --  23 21*  GLUCOSE 234* 228*  --  149* 172*  BUN 10 9  --  9 10  CREATININE 1.10 0.90 1.07 0.85 1.00  CALCIUM 9.2  --   --  8.8* 9.0    GFR: CrCl cannot be calculated (Unknown ideal weight.). Liver Function Tests: Recent Labs  Lab 08/22/21 0119  AST 15  ALT 15  ALKPHOS 79  BILITOT 1.0  PROT 6.9  ALBUMIN 2.7*    No results for input(s): LIPASE, AMYLASE in the last 168 hours. Recent Labs  Lab 08/23/21 0104  AMMONIA 19    Coagulation Profile: Recent Labs  Lab 08/22/21 0119  INR 1.0    Cardiac Enzymes: No results for input(s): CKTOTAL, CKMB, CKMBINDEX, TROPONINI in the last 168 hours. BNP (last 3 results) No results for input(s): PROBNP in the last 8760 hours. HbA1C: Recent Labs    08/22/21 0601  HGBA1C 10.4*    CBG: Recent Labs  Lab 08/23/21 2117 08/23/21 2333 08/24/21 0329 08/24/21 0746 08/24/21 1200  GLUCAP 143* 139* 134* 131* 222*    Lipid Profile: Recent Labs    08/22/21 0601  CHOL 191  HDL 26*  LDLCALC 142*  TRIG 117  CHOLHDL 7.3    Thyroid Function Tests: Recent Labs    08/23/21 0104  TSH 2.536    Anemia Panel: Recent Labs    08/23/21 0104  VITAMINB12 93*    Sepsis  Labs: No results for input(s): PROCALCITON, LATICACIDVEN in the last 168 hours.  Recent Results (from the past 240 hour(s))  Resp Panel by RT-PCR (Flu A&B, Covid) Nasopharyngeal Swab     Status: None   Collection Time: 08/22/21  1:13 AM   Specimen: Nasopharyngeal Swab; Nasopharyngeal(NP) swabs in vial transport medium  Result Value Ref Range Status   SARS Coronavirus 2 by RT PCR NEGATIVE NEGATIVE Final    Comment: (NOTE) SARS-CoV-2 target nucleic acids are NOT DETECTED.  The SARS-CoV-2 RNA is generally  detectable in upper respiratory specimens during the acute phase of infection. The lowest concentration of SARS-CoV-2 viral copies this assay can detect is 138 copies/mL. A negative result does not preclude SARS-Cov-2 infection and should not be used as the sole basis for treatment or other patient management decisions. A negative result may occur with  improper specimen collection/handling, submission of specimen other than nasopharyngeal swab, presence of viral mutation(s) within the areas targeted by this assay, and inadequate number of viral copies(<138 copies/mL). A negative result must be combined with clinical observations, patient history, and epidemiological information. The expected result is Negative.  Fact Sheet for Patients:  EntrepreneurPulse.com.au  Fact Sheet for Healthcare Providers:  IncredibleEmployment.be  This test is no t yet approved or cleared by the [place] FDA and  has been authorized for detection and/or diagnosis of SARS-CoV-2 by FDA under an Emergency Use Authorization (EUA). This EUA will remain  in effect (meaning this test can be used) for the duration of the COVID-19 declaration under Section 564(b)(1) of the Act, 21 U.S.C.section 360bbb-3(b)(1), unless the authorization is terminated  or revoked sooner.       Influenza A by PCR NEGATIVE NEGATIVE Final   Influenza B by PCR NEGATIVE NEGATIVE Final     Comment: (NOTE) The Xpert Xpress SARS-CoV-2/FLU/RSV plus assay is intended as an aid in the diagnosis of influenza from Nasopharyngeal swab specimens and should not be used as a sole basis for treatment. Nasal washings and aspirates are unacceptable for Xpert Xpress SARS-CoV-2/FLU/RSV testing.  Fact Sheet for Patients: EntrepreneurPulse.com.au  Fact Sheet for Healthcare Providers: IncredibleEmployment.be  This test is not yet approved or cleared by the [place] FDA and has been authorized for detection and/or diagnosis of SARS-CoV-2 by FDA under an Emergency Use Authorization (EUA). This EUA will remain in effect (meaning this test can be used) for the duration of the COVID-19 declaration under Section 564(b)(1) of the Act, 21 U.S.C. section 360bbb-3(b)(1), unless the authorization is terminated or revoked.  Performed at [facility], [place] [address] [place], [place] [address]           Radiology Studies: CT ANGIO HEAD NECK W WO CM  Result Date: 08/24/2021 CLINICAL DATA:  Follow-up examination for stroke. EXAM: CT ANGIOGRAPHY HEAD AND NECK TECHNIQUE: Multidetector CT imaging of the head and neck was performed using the standard protocol during bolus administration of intravenous contrast. Multiplanar CT image reconstructions and MIPs were obtained to evaluate the vascular anatomy. Carotid stenosis measurements (when applicable) are obtained utilizing NASCET criteria, using the distal internal carotid diameter as the denominator. CONTRAST:  37m OMNIPAQUE IOHEXOL 350 MG/ML SOLN COMPARISON:  CT from 08/22/2021. FINDINGS: CT HEAD FINDINGS Brain: Atrophy with chronic microvascular ischemic disease. Parenchymal hypodensity seen involving the parasagittal left frontal lobe consistent with an evolving left ACA distribution infarct (series 5, image 24). This is slightly increased in conspicuity as compared to previous. Area of infarction  measures approximately 4.1 cm in AP diameter. Suspected faint petechial blood products without intraparenchymal hematoma or significant mass effect. No other acute intracranial hemorrhage or large vessel territory infarct. No mass lesion or midline shift. No hydrocephalus or extra-axial fluid collection. Vascular: No hyperdense vessel. Calcified atherosclerosis at the skull base. Skull: Scalp soft tissues and calvarium are unchanged with no new acute abnormality. Sinuses: Chronic right maxillary sinusitis. Small chronic appearing left mastoid effusion noted as well. Orbits: Globes and orbital soft tissues demonstrate no acute finding. Review of the MIP images confirms the above findings  CTA NECK FINDINGS Aortic arch: Visualized aortic arch normal in caliber with normal branch pattern. Mild atheromatous change about the arch itself. No stenosis about the origin of the great vessels. Right carotid system: Right CCA widely patent to the bifurcation without stenosis. Eccentric calcified plaque at the proximal right ICA with associated stenosis of up to 50% by NASCET criteria. Right ICA patent distally without stenosis or other abnormality. Left carotid system: Left CCA patent from its origin to the bifurcation without stenosis. Eccentric calcified plaque at the origin of the left ICA without hemodynamically significant stenosis. Left ICA patent distally without stenosis or other abnormality. Vertebral arteries: Both vertebral arteries arise from subclavian arteries. Left vertebral artery strongly dominant and widely patent within the neck. Right vertebral artery hypoplastic and largely occluded, with only scant intermittent thready flow seen within the neck. Right vertebral artery is essentially occluded at the skull base. Skeleton: No visible acute osseous finding. Multilevel cervical spondylosis without significant stenosis. Patient is edentulous. Other neck: No other acute abnormality within the neck. No mass or  adenopathy. Upper chest: Large right with small left layering pleural effusions, partially visualized. Hazy ground-glass opacity with interlobular septal thickening within the visualized lungs consistent with pulmonary interstitial edema. Review of the MIP images confirms the above findings CTA HEAD FINDINGS Anterior circulation: Petrous segments patent bilaterally. Advanced atheromatous change throughout the carotid siphons bilaterally. Associated moderate multifocal narrowing on the left. There is a focal severe stenosis involving the supraclinoid right ICA (series 10, image 232). Left A1 segment widely patent. Moderate stenosis at the origin of the right A1 which is also slightly hypoplastic. Normal anterior communicating artery complex. Both ACAs are patent to their distal aspects without visible occlusion. No M1 stenosis or occlusion. Distal MCA branches well perfused and symmetric. Posterior circulation: Dominant left vertebral artery patent to the vertebrobasilar junction without significant stenosis. Left PICA patent. Hypoplastic right vertebral artery occluded at the skull base. Slight retrograde filling into the distal right V4 segment. The right PICA is not definitely seen. Basilar patent to its distal aspect without stenosis. Superior cerebral arteries patent bilaterally. Both PCAs primarily supplied via the basilar well perfused to their distal aspects. Venous sinuses: Grossly patent allowing for timing the contrast bolus. Anatomic variants: Hypoplastic right vertebral artery. No visible aneurysm. Review of the MIP images confirms the above findings IMPRESSION: CT HEAD IMPRESSION: 1. Evolving acute left ACA distribution infarct involving the parasagittal left frontal lobe. Suspected faint petechial blood products without intraparenchymal hematoma or mass effect. 2. Otherwise stable head CT with underlying atrophy and chronic microvascular ischemic disease. CTA HEAD AND NECK IMPRESSION: 1. Negative CTA  for large vessel occlusion. 2. Advanced atheromatous change about the carotid siphons with associated severe stenosis at the supraclinoid right ICA. More moderate multifocal narrowing on the left. 3. Short-segment 50% stenosis at the origin of the cervical right ICA. No significant stenosis about the left carotid artery system within the neck. 4. Occlusion of the hypoplastic right vertebral artery. Dominant left vertebral artery widely patent without significant stenosis. 5. Large right with small left layering pleural effusions with evidence for pulmonary interstitial edema. Electronically Signed   By: Jeannine Boga M.D.   On: 08/24/2021 02:02   ECHOCARDIOGRAM COMPLETE  Result Date: 08/23/2021    ECHOCARDIOGRAM REPORT   Patient Name:   DUBLIN RUDY Date of Exam: 08/23/2021 Medical Rec #:  [id number]      Height:       69.0 in Accession #:  [id number]     Weight:       135.0 lb Date of Birth:  10/04/1951      BSA:          1.748 m Patient Age:    42 years       BP:           133/90 mmHg Patient Gender: M              HR:           92 bpm. Exam Location:  Inpatient Procedure: 2D Echo, Cardiac Doppler, Color Doppler and Intracardiac            Opacification Agent  Results communicated to Dr Tyrell Antonio at 11:27. Indications:    CVA  History:        Patient has no prior history of Echocardiogram examinations.                 Risk Factors:Diabetes and Dyslipidemia.  Sonographer:    Luisa Hart RDCS Referring Phys: [id number] Glendia Olshefski A Gerson Fauth  Sonographer Comments: Patient is morbidly obese, suboptimal parasternal window, suboptimal apical window and suboptimal subcostal window. Image acquisition challenging due to patient body habitus. IMPRESSIONS  1. Left ventricular ejection fraction, by estimation, is 25 to 30%. The left ventricle has severely decreased function. The left ventricle demonstrates global hypokinesis with apical akinesis. There is moderate asymmetric left ventricular hypertrophy of  the basal-septal  segment. Left ventricular diastolic parameters are consistent with Grade II diastolic dysfunction (pseudonormalization). Elevated left atrial pressure.  2. Right ventricular systolic function is normal. The right ventricular size is normal. Tricuspid regurgitation signal is inadequate for assessing PA pressure.  3. Left atrial size was mildly dilated.  4. The mitral valve is abnormal. No evidence of mitral valve regurgitation. Mild mitral stenosis. MG 34mHg at 88bpm, MVA 1.2 cm^2 by continuity equation. Moderate mitral annular calcification.  5. The aortic valve is calcified. There is moderate calcification of the aortic valve. Aortic valve regurgitation is not visualized. Mild to moderate aortic valve sclerosis/calcification is present, without any evidence of aortic stenosis. FINDINGS  Left Ventricle: Left ventricular ejection fraction, by estimation, is 25 to 30%. The left ventricle has severely decreased function. The left ventricle demonstrates global hypokinesis. Definity contrast agent was given IV to delineate the left ventricular endocardial borders. The left ventricular internal cavity size was normal in size. There is moderate asymmetric left ventricular hypertrophy of the basal-septal segment. Left ventricular diastolic parameters are consistent with Grade II diastolic dysfunction (pseudonormalization). Elevated left atrial pressure. Right Ventricle: The right ventricular size is normal. No increase in right ventricular wall thickness. Right ventricular systolic function is normal. Tricuspid regurgitation signal is inadequate for assessing PA pressure. Left Atrium: Left atrial size was mildly dilated. Right Atrium: Right atrial size was normal in size. Pericardium: Trivial pericardial effusion is present. Mitral Valve: The mitral valve is abnormal. Moderate mitral annular calcification. No evidence of mitral valve regurgitation. Mild mitral valve stenosis. MV peak gradient, 6.2 mmHg. The mean mitral  valve gradient is 3.0 mmHg. Tricuspid Valve: The tricuspid valve is normal in structure. Tricuspid valve regurgitation is trivial. Aortic Valve: The aortic valve is calcified. There is moderate calcification of the aortic valve. Aortic valve regurgitation is not visualized. Mild to moderate aortic valve sclerosis/calcification is present, without any evidence of aortic stenosis. Aortic valve mean gradient measures 3.0 mmHg. Aortic valve peak gradient measures 5.3 mmHg. Aortic valve area, by VTI measures 1.74 cm. Pulmonic Valve: The pulmonic  valve was not well visualized. Pulmonic valve regurgitation is not visualized. Aorta: The aortic root and ascending aorta are structurally normal, with no evidence of dilitation. IAS/Shunts: The interatrial septum was not well visualized.  LEFT VENTRICLE PLAX 2D LVIDd:         4.60 cm  Diastology LVIDs:         4.30 cm  LV e' medial:    3.12 cm/s LV PW:         1.30 cm  LV E/e' medial:  31.6 LV IVS:        1.80 cm  LV e' lateral:   3.94 cm/s LVOT diam:     2.40 cm  LV E/e' lateral: 25.0 LV SV:         34 LV SV Index:   19 LVOT Area:     4.52 cm  RIGHT VENTRICLE RV Basal diam:  2.80 cm RV Mid diam:    1.90 cm RV S prime:     11.50 cm/s TAPSE (M-mode): 1.4 cm LEFT ATRIUM             Index       RIGHT ATRIUM           Index LA Vol (A2C):   60.3 ml 34.49 ml/m RA Area:     10.00 cm LA Vol (A4C):   60.6 ml 34.66 ml/m RA Volume:   22.20 ml  12.70 ml/m LA Biplane Vol: 63.1 ml 36.09 ml/m  AORTIC VALVE                   PULMONIC VALVE AV Area (Vmax):    1.75 cm    PV Vmax:       0.78 m/s AV Area (Vmean):   1.63 cm    PV Vmean:      64.100 cm/s AV Area (VTI):     1.74 cm    PV VTI:        0.149 m AV Vmax:           115.00 cm/s PV Peak grad:  2.4 mmHg AV Vmean:          78.200 cm/s PV Mean grad:  2.0 mmHg AV VTI:            0.195 m AV Peak Grad:      5.3 mmHg AV Mean Grad:      3.0 mmHg LVOT Vmax:         44.60 cm/s LVOT Vmean:        28.100 cm/s LVOT VTI:          0.075 m LVOT/AV  VTI ratio: 0.38  AORTA Ao Root diam: 3.60 cm Ao Asc diam:  3.20 cm MITRAL VALVE MV Area (PHT): 7.59 cm     SHUNTS MV Area VTI:   1.19 cm     Systemic VTI:  0.07 m MV Peak grad:  6.2 mmHg     Systemic Diam: 2.40 cm MV Mean grad:  3.0 mmHg MV Vmax:       1.25 m/s MV Vmean:      76.4 cm/s MV Decel Time: 100 msec MV E velocity: 98.50 cm/s MV A velocity: 119.00 cm/s MV E/A ratio:  0.83 Oswaldo Milian MD Electronically signed by Oswaldo Milian MD Signature Date/Time: 08/23/2021/11:30:45 AM    Final         Scheduled Meds:  aspirin EC  81 mg Oral Daily   atorvastatin  80 mg Oral Daily   clopidogrel  75 mg Oral Daily   cyanocobalamin  1,000 mcg Intramuscular Daily   enoxaparin (LOVENOX) injection  40 mg Subcutaneous Q24H   insulin aspart  0-9 Units Subcutaneous Q4H   LORazepam  0.5 mg Oral Once   spironolactone  12.5 mg Oral Daily   Continuous Infusions:     LOS: 1 day    Time spent: 35 minutes    Thera Basden A Declan Mier, MD [facility]   If 7PM-7AM, please contact night-coverage [email or web address]  08/24/2021, 12:27 PM
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
## 2021-08-24 NOTE — Plan of Care (Signed)
  Problem: Education: Goal: Knowledge of General Education information will improve Description: Including pain rating scale, medication(s)/side effects and non-pharmacologic comfort measures Outcome: Progressing   Problem: Health Behavior/Discharge Planning: Goal: Ability to manage health-related needs will improve Outcome: Progressing   Problem: Clinical Measurements: Goal: Ability to maintain clinical measurements within normal limits will improve Outcome: Progressing Goal: Will remain free from infection Outcome: Progressing Goal: Diagnostic test results will improve Outcome: Progressing Goal: Respiratory complications will improve Outcome: Progressing Goal: Cardiovascular complication will be avoided Outcome: Progressing   Problem: Activity: Goal: Risk for activity intolerance will decrease Outcome: Progressing   Problem: Nutrition: Goal: Adequate nutrition will be maintained Outcome: Progressing   Problem: Coping: Goal: Level of anxiety will decrease Outcome: Progressing   Problem: Elimination: Goal: Will not experience complications related to bowel motility Outcome: Progressing Goal: Will not experience complications related to urinary retention Outcome: Progressing   Problem: Pain Managment: Goal: General experience of comfort will improve Outcome: Progressing   Problem: Safety: Goal: Ability to remain free from injury will improve Outcome: Progressing   Problem: Skin Integrity: Goal: Risk for impaired skin integrity will decrease Outcome: Progressing   Problem: Education: Goal: Knowledge of disease or condition will improve Outcome: Progressing Goal: Knowledge of secondary prevention will improve Outcome: Progressing Goal: Knowledge of patient specific risk factors addressed and post discharge goals established will improve Outcome: Progressing Goal: Individualized Educational Video(s) Outcome: Progressing   Problem: Health Behavior/Discharge  Planning: Goal: Ability to manage health-related needs will improve Outcome: Progressing   Problem: Self-Care: Goal: Ability to participate in self-care as condition permits will improve Outcome: Progressing Goal: Ability to communicate needs accurately will improve Outcome: Progressing
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
## 2021-08-25 DIAGNOSIS — R531 Weakness: Secondary | ICD-10-CM | POA: Diagnosis not present

## 2021-08-25 DIAGNOSIS — I255 Ischemic cardiomyopathy: Secondary | ICD-10-CM | POA: Diagnosis not present

## 2021-08-25 LAB — BASIC METABOLIC PANEL
Anion gap: 8 (ref 5–15)
BUN: 9 mg/dL (ref 8–23)
CO2: 23 mmol/L (ref 22–32)
Calcium: 9.1 mg/dL (ref 8.9–10.3)
Chloride: 105 mmol/L (ref 98–111)
Creatinine, Ser: 0.95 mg/dL (ref 0.61–1.24)
GFR, Estimated: >60 mL/min (ref >60)
Glucose, Bld: 138 mg/dL — ABNORMAL HIGH (ref 70–99)
Potassium: 3.1 mmol/L — ABNORMAL LOW (ref 3.5–5.1)
Sodium: 136 mmol/L (ref 135–145)

## 2021-08-25 LAB — GLUCOSE, CAPILLARY
Glucose-Capillary: 159 mg/dL — ABNORMAL HIGH (ref 70–99)
Glucose-Capillary: 126 mg/dL — ABNORMAL HIGH (ref 70–99)
Glucose-Capillary: 127 mg/dL — ABNORMAL HIGH (ref 70–99)
Glucose-Capillary: 141 mg/dL — ABNORMAL HIGH (ref 70–99)
Glucose-Capillary: 129 mg/dL — ABNORMAL HIGH (ref 70–99)
Glucose-Capillary: 158 mg/dL — ABNORMAL HIGH (ref 70–99)
Glucose-Capillary: 194 mg/dL — ABNORMAL HIGH (ref 70–99)
Glucose-Capillary: 143 mg/dL — ABNORMAL HIGH (ref 70–99)

## 2021-08-25 MED ORDER — POTASSIUM CHLORIDE CRYS ER 20 MEQ PO TBCR
40.0000 meq | EXTENDED_RELEASE_TABLET | Freq: Once | ORAL | Status: AC
  Administered 2021-08-25: 40 meq via ORAL
  Filled 2021-08-25: qty 2

## 2021-08-25 MED ORDER — SACUBITRIL-VALSARTAN 24-26 MG PO TABS
1.0000 | ORAL_TABLET | Freq: Two times a day (BID) | ORAL | Status: DC
  Administered 2021-08-25 – 2021-08-29 (×9): 1 via ORAL
  Filled 2021-08-25 (×10): qty 1
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
## 2021-08-25 NOTE — Plan of Care (Signed)
  Problem: Education: Goal: Knowledge of General Education information will improve Description: Including pain rating scale, medication(s)/side effects and non-pharmacologic comfort measures Outcome: Progressing   Problem: Health Behavior/Discharge Planning: Goal: Ability to manage health-related needs will improve Outcome: Progressing   Problem: Clinical Measurements: Goal: Ability to maintain clinical measurements within normal limits will improve Outcome: Progressing Goal: Will remain free from infection Outcome: Progressing Goal: Diagnostic test results will improve Outcome: Progressing Goal: Respiratory complications will improve Outcome: Progressing Goal: Cardiovascular complication will be avoided Outcome: Progressing   Problem: Activity: Goal: Risk for activity intolerance will decrease Outcome: Progressing   Problem: Nutrition: Goal: Adequate nutrition will be maintained Outcome: Progressing   Problem: Coping: Goal: Level of anxiety will decrease Outcome: Progressing   Problem: Elimination: Goal: Will not experience complications related to bowel motility Outcome: Progressing Goal: Will not experience complications related to urinary retention Outcome: Progressing   Problem: Pain Managment: Goal: General experience of comfort will improve Outcome: Progressing   Problem: Safety: Goal: Ability to remain free from injury will improve Outcome: Progressing   Problem: Skin Integrity: Goal: Risk for impaired skin integrity will decrease Outcome: Progressing   Problem: Education: Goal: Knowledge of disease or condition will improve Outcome: Progressing Goal: Knowledge of secondary prevention will improve Outcome: Progressing Goal: Knowledge of patient specific risk factors addressed and post discharge goals established will improve Outcome: Progressing Goal: Individualized Educational Video(s) Outcome: Progressing   Problem: Self-Care: Goal: Ability to  participate in self-care as condition permits will improve Outcome: Progressing Goal: Ability to communicate needs accurately will improve Outcome: Progressing   Problem: Nutrition: Goal: Risk of aspiration will decrease Outcome: Progressing

## 2021-08-26 ENCOUNTER — Encounter (HOSPITAL_COMMUNITY)
Admission: EM | Disposition: A | Discharge status: Skilled Nursing Facility | Payer: Self-pay | Source: Home / Self Care | Attending: Internal Medicine

## 2021-08-26 ENCOUNTER — Inpatient Hospital Stay (HOSPITAL_COMMUNITY): Payer: Medicare HMO | Admitting: Certified Registered"

## 2021-08-26 ENCOUNTER — Encounter (HOSPITAL_COMMUNITY): Payer: Self-pay | Admitting: Internal Medicine

## 2021-08-26 ENCOUNTER — Other Ambulatory Visit (HOSPITAL_COMMUNITY): Payer: Self-pay

## 2021-08-26 ENCOUNTER — Inpatient Hospital Stay (HOSPITAL_COMMUNITY): Payer: Medicare HMO

## 2021-08-26 DIAGNOSIS — I255 Ischemic cardiomyopathy: Secondary | ICD-10-CM | POA: Diagnosis not present

## 2021-08-26 DIAGNOSIS — R531 Weakness: Secondary | ICD-10-CM | POA: Diagnosis not present

## 2021-08-26 DIAGNOSIS — I25110 Atherosclerotic heart disease of native coronary artery with unstable angina pectoris: Secondary | ICD-10-CM | POA: Diagnosis not present

## 2021-08-26 HISTORY — PX: TEE WITHOUT CARDIOVERSION: SHX5443

## 2021-08-26 LAB — BASIC METABOLIC PANEL
Anion gap: 11 (ref 5–15)
BUN: 11 mg/dL (ref 8–23)
CO2: 21 mmol/L — ABNORMAL LOW (ref 22–32)
Calcium: 8.7 mg/dL — ABNORMAL LOW (ref 8.9–10.3)
Chloride: 102 mmol/L (ref 98–111)
Creatinine, Ser: 1.03 mg/dL (ref 0.61–1.24)
GFR, Estimated: >60 mL/min (ref >60)
Glucose, Bld: 149 mg/dL — ABNORMAL HIGH (ref 70–99)
Potassium: 3.5 mmol/L (ref 3.5–5.1)
Sodium: 134 mmol/L — ABNORMAL LOW (ref 135–145)

## 2021-08-26 LAB — GLUCOSE, CAPILLARY
Glucose-Capillary: 142 mg/dL — ABNORMAL HIGH (ref 70–99)
Glucose-Capillary: 139 mg/dL — ABNORMAL HIGH (ref 70–99)
Glucose-Capillary: 154 mg/dL — ABNORMAL HIGH (ref 70–99)
Glucose-Capillary: 137 mg/dL — ABNORMAL HIGH (ref 70–99)
Glucose-Capillary: 132 mg/dL — ABNORMAL HIGH (ref 70–99)
Glucose-Capillary: 121 mg/dL — ABNORMAL HIGH (ref 70–99)

## 2021-08-26 LAB — VITAMIN B1: Vitamin B1 (Thiamine): 88.6 nmol/L (ref 66.5–200.0)

## 2021-08-26 SURGERY — ECHOCARDIOGRAM, TRANSESOPHAGEAL
Anesthesia: Monitor Anesthesia Care

## 2021-08-26 MED ORDER — CARVEDILOL 3.125 MG PO TABS
3.1250 mg | ORAL_TABLET | Freq: Two times a day (BID) | ORAL | Status: DC
  Administered 2021-08-26 – 2021-08-28 (×5): 3.125 mg via ORAL
  Filled 2021-08-26 (×5): qty 1

## 2021-08-26 MED ORDER — SODIUM CHLORIDE 0.9 % IV SOLN
INTRAVENOUS | Status: DC
Start: 2021-08-26 — End: 2021-08-28

## 2021-08-26 MED ORDER — ONDANSETRON HCL 4 MG/2ML IJ SOLN
INTRAMUSCULAR | Status: DC | PRN
  Administered 2021-08-26: 4 mg via INTRAVENOUS

## 2021-08-26 MED ORDER — SODIUM CHLORIDE 0.9% FLUSH: 3.0000 mL | INTRAVENOUS | Status: DC | PRN

## 2021-08-26 MED ORDER — APIXABAN 5 MG PO TABS
5.0000 mg | ORAL_TABLET | Freq: Two times a day (BID) | ORAL | Status: DC
  Administered 2021-08-26 – 2021-08-29 (×6): 5 mg via ORAL
  Filled 2021-08-26 (×6): qty 1

## 2021-08-26 MED ORDER — BUTAMBEN-TETRACAINE-BENZOCAINE 2-2-14 % EX AERO
INHALATION_SPRAY | CUTANEOUS | Status: DC | PRN
  Administered 2021-08-26: 2 via TOPICAL

## 2021-08-26 MED ORDER — SODIUM CHLORIDE 0.9% FLUSH
3.0000 mL | Freq: Two times a day (BID) | INTRAVENOUS | Status: DC
Start: 2021-08-26 — End: 2021-08-26

## 2021-08-26 MED ORDER — ASPIRIN 81 MG PO CHEW: 81.0000 mg | CHEWABLE_TABLET | ORAL | Status: DC

## 2021-08-26 MED ORDER — PROPOFOL 500 MG/50ML IV EMUL
INTRAVENOUS | Status: DC | PRN
  Administered 2021-08-26: 65 ug/kg/min via INTRAVENOUS

## 2021-08-26 MED ORDER — SODIUM CHLORIDE 0.9 % IV SOLN: INTRAVENOUS | Status: DC

## 2021-08-26 MED ORDER — SODIUM CHLORIDE 0.9 % IV SOLN: 250.0000 mL | INTRAVENOUS | Status: DC | PRN

## 2021-08-26 NOTE — Anesthesia Postprocedure Evaluation (Signed)
Anesthesia Post Note  Patient: Shawn [name]  Procedure(s) Performed: TRANSESOPHAGEAL ECHOCARDIOGRAM (TEE)     Patient location during evaluation: PACU Anesthesia Type: MAC Level of consciousness: awake and alert Pain management: pain level controlled Vital Signs Assessment: post-procedure vital signs reviewed and stable Respiratory status: spontaneous breathing, nonlabored ventilation, respiratory function stable and patient connected to nasal cannula oxygen Cardiovascular status: stable and blood pressure returned to baseline Postop Assessment: no apparent nausea or vomiting Anesthetic complications: no   No notable events documented.  Last Vitals:  Vitals:   08/26/21 0915 08/26/21 1045  BP: 99/62 129/84  Pulse: 93 98  Resp: (!) 21 18  Temp:  37 C  SpO2: 100% 98%    Last Pain:  Vitals:   08/26/21 1045  TempSrc: Oral  PainSc: 0-No pain                 Tiajuana Amass

## 2021-08-26 NOTE — Progress Notes (Signed)
PT Cancellation Note  Patient Details Name: TEMITOPE FEKETE MRN: [id number] DOB: 04-Aug-1951   Cancelled Treatment:    Reason Eval/Treat Not Completed: Patient at procedure or test/unavailable (pt at TEE -AM) Did not have time to reattempt in PM. Will continue efforts next date.   Kara Pacer Beckett Hickmon 08/26/2021, 5:14 PM * delayed entry

## 2021-08-26 NOTE — Transfer of Care (Signed)
Immediate Anesthesia Transfer of Care Note  Patient: Shawn [name]  Procedure(s) Performed: TRANSESOPHAGEAL ECHOCARDIOGRAM (TEE)  Patient Location: Endoscopy Unit  Anesthesia Type:MAC  Level of Consciousness: sedated  Airway & Oxygen Therapy: Patient connected to nasal cannula oxygen  Post-op Assessment: Post -op Vital signs reviewed and stable  Post vital signs: stable  Last Vitals:  Vitals Value Taken Time  BP    Temp    Pulse    Resp    SpO2      Last Pain:  Vitals:   08/26/21 0810  TempSrc: Temporal  PainSc:       Patients Stated Pain Goal: 0 (34/19/62 2297)  Complications: No notable events documented.

## 2021-08-26 NOTE — CV Procedure (Signed)
Procedure: TEE  Indication: CVA  Sedation: Per anesthesiology  Findings: Please see echo section for full report.    Patient was noted to have a large left atrial appendage thrombus, this is the likely cause of his CVA.   Discussed with pharmacy, will start Eliquis and stop Plavix.   Loralie Champagne 08/26/2021 8:57 AM

## 2021-08-26 NOTE — TOC Progression Note (Signed)
Transition of Care [facility]) - Progression Note    Patient Details  Name: [name] MRN: [id number] Date of Birth: June 15, 1951  Transition of [facility]) CM/SW Contact  Joanne Chars, LCSW Phone Number: 08/26/2021, 3:51 PM  Clinical Narrative:  CSW spoke with pt daughter Overton Mam,   [phone].  She lives in [place] but says that pt has family in [place] and their first choice would be one of those places.  CSW spoke with Carollee Leitz at [facility] and sent referral to [facility].  Kim said [facility] does not have a bed today, check back to see about later this week.  CSW also spoke to [place] at [facility] and she will review referral shortly.      Expected Discharge Plan: Skilled Nursing Facility Barriers to Discharge: Continued Medical Work up, Ship broker  Expected Discharge Plan and Services Expected Discharge Plan: [place]     Post Acute Care Choice: NA Living arrangements for the past 2 months: Apartment                                       Social Determinants of Health (SDOH) Interventions    Readmission Risk Interventions No flowsheet data found.

## 2021-08-26 NOTE — Care Management Important Message (Signed)
Important Message  Patient Details  Name: Shawn [name] MRN: [id number] Date of Birth: 06-07-1951   Medicare Important Message Given:  Yes     Orbie Pyo 08/26/2021, 3:25 PM

## 2021-08-26 NOTE — NC FL2 (Signed)
Benjamin Perez LEVEL OF CARE SCREENING TOOL     IDENTIFICATION  Patient Name: Shawn [name] Birthdate: 07-12-1951 Sex: male Admission Date (Current Location): 08/22/2021  [facility] and [place] Number:  [employer] and Address:  [facility] [place], [place] [address], [place], [place] [address]      Provider Number: [id number]  Attending Physician Name and Address:  Elmarie Shiley, MD  Relative Name and Phone Number:       Current Level of Care: Hospital Recommended Level of Care: [place] Prior Approval Number:    Date Approved/Denied:   PASRR Number: [id number]  Discharge Plan: SNF    Current Diagnoses: Patient Active Problem List   Diagnosis Date Noted   Cerebral embolism with cerebral infarction 08/23/2021   Right sided weakness 08/22/2021   Essential hypertension 08/22/2021   PAD (peripheral artery disease) ([place]) 01/08/2019   Knee injury, right, initial encounter 01/07/2019   History of amputation of right lower extremity through tibia and fibula ([place]) 08/18/2018   Type 2 diabetes mellitus with circulatory disorder, without long-term current use of insulin ([place]) 07/23/2018   Osteomyelitis of hand, left, acute ([place]) 02/18/2018    Orientation RESPIRATION BLADDER Height & Weight     Self, Time, Situation, Place  Normal Incontinent Weight: 134 lb 14.7 oz (61.2 kg) Height:  '5\' 9"'$  (175.3 cm)  BEHAVIORAL SYMPTOMS/MOOD NEUROLOGICAL BOWEL NUTRITION STATUS      Incontinent Diet (see DC summary)  AMBULATORY STATUS COMMUNICATION OF NEEDS Skin   Extensive Assist Non-Verbally Normal                       Personal Care Assistance Level of Assistance  Bathing, Feeding, Dressing Bathing Assistance: Maximum assistance Feeding assistance: Limited assistance Dressing Assistance: Maximum assistance     Functional Limitations Info  Speech     Speech Info: Impaired (dysarthria)    SPECIAL CARE FACTORS FREQUENCY  PT  (By licensed PT), OT (By licensed OT), Speech therapy     PT Frequency: 5x/wk OT Frequency: 5x/wk     Speech Therapy Frequency: 5x/wk      Contractures Contractures Info: Not present    Additional Factors Info  Code Status, Allergies, Insulin Sliding Scale Code Status Info: Full Allergies Info: NKA   Insulin Sliding Scale Info: see DC summary       Current Medications (08/26/2021):  This is the current hospital active medication list Current Facility-Administered Medications  Medication Dose Route Frequency Provider Last Rate Last Admin   0.9 %  sodium chloride infusion   Intravenous Continuous Clegg, Amy D, NP   Held at 08/26/21 0800   acetaminophen (TYLENOL) tablet 650 mg  650 mg Oral Q4H PRN Rise Patience, MD       Or   acetaminophen (TYLENOL) 160 MG/5ML solution 650 mg  650 mg Per Tube Q4H PRN Rise Patience, MD       Or   acetaminophen (TYLENOL) suppository 650 mg  650 mg Rectal Q4H PRN Rise Patience, MD       apixaban Arne Cleveland) tablet 5 mg  5 mg Oral BID Larey Dresser, MD       aspirin EC tablet 81 mg  81 mg Oral Daily Rosalin Hawking, MD   81 mg at 08/25/21 1051   atorvastatin (LIPITOR) tablet 80 mg  80 mg Oral Daily Gardiner Barefoot, NP   80 mg at 08/25/21 1051   carvedilol (COREG) tablet  3.125 mg  3.125 mg Oral BID WC Larey Dresser, MD       cyanocobalamin ((VITAMIN B-12)) injection 1,000 mcg  1,000 mcg Intramuscular Daily Regalado, Belkys A, MD   1,000 mcg at 08/25/21 1051   hydrALAZINE (APRESOLINE) injection 10 mg  10 mg Intravenous Q4H PRN Rise Patience, MD       insulin aspart (novoLOG) injection 0-9 Units  0-9 Units Subcutaneous Q4H Regalado, Belkys A, MD   1 Units at 08/26/21 0450   insulin glargine-yfgn (SEMGLEE) injection 10 Units  10 Units Subcutaneous Daily Regalado, Belkys A, MD   10 Units at 08/25/21 1051   LORazepam (ATIVAN) tablet 0.5 mg  0.5 mg Oral Once Rise Patience, MD       sacubitril-valsartan (ENTRESTO) 24-26  mg per tablet  1 tablet Oral BID Larey Dresser, MD   1 tablet at 08/25/21 2127   sodium chloride flush (NS) 0.9 % injection 3 mL  3 mL Intravenous Q12H Clegg, Amy D, NP       spironolactone (ALDACTONE) tablet 12.5 mg  12.5 mg Oral Daily Larey Dresser, MD   12.5 mg at 08/25/21 1051     Discharge Medications: Please see discharge summary for a list of discharge medications.  Relevant Imaging Results:  Relevant Lab Results:   Additional Information SS#: [id number]  Geralynn Ochs, LCSW

## 2021-08-26 NOTE — Progress Notes (Signed)
Nurse unable to take vitals this morning, pt being transferred for TEE during nurse-to-nurse handoff. Pt is alert and oriented x 3, confused to time.

## 2021-08-26 NOTE — Progress Notes (Signed)
  Echocardiogram Echocardiogram Transesophageal has been performed.  Bobbye Charleston 08/26/2021, 9:20 AM

## 2021-08-26 NOTE — TOC Benefit Eligibility Note (Signed)
Patient Teacher, English as a foreign language completed.    The patient is currently admitted and upon discharge could be taking Eliquis 5 mg.  The current 30 day co-pay is, $9.85.   The patient is insured through [employer] [place] Patient Advocate Specialist [place] Team Direct Number: [phone]  Fax: [phone]

## 2021-08-26 NOTE — Progress Notes (Signed)
[place] for apixaban Indication:  LAA thrombus  No Known Allergies  Patient Measurements: Height: '5\' 9"'$  (175.3 cm) Weight: 61.2 kg (134 lb 14.7 oz) IBW/kg (Calculated) : 70.7 Heparin Dosing Weight: 61.2 kg   Vital Signs: Temp: 98.6 F (37 C) (09/12 1045) Temp Source: Oral (09/12 1045) BP: 129/84 (09/12 1045) Pulse Rate: 98 (09/12 1045)  Labs: Recent Labs    08/23/21 1249 08/23/21 1409 08/24/21 0840 08/25/21 0245 08/26/21 0332  HGB  --   --  13.3  --   --   HCT  --   --  40.6  --   --   PLT  --   --  312  --   --   CREATININE  --   --  1.00 0.95 1.03  TROPONINIHS 184* 189*  --   --   --     Estimated Creatinine Clearance: 58.6 mL/min (by C-G formula based on SCr of 1.03 mg/dL).   Medical History: Past Medical History:  Diagnosis Date   Diabetes mellitus without complication (HCC)    Elevated cholesterol     Medications:  Scheduled:   apixaban  5 mg Oral BID   aspirin EC  81 mg Oral Daily   atorvastatin  80 mg Oral Daily   carvedilol  3.125 mg Oral BID WC   cyanocobalamin  1,000 mcg Intramuscular Daily   insulin aspart  0-9 Units Subcutaneous Q4H   insulin glargine-yfgn  10 Units Subcutaneous Daily   LORazepam  0.5 mg Oral Once   sacubitril-valsartan  1 tablet Oral BID   spironolactone  12.5 mg Oral Daily    Assessment: 56 yom who was found to acute L ACA infarct - found to have large left atrial appendage thrombus on TEE today. Discussed with Dr Aundra Dubin and neurology, plan to stop plavix and use apixaban and aspirin. No AC PTA.  CBC stable on last check 9/10 - hgb 13.3, plt 312. No s/sx of bleeding.   Goal of Therapy:  Monitor platelets by anticoagulation protocol: Yes   Plan:  Apixaban 5 mg BID  Monitor s/sx of bleeding, CBC   Antonietta Jewel, PharmD, BCCCP Clinical Pharmacist  Phone: [phone] 08/26/2021 12:43 PM  Please check AMION for all [place] phone numbers After 10:00 PM, call [place]  [phone]

## 2021-08-26 NOTE — Interval H&P Note (Signed)
History and Physical Interval Note:  08/26/2021 8:37 AM  Shawn [name]  has presented today for surgery, with the diagnosis of stroke.  The various methods of treatment have been discussed with the patient and family. After consideration of risks, benefits and other options for treatment, the patient has consented to  Procedure(s): TRANSESOPHAGEAL ECHOCARDIOGRAM (TEE) (N/A) as a surgical intervention.  The patient's history has been reviewed, patient examined, no change in status, stable for surgery.  I have reviewed the patient's chart and labs.  Questions were answered to the patient's satisfaction.     [name]

## 2021-08-26 NOTE — Progress Notes (Signed)
PROGRESS NOTE    Shawn [name]  [id number] DOB: 1951-06-23 DOA: 08/22/2021 PCP: [name], PA-C   Brief Narrative: 70 year old with past medical history significant for diabetes type 2, hypertension, peripheral vascular disease a status post right BKA and left AKA, hyperlipidemia was found to be confused.  EMS was called and patient was found to have right side weakness and also had some right-sided facial droop.  Patient last known normal was around 3 days prior to admission.  At baseline patient is usually wheelchair-bound but is able to easily transfer from wheelchair to the bed and is usually alert and awake and oriented.  CT Head: Show atrophy, chronic microvascular disease.  No acute abnormality. Able to proceed with MRI of the brain because patient has large number of FB  metal from [place].  Patient is felt not to be candidate for MRI due to safety.   Assessment & Plan:   Principal Problem:   Right sided weakness Active Problems:   PAD (peripheral artery disease) (HCC)   Type 2 diabetes mellitus with circulatory disorder, without long-term current use of insulin (HCC)   Essential hypertension   Cerebral embolism with cerebral infarction  1-Acute left ACA distribution Infarct involving the parasagittal left frontal Lobe.  Right-sided weakness, confusion: -Neurology has been consulted -Unable to proceed with MRI due to large numbers of FB metal from [place] patient is not felt to be a candidate for MRI due to safety. -Speech, PT OT consulted, needs SNF.  -LDL; 142. Now on Lipitor.  --A1c; 10.4 -2D echo: Ef [id number], Grade 2 Diastolic Dysfunction, Global Hypokinesis.  - CT head neck: Evolving acute left ACA distribution infarct involving the parasagittal left frontal lobe. Suspected faint petechial blood products without intraparenchymal hematoma or mass effect. Negative CTA for large vessel occlusion. Short-segment 50% stenosis at the origin of the cervical right ICA -He was  initially on aspirin and plavix.  Plavix discontinued, he was a started on Eliquis -TEE: Large left atrial appendage thrombus. -Awaiting neurology recommendation in regards if patient needs aspirin as well.  2-Hypertension: Permissive hypertension. PRN hydralazine.   3-Diabetes type 2 with hyperglycemia: A1c: 10.  SSI.  Needs Better controlled.  Started  Semglee insulin.   Large left atrial appendage thrombus: Started on Eliquis.   History of hyperlipidemia: Now on Lipitor.  New cardiomyopathy, Systolic and Diastolic Heart failure.  Cardiology consulted. Appreciate assistance.  Patient will need ischemic evaluation at some point.  Tolerating spironolactone and Entresto.  TEE positive for large left atrial appendage thrombus.  Started on Eliquis ECHO Ef [id number], grade 2 Diastolic Dysfunction.  Cardiology planning cath as an outpatient now the patient was found to have atrial thrombus.  B12 Deficiency; at 93. Started  B 12 supplement. Continue, IM for 7 days. Will need oral supplement.   Peripheral vascular disease status post right BKA and left AKA. Hypokalemia; replete orally.   Estimated body mass index is 19.92 kg/m as calculated from the following:   Height as of this encounter: '5\' 9"'$  (1.753 m).   Weight as of this encounter: 61.2 kg.   DVT prophylaxis: Lovenox Code Status: Full code Family Communication: Mother over phone 9/08---Daughter updated (9/12)  [id number] Disposition Plan:  Status is: Observation  The patient remains OBS appropriate and will d/c before 2 midnights.  Dispo: The patient is from: Home              Anticipated d/c is to:  To be determined  Patient currently is not medically stable to d/c.   Difficult to place patient No        Consultants:  Neurology   Procedures:  None  Antimicrobials:    Subjective: He is sleepy, he came from TEE.  He agreed to go to skilled rehab  Objective: Vitals:   08/26/21 0900 08/26/21  0905 08/26/21 0915 08/26/21 1045  BP: 101/79 (!) 92/56 99/62 129/84  Pulse: 91 91 93 98  Resp: (!) 24 (!) 24 (!) 21 18  Temp:    98.6 F (37 C)  TempSrc:    Oral  SpO2: 96% 97% 100% 98%  Weight:      Height:        Intake/Output Summary (Last 24 hours) at 08/26/2021 1452 Last data filed at 08/26/2021 0851 Gross per 24 hour  Intake 220 ml  Output 400 ml  Net -180 ml    Filed Weights   08/26/21 0810  Weight: 61.2 kg    Examination:  General exam: NAD Respiratory system: CTA Cardiovascular system: S 1, S 2 RRR Gastrointestinal system: BS present, soft, nt Central nervous system:Sleepy post procedure, right side weakness Extremities: Right BKA, Left AKA   Data Reviewed: I have personally reviewed following labs and imaging studies  CBC: Recent Labs  Lab 08/22/21 0119 08/22/21 0126 08/22/21 0601 08/23/21 0745 08/24/21 0840  WBC 8.8  --  8.6 7.6 8.3  NEUTROABS 6.4  --   --   --   --   HGB 13.4 13.6 13.0 13.0 13.3  HCT 39.7 40.0 38.4* 39.8 40.6  MCV 96.8  --  96.2 97.5 98.1  PLT 304  --  313 318 [id number]    Basic Metabolic Panel: Recent Labs  Lab 08/22/21 0119 08/22/21 0126 08/22/21 0601 08/23/21 0745 08/24/21 0840 08/25/21 0245 08/26/21 0332  NA 134* 136  --  137 136 136 134*  K 4.1 4.2  --  3.6 3.6 3.1* 3.5  CL 99 102  --  106 108 105 102  CO2 20*  --   --  23 21* 23 21*  GLUCOSE 234* 228*  --  149* 172* 138* 149*  BUN 10 9  --  '9 10 9 11  '$ CREATININE 1.10 0.90 1.07 0.85 1.00 0.95 1.03  CALCIUM 9.2  --   --  8.8* 9.0 9.1 8.7*    GFR: Estimated Creatinine Clearance: 58.6 mL/min (by C-G formula based on SCr of 1.03 mg/dL). Liver Function Tests: Recent Labs  Lab 08/22/21 0119  AST 15  ALT 15  ALKPHOS 79  BILITOT 1.0  PROT 6.9  ALBUMIN 2.7*    No results for input(s): LIPASE, AMYLASE in the last 168 hours. Recent Labs  Lab 08/23/21 0104  AMMONIA 19    Coagulation Profile: Recent Labs  Lab 08/22/21 0119  INR 1.0    Cardiac  Enzymes: No results for input(s): CKTOTAL, CKMB, CKMBINDEX, TROPONINI in the last 168 hours. BNP (last 3 results) No results for input(s): PROBNP in the last 8760 hours. HbA1C: No results for input(s): HGBA1C in the last 72 hours.  CBG: Recent Labs  Lab 08/25/21 1943 08/25/21 2329 08/26/21 0411 08/26/21 0830 08/26/21 1109  GLUCAP 194* 143* 142* 139* 154*    Lipid Profile: No results for input(s): CHOL, HDL, LDLCALC, TRIG, CHOLHDL, LDLDIRECT in the last 72 hours.  Thyroid Function Tests: No results for input(s): TSH, T4TOTAL, FREET4, T3FREE, THYROIDAB in the last 72 hours.  Anemia Panel: No results for input(s): VITAMINB12, FOLATE, FERRITIN, TIBC,  IRON, RETICCTPCT in the last 72 hours.  Sepsis Labs: No results for input(s): PROCALCITON, LATICACIDVEN in the last 168 hours.  Recent Results (from the past 240 hour(s))  Resp Panel by RT-PCR (Flu A&B, Covid) Nasopharyngeal Swab     Status: None   Collection Time: 08/22/21  1:13 AM   Specimen: Nasopharyngeal Swab; Nasopharyngeal(NP) swabs in vial transport medium  Result Value Ref Range Status   SARS Coronavirus 2 by RT PCR NEGATIVE NEGATIVE Final    Comment: (NOTE) SARS-CoV-2 target nucleic acids are NOT DETECTED.  The SARS-CoV-2 RNA is generally detectable in upper respiratory specimens during the acute phase of infection. The lowest concentration of SARS-CoV-2 viral copies this assay can detect is 138 copies/mL. A negative result does not preclude SARS-Cov-2 infection and should not be used as the sole basis for treatment or other patient management decisions. A negative result may occur with  improper specimen collection/handling, submission of specimen other than nasopharyngeal swab, presence of viral mutation(s) within the areas targeted by this assay, and inadequate number of viral copies(<138 copies/mL). A negative result must be combined with clinical observations, patient history, and epidemiological information.  The expected result is Negative.  Fact Sheet for Patients:  EntrepreneurPulse.com.au  Fact Sheet for Healthcare Providers:  IncredibleEmployment.be  This test is no t yet approved or cleared by the [place] FDA and  has been authorized for detection and/or diagnosis of SARS-CoV-2 by FDA under an Emergency Use Authorization (EUA). This EUA will remain  in effect (meaning this test can be used) for the duration of the COVID-19 declaration under Section 564(b)(1) of the Act, 21 U.S.C.section 360bbb-3(b)(1), unless the authorization is terminated  or revoked sooner.       Influenza A by PCR NEGATIVE NEGATIVE Final   Influenza B by PCR NEGATIVE NEGATIVE Final    Comment: (NOTE) The Xpert Xpress SARS-CoV-2/FLU/RSV plus assay is intended as an aid in the diagnosis of influenza from Nasopharyngeal swab specimens and should not be used as a sole basis for treatment. Nasal washings and aspirates are unacceptable for Xpert Xpress SARS-CoV-2/FLU/RSV testing.  Fact Sheet for Patients: EntrepreneurPulse.com.au  Fact Sheet for Healthcare Providers: IncredibleEmployment.be  This test is not yet approved or cleared by the [place] FDA and has been authorized for detection and/or diagnosis of SARS-CoV-2 by FDA under an Emergency Use Authorization (EUA). This EUA will remain in effect (meaning this test can be used) for the duration of the COVID-19 declaration under Section 564(b)(1) of the Act, 21 U.S.C. section 360bbb-3(b)(1), unless the authorization is terminated or revoked.  Performed at [facility], [place] [address] [place], [place] [address]           Radiology Studies: No results found.      Scheduled Meds:  apixaban  5 mg Oral BID   atorvastatin  80 mg Oral Daily   carvedilol  3.125 mg Oral BID WC   cyanocobalamin  1,000 mcg Intramuscular Daily   insulin aspart  0-9 Units  Subcutaneous Q4H   insulin glargine-yfgn  10 Units Subcutaneous Daily   LORazepam  0.5 mg Oral Once   sacubitril-valsartan  1 tablet Oral BID   spironolactone  12.5 mg Oral Daily   Continuous Infusions:  sodium chloride Stopped (08/26/21 0800)      LOS: 3 days    Time spent: 35 minutes    Analys Ryden A Darey Hershberger, MD [facility]   If 7PM-7AM, please contact night-coverage [email or web address]  08/26/2021, 2:52 PM

## 2021-08-26 NOTE — Anesthesia Preprocedure Evaluation (Addendum)
Anesthesia Evaluation  Patient identified by MRN, date of birth, ID band Patient awake    Reviewed: Allergy & Precautions, NPO status , Patient's Chart, lab work & pertinent test results  Airway Mallampati: II  TM Distance: >3 FB Neck ROM: Full    Dental  (+) Dental Advisory Given   Pulmonary neg pulmonary ROS,    breath sounds clear to auscultation       Cardiovascular hypertension, Pt. on medications + Peripheral Vascular Disease and +CHF   Rhythm:Regular Rate:Normal  EF 25-30%. Normal RVF.    Neuro/Psych CVA    GI/Hepatic negative GI ROS, Neg liver ROS,   Endo/Other  diabetes, Type 2  Renal/GU negative Renal ROS     Musculoskeletal   Abdominal   Peds  Hematology negative hematology ROS (+)   Anesthesia Other Findings   Reproductive/Obstetrics                             Anesthesia Physical Anesthesia Plan  ASA: 4  Anesthesia Plan: MAC   Post-op Pain Management:    Induction:   PONV Risk Score and Plan: 1 and Propofol infusion, Ondansetron and Treatment may vary due to age or medical condition  Airway Management Planned: Natural Airway and Simple Face Mask  Additional Equipment: None  Intra-op Plan:   Post-operative Plan:   Informed Consent: I have reviewed the patients History and Physical, chart, labs and discussed the procedure including the risks, benefits and alternatives for the proposed anesthesia with the patient or authorized representative who has indicated his/her understanding and acceptance.     Consent reviewed with POA and Dental advisory given  Plan Discussed with: CRNA  Anesthesia Plan Comments:       Anesthesia Quick Evaluation
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
## 2021-08-26 NOTE — Plan of Care (Signed)
  Problem: Education: Goal: Knowledge of General Education information will improve Description: Including pain rating scale, medication(s)/side effects and non-pharmacologic comfort measures Outcome: Progressing   Problem: Health Behavior/Discharge Planning: Goal: Ability to manage health-related needs will improve Outcome: Progressing   Problem: Clinical Measurements: Goal: Ability to maintain clinical measurements within normal limits will improve Outcome: Progressing Goal: Will remain free from infection Outcome: Progressing Goal: Diagnostic test results will improve Outcome: Progressing Goal: Respiratory complications will improve Outcome: Progressing Goal: Cardiovascular complication will be avoided Outcome: Progressing   Problem: Activity: Goal: Risk for activity intolerance will decrease Outcome: Progressing   Problem: Nutrition: Goal: Adequate nutrition will be maintained Outcome: Progressing   Problem: Coping: Goal: Level of anxiety will decrease Outcome: Progressing   Problem: Elimination: Goal: Will not experience complications related to bowel motility Outcome: Progressing Goal: Will not experience complications related to urinary retention Outcome: Progressing   Problem: Safety: Goal: Ability to remain free from injury will improve Outcome: Progressing   Problem: Skin Integrity: Goal: Risk for impaired skin integrity will decrease Outcome: Progressing   Problem: Education: Goal: Knowledge of disease or condition will improve Outcome: Progressing Goal: Knowledge of secondary prevention will improve Outcome: Progressing Goal: Knowledge of patient specific risk factors addressed and post discharge goals established will improve Outcome: Progressing Goal: Individualized Educational Video(s) Outcome: Progressing   Problem: Health Behavior/Discharge Planning: Goal: Ability to manage health-related needs will improve Outcome: Progressing   Problem:  Nutrition: Goal: Risk of aspiration will decrease Outcome: Progressing   Problem: Ischemic Stroke/TIA Tissue Perfusion: Goal: Complications of ischemic stroke/TIA will be minimized Outcome: Progressing

## 2021-08-27 ENCOUNTER — Encounter (HOSPITAL_COMMUNITY)
Admission: EM | Disposition: A | Discharge status: Skilled Nursing Facility | Payer: Self-pay | Source: Home / Self Care | Attending: Internal Medicine

## 2021-08-27 ENCOUNTER — Encounter (HOSPITAL_COMMUNITY): Payer: Self-pay | Admitting: Cardiology

## 2021-08-27 DIAGNOSIS — I5022 Chronic systolic (congestive) heart failure: Secondary | ICD-10-CM | POA: Diagnosis not present

## 2021-08-27 LAB — CBC
HCT: 38.9 % — ABNORMAL LOW (ref 39.0–52.0)
Hemoglobin: 13.1 g/dL (ref 13.0–17.0)
MCH: 32.5 pg (ref 26.0–34.0)
MCHC: 33.7 g/dL (ref 30.0–36.0)
MCV: 96.5 fL (ref 80.0–100.0)
Platelets: 258 10*3/uL (ref 150–400)
RBC: 4.03 MIL/uL — ABNORMAL LOW (ref 4.22–5.81)
RDW: 14.8 % (ref 11.5–15.5)
WBC: 6.6 10*3/uL (ref 4.0–10.5)
nRBC: 0 % (ref 0.0–0.2)

## 2021-08-27 LAB — BASIC METABOLIC PANEL
Anion gap: 12 (ref 5–15)
BUN: 10 mg/dL (ref 8–23)
CO2: 20 mmol/L — ABNORMAL LOW (ref 22–32)
Calcium: 8.5 mg/dL — ABNORMAL LOW (ref 8.9–10.3)
Chloride: 99 mmol/L (ref 98–111)
Creatinine, Ser: 1.03 mg/dL (ref 0.61–1.24)
GFR, Estimated: >60 mL/min (ref >60)
Glucose, Bld: 112 mg/dL — ABNORMAL HIGH (ref 70–99)
Potassium: 3.5 mmol/L (ref 3.5–5.1)
Sodium: 131 mmol/L — ABNORMAL LOW (ref 135–145)

## 2021-08-27 LAB — GLUCOSE, CAPILLARY
Glucose-Capillary: 121 mg/dL — ABNORMAL HIGH (ref 70–99)
Glucose-Capillary: 113 mg/dL — ABNORMAL HIGH (ref 70–99)
Glucose-Capillary: 132 mg/dL — ABNORMAL HIGH (ref 70–99)
Glucose-Capillary: 177 mg/dL — ABNORMAL HIGH (ref 70–99)
Glucose-Capillary: 133 mg/dL — ABNORMAL HIGH (ref 70–99)

## 2021-08-27 SURGERY — LEFT HEART CATH AND CORONARY ANGIOGRAPHY
Anesthesia: LOCAL

## 2021-08-27 MED ORDER — DAPAGLIFLOZIN PROPANEDIOL 10 MG PO TABS: 10.0000 mg | ORAL_TABLET | Freq: Every day | ORAL | 3 refills | Status: DC

## 2021-08-27 MED ORDER — ATORVASTATIN CALCIUM 80 MG PO TABS: 80.0000 mg | ORAL_TABLET | Freq: Every day | ORAL | 3 refills | Status: AC

## 2021-08-27 MED ORDER — VITAMIN B-12 1000 MCG PO TABS: 1000.0000 ug | ORAL_TABLET | Freq: Every day | ORAL | 3 refills | Status: AC

## 2021-08-27 MED ORDER — CARVEDILOL 3.125 MG PO TABS: 3.1250 mg | ORAL_TABLET | Freq: Two times a day (BID) | ORAL | 3 refills | Status: AC

## 2021-08-27 MED ORDER — INSULIN GLARGINE-YFGN 100 UNIT/ML ~~LOC~~ SOLN: 10.0000 [IU] | Freq: Every day | SUBCUTANEOUS | 11 refills | Status: AC

## 2021-08-27 MED ORDER — DAPAGLIFLOZIN PROPANEDIOL 10 MG PO TABS
10.0000 mg | ORAL_TABLET | Freq: Every day | ORAL | Status: DC
  Administered 2021-08-27 – 2021-08-29 (×3): 10 mg via ORAL
  Filled 2021-08-27 (×3): qty 1

## 2021-08-27 MED ORDER — SACUBITRIL-VALSARTAN 24-26 MG PO TABS: 1.0000 | ORAL_TABLET | Freq: Two times a day (BID) | ORAL | 3 refills | Status: DC

## 2021-08-27 MED ORDER — SPIRONOLACTONE 25 MG PO TABS: 12.5000 mg | ORAL_TABLET | Freq: Every day | ORAL | 3 refills | Status: DC

## 2021-08-27 MED ORDER — APIXABAN 5 MG PO TABS: 5.0000 mg | ORAL_TABLET | Freq: Two times a day (BID) | ORAL | 3 refills | Status: AC

## 2021-08-27 NOTE — Discharge Summary (Signed)
Physician Discharge Summary  Shawn [name] [id number] DOB: 1951/01/20 DOA: 08/22/2021  PCP: [name], PA-C  Admit date: 08/22/2021 Discharge date: 08/28/2021  Admitted From: Home  Disposition:  SNF  Recommendations for Outpatient Follow-up:  Follow up with PCP in 1-2 weeks Please obtain BMP/CBC in one week Needs follow up with cardiology for management of HF and he will need heart Cath.  Follow up with Neurology after Stroke.  Needs further management of Diabetic medications.  Needs repeat B 12 level.    Discharge Condition: Stable.  CODE STATUS:Full Code Diet recommendation: Heart Healthy / Carb Modified   Brief/Interim Summary: 70 year old with past medical history significant for diabetes type 2, hypertension, peripheral vascular disease a status post right BKA and left AKA, hyperlipidemia was found to be confused.  EMS was called and patient was found to have right side weakness and also had some right-sided facial droop.  Patient last known normal was around 3 days prior to admission.  At baseline patient is usually wheelchair-bound but is able to easily transfer from wheelchair to the bed and is usually alert and awake and oriented.   CT Head: Show atrophy, chronic microvascular disease.  No acute abnormality. Able to proceed with MRI of the brain because patient has large number of FB  metal from [place].  Patient is felt not to be candidate for MRI due to safety.   1-Acute left ACA distribution Infarct involving the parasagittal left frontal Lobe.  Right-sided weakness, confusion: -Neurology has been consulted -Unable to proceed with MRI due to large numbers of FB metal from [place] patient is not felt to be a candidate for MRI due to safety. -Speech, PT OT consulted, needs SNF.  -LDL; 142. Now on Lipitor.  --A1c; 10.4 -2D echo: Ef [id number], Grade 2 Diastolic Dysfunction, Global Hypokinesis.  - CT head neck: Evolving acute left ACA distribution infarct involving the parasagittal  left frontal lobe. Suspected faint petechial blood products without intraparenchymal hematoma or mass effect. Negative CTA for large vessel occlusion. Short-segment 50% stenosis at the origin of the cervical right ICA -He was initially on aspirin and plavix.  Plavix discontinued, he was a started on Eliquis -TEE: Large left atrial appendage thrombus. -Discussed with neurology, no need for aspirin.    2-Hypertension: Started on [place], coreg.  PRN hydralazine.    3-Diabetes type 2 with hyperglycemia: A1c: 10.  SSI.  Needs Better controlled.  Started  Semglee insulin. Started on Farxiga.    Large left atrial appendage thrombus: Started on Eliquis. Continue    History of hyperlipidemia: Now on Lipitor.   New cardiomyopathy, Systolic and Diastolic Heart failure.  Cardiology consulted. Appreciate assistance.  Patient will need ischemic evaluation at some point.  Tolerating spironolactone and Entresto.  TEE positive for large left atrial appendage thrombus.  Started on Eliquis ECHO Ef [id number], grade 2 Diastolic Dysfunction.  Cardiology planning cath as an outpatient now the patient was found to have atrial thrombus.   B12 Deficiency; at 70. Started  B 12 supplement. Continue, IM for 7 days. Discharge on oral supplement.    Peripheral vascular disease status post right BKA and left AKA. Hypokalemia; replete orally.     Discharge Diagnoses:  Principal Problem:   Right sided weakness Active Problems:   PAD (peripheral artery disease) (HCC)   Type 2 diabetes mellitus with circulatory disorder, without long-term current use of insulin (HCC)   Essential hypertension   Cerebral embolism with cerebral infarction    Discharge Instructions  Discharge Instructions  Diet - low sodium heart healthy   Complete by: As directed    Increase activity slowly   Complete by: As directed       Allergies as of 08/27/2021   No Known Allergies      Medication List     STOP taking  these medications    glipiZIDE 5 MG 24 hr tablet Commonly known as: GLUCOTROL XL   lisinopril 2.5 MG tablet Commonly known as: ZESTRIL   metFORMIN 1000 MG tablet Commonly known as: GLUCOPHAGE   naproxen sodium 550 MG tablet Commonly known as: ANAPROX   pioglitazone 30 MG tablet Commonly known as: ACTOS   pravastatin 40 MG tablet Commonly known as: PRAVACHOL   sitaGLIPtin 100 MG tablet Commonly known as: JANUVIA       TAKE these medications    apixaban 5 MG Tabs tablet Commonly known as: ELIQUIS Take 1 tablet (5 mg total) by mouth 2 (two) times daily.   aspirin EC 81 MG tablet Take 81 mg by mouth daily.   atorvastatin 80 MG tablet Commonly known as: LIPITOR Take 1 tablet (80 mg total) by mouth daily. Start taking on: August 28, 2021   carvedilol 3.125 MG tablet Commonly known as: COREG Take 1 tablet (3.125 mg total) by mouth 2 (two) times daily with a meal.   dapagliflozin propanediol 10 MG Tabs tablet Commonly known as: FARXIGA Take 1 tablet (10 mg total) by mouth daily.   insulin glargine-yfgn 100 UNIT/ML injection Commonly known as: SEMGLEE Inject 0.1 mLs (10 Units total) into the skin daily. Start taking on: August 28, 2021   sacubitril-valsartan 24-26 MG Commonly known as: ENTRESTO Take 1 tablet by mouth 2 (two) times daily.   spironolactone 25 MG tablet Commonly known as: ALDACTONE Take 0.5 tablets (12.5 mg total) by mouth daily. Start taking on: August 28, 2021   vitamin B-12 1000 MCG tablet Commonly known as: CYANOCOBALAMIN Take 1 tablet (1,000 mcg total) by mouth daily.        Follow-up Information     Rosalin Hawking, MD Follow up in 2 month(s).   Specialty: Neurology Contact information: [address] [place] [address] [phone]                No Known Allergies  Consultations: Cardiology Neurology    Procedures/Studies: CT ANGIO HEAD NECK W WO CM  Result Date: 08/24/2021 CLINICAL DATA:   Follow-up examination for stroke. EXAM: CT ANGIOGRAPHY HEAD AND NECK TECHNIQUE: Multidetector CT imaging of the head and neck was performed using the standard protocol during bolus administration of intravenous contrast. Multiplanar CT image reconstructions and MIPs were obtained to evaluate the vascular anatomy. Carotid stenosis measurements (when applicable) are obtained utilizing NASCET criteria, using the distal internal carotid diameter as the denominator. CONTRAST:  40m OMNIPAQUE IOHEXOL 350 MG/ML SOLN COMPARISON:  CT from 08/22/2021. FINDINGS: CT HEAD FINDINGS Brain: Atrophy with chronic microvascular ischemic disease. Parenchymal hypodensity seen involving the parasagittal left frontal lobe consistent with an evolving left ACA distribution infarct (series 5, image 24). This is slightly increased in conspicuity as compared to previous. Area of infarction measures approximately 4.1 cm in AP diameter. Suspected faint petechial blood products without intraparenchymal hematoma or significant mass effect. No other acute intracranial hemorrhage or large vessel territory infarct. No mass lesion or midline shift. No hydrocephalus or extra-axial fluid collection. Vascular: No hyperdense vessel. Calcified atherosclerosis at the skull base. Skull: Scalp soft tissues and calvarium are unchanged with no new acute abnormality.  Sinuses: Chronic right maxillary sinusitis. Small chronic appearing left mastoid effusion noted as well. Orbits: Globes and orbital soft tissues demonstrate no acute finding. Review of the MIP images confirms the above findings CTA NECK FINDINGS Aortic arch: Visualized aortic arch normal in caliber with normal branch pattern. Mild atheromatous change about the arch itself. No stenosis about the origin of the great vessels. Right carotid system: Right CCA widely patent to the bifurcation without stenosis. Eccentric calcified plaque at the proximal right ICA with associated stenosis of up to 50% by  NASCET criteria. Right ICA patent distally without stenosis or other abnormality. Left carotid system: Left CCA patent from its origin to the bifurcation without stenosis. Eccentric calcified plaque at the origin of the left ICA without hemodynamically significant stenosis. Left ICA patent distally without stenosis or other abnormality. Vertebral arteries: Both vertebral arteries arise from subclavian arteries. Left vertebral artery strongly dominant and widely patent within the neck. Right vertebral artery hypoplastic and largely occluded, with only scant intermittent thready flow seen within the neck. Right vertebral artery is essentially occluded at the skull base. Skeleton: No visible acute osseous finding. Multilevel cervical spondylosis without significant stenosis. Patient is edentulous. Other neck: No other acute abnormality within the neck. No mass or adenopathy. Upper chest: Large right with small left layering pleural effusions, partially visualized. Hazy ground-glass opacity with interlobular septal thickening within the visualized lungs consistent with pulmonary interstitial edema. Review of the MIP images confirms the above findings CTA HEAD FINDINGS Anterior circulation: Petrous segments patent bilaterally. Advanced atheromatous change throughout the carotid siphons bilaterally. Associated moderate multifocal narrowing on the left. There is a focal severe stenosis involving the supraclinoid right ICA (series 10, image 232). Left A1 segment widely patent. Moderate stenosis at the origin of the right A1 which is also slightly hypoplastic. Normal anterior communicating artery complex. Both ACAs are patent to their distal aspects without visible occlusion. No M1 stenosis or occlusion. Distal MCA branches well perfused and symmetric. Posterior circulation: Dominant left vertebral artery patent to the vertebrobasilar junction without significant stenosis. Left PICA patent. Hypoplastic right vertebral artery  occluded at the skull base. Slight retrograde filling into the distal right V4 segment. The right PICA is not definitely seen. Basilar patent to its distal aspect without stenosis. Superior cerebral arteries patent bilaterally. Both PCAs primarily supplied via the basilar well perfused to their distal aspects. Venous sinuses: Grossly patent allowing for timing the contrast bolus. Anatomic variants: Hypoplastic right vertebral artery. No visible aneurysm. Review of the MIP images confirms the above findings IMPRESSION: CT HEAD IMPRESSION: 1. Evolving acute left ACA distribution infarct involving the parasagittal left frontal lobe. Suspected faint petechial blood products without intraparenchymal hematoma or mass effect. 2. Otherwise stable head CT with underlying atrophy and chronic microvascular ischemic disease. CTA HEAD AND NECK IMPRESSION: 1. Negative CTA for large vessel occlusion. 2. Advanced atheromatous change about the carotid siphons with associated severe stenosis at the supraclinoid right ICA. More moderate multifocal narrowing on the left. 3. Short-segment 50% stenosis at the origin of the cervical right ICA. No significant stenosis about the left carotid artery system within the neck. 4. Occlusion of the hypoplastic right vertebral artery. Dominant left vertebral artery widely patent without significant stenosis. 5. Large right with small left layering pleural effusions with evidence for pulmonary interstitial edema. Electronically Signed   By: Jeannine Boga M.D.   On: 08/24/2021 02:02   DG Chest 1 View  Result Date: 08/22/2021 CLINICAL DATA:  Rule out metallic foreign  body prior to MRI. EXAM: CHEST  1 VIEW COMPARISON:  01/07/2019 FINDINGS: Remote lower left rib fractures. [place] projects over the left upper quadrant, similar to on the prior. There is also a radiopaque density of 6 mm which projects over the AP window region, similar to on the prior. Midline trachea. Cardiomegaly accentuated  by AP portable technique. Superior mediastinal soft tissue fullness is likely also due to AP portable technique. No pleural effusion or pneumothorax. Lung volumes are low, accentuating the pulmonary interstitium. Suspect concurrent right greater than left interstitial and airspace disease. IMPRESSION: Cardiomegaly with low lung volumes and suspicion of interstitial and airspace disease. Favor pulmonary edema. Multifocal infection could look similar. [place] projecting over the left upper quadrant with a single nonspecific radiopaque density projecting over the AP window region. Electronically Signed   By: Abigail Miyamoto M.D.   On: 08/22/2021 11:47   DG Pelvis 1-2 Views  Result Date: 08/22/2021 CLINICAL DATA:  Screening prior to MRI EXAM: PELVIS - 1-2 VIEW COMPARISON:  None. FINDINGS: A single metallic radiopaque density seen overlying the left iliac crest. No acute osseous abnormality. Vascular calcifications. IMPRESSION: A single metallic radiopaque density is seen overlying the left iliac crest. Please refer to report of same-day abdominal radiograph for recommendations regarding MRI safety. Electronically Signed   By: Albin Felling M.D.   On: 08/22/2021 11:53   DG Abd 1 View  Result Date: 08/22/2021 CLINICAL DATA:  Screening prior to MRI EXAM: ABDOMEN - 1 VIEW COMPARISON:  Chest radiograph November 2016 FINDINGS: Normal bowel gas pattern. Numerous metallic radiodensities are seen primarily clustered overlying the left upper abdomen. The osseous structures are unremarkable. Vascular calcifications. IMPRESSION: Numerous metallic radiodensities are seen primarily clustered overlying the left upper abdomen. In reviewing the patient's imaging history, the patient has had an MRI in August 2019. These radiodensities were present since at least November 2016 where there were seen on a chest radiograph. If the patient tolerated a previous MRI without difficulty, it is likely safe for the patient to undergo  subsequent MRIs. The patient should be monitored for any pain or discomfort during the examination. Electronically Signed   By: Albin Felling M.D.   On: 08/22/2021 11:51   CT HEAD WO CONTRAST  Result Date: 08/22/2021 CLINICAL DATA:  Neuro deficit, acute, stroke suspected EXAM: CT HEAD WITHOUT CONTRAST TECHNIQUE: Contiguous axial images were obtained from the base of the skull through the vertex without intravenous contrast. COMPARISON:  None. FINDINGS: Brain: There is atrophy and chronic small vessel disease changes. No acute intracranial abnormality. Specifically, no hemorrhage, hydrocephalus, mass lesion, acute infarction, or significant intracranial injury. Vascular: No hyperdense vessel or unexpected calcification. Skull: No acute calvarial abnormality. Sinuses/Orbits: No acute findings. Mucosal thickening in the maxillary sinuses. Other: None IMPRESSION: Atrophy, chronic microvascular disease. No acute intracranial abnormality. Electronically Signed   By: Rolm Baptise M.D.   On: 08/22/2021 02:17   ECHOCARDIOGRAM COMPLETE  Result Date: 08/23/2021    ECHOCARDIOGRAM REPORT   Patient Name:   VINNY COLEE Date of Exam: 08/23/2021 Medical Rec #:  [id number]      Height:       69.0 in Accession #:    [id number]     Weight:       135.0 lb Date of Birth:  04-24-1951      BSA:          1.748 m Patient Age:    39 years       BP:  133/90 mmHg Patient Gender: M              HR:           92 bpm. Exam Location:  Inpatient Procedure: 2D Echo, Cardiac Doppler, Color Doppler and Intracardiac            Opacification Agent  Results communicated to Dr Tyrell Antonio at 11:27. Indications:    CVA  History:        Patient has no prior history of Echocardiogram examinations.                 Risk Factors:Diabetes and Dyslipidemia.  Sonographer:    Luisa Hart RDCS Referring Phys: [id number] Laurel Harnden A Pearlie Nies  Sonographer Comments: Patient is morbidly obese, suboptimal parasternal window, suboptimal apical window and suboptimal  subcostal window. Image acquisition challenging due to patient body habitus. IMPRESSIONS  1. Left ventricular ejection fraction, by estimation, is 25 to 30%. The left ventricle has severely decreased function. The left ventricle demonstrates global hypokinesis with apical akinesis. There is moderate asymmetric left ventricular hypertrophy of  the basal-septal segment. Left ventricular diastolic parameters are consistent with Grade II diastolic dysfunction (pseudonormalization). Elevated left atrial pressure.  2. Right ventricular systolic function is normal. The right ventricular size is normal. Tricuspid regurgitation signal is inadequate for assessing PA pressure.  3. Left atrial size was mildly dilated.  4. The mitral valve is abnormal. No evidence of mitral valve regurgitation. Mild mitral stenosis. MG 67mHg at 88bpm, MVA 1.2 cm^2 by continuity equation. Moderate mitral annular calcification.  5. The aortic valve is calcified. There is moderate calcification of the aortic valve. Aortic valve regurgitation is not visualized. Mild to moderate aortic valve sclerosis/calcification is present, without any evidence of aortic stenosis. FINDINGS  Left Ventricle: Left ventricular ejection fraction, by estimation, is 25 to 30%. The left ventricle has severely decreased function. The left ventricle demonstrates global hypokinesis. Definity contrast agent was given IV to delineate the left ventricular endocardial borders. The left ventricular internal cavity size was normal in size. There is moderate asymmetric left ventricular hypertrophy of the basal-septal segment. Left ventricular diastolic parameters are consistent with Grade II diastolic dysfunction (pseudonormalization). Elevated left atrial pressure. Right Ventricle: The right ventricular size is normal. No increase in right ventricular wall thickness. Right ventricular systolic function is normal. Tricuspid regurgitation signal is inadequate for assessing PA  pressure. Left Atrium: Left atrial size was mildly dilated. Right Atrium: Right atrial size was normal in size. Pericardium: Trivial pericardial effusion is present. Mitral Valve: The mitral valve is abnormal. Moderate mitral annular calcification. No evidence of mitral valve regurgitation. Mild mitral valve stenosis. MV peak gradient, 6.2 mmHg. The mean mitral valve gradient is 3.0 mmHg. Tricuspid Valve: The tricuspid valve is normal in structure. Tricuspid valve regurgitation is trivial. Aortic Valve: The aortic valve is calcified. There is moderate calcification of the aortic valve. Aortic valve regurgitation is not visualized. Mild to moderate aortic valve sclerosis/calcification is present, without any evidence of aortic stenosis. Aortic valve mean gradient measures 3.0 mmHg. Aortic valve peak gradient measures 5.3 mmHg. Aortic valve area, by VTI measures 1.74 cm. Pulmonic Valve: The pulmonic valve was not well visualized. Pulmonic valve regurgitation is not visualized. Aorta: The aortic root and ascending aorta are structurally normal, with no evidence of dilitation. IAS/Shunts: The interatrial septum was not well visualized.  LEFT VENTRICLE PLAX 2D LVIDd:         4.60 cm  Diastology LVIDs:  4.30 cm  LV e' medial:    3.12 cm/s LV PW:         1.30 cm  LV E/e' medial:  31.6 LV IVS:        1.80 cm  LV e' lateral:   3.94 cm/s LVOT diam:     2.40 cm  LV E/e' lateral: 25.0 LV SV:         34 LV SV Index:   19 LVOT Area:     4.52 cm  RIGHT VENTRICLE RV Basal diam:  2.80 cm RV Mid diam:    1.90 cm RV S prime:     11.50 cm/s TAPSE (M-mode): 1.4 cm LEFT ATRIUM             Index       RIGHT ATRIUM           Index LA Vol (A2C):   60.3 ml 34.49 ml/m RA Area:     10.00 cm LA Vol (A4C):   60.6 ml 34.66 ml/m RA Volume:   22.20 ml  12.70 ml/m LA Biplane Vol: 63.1 ml 36.09 ml/m  AORTIC VALVE                   PULMONIC VALVE AV Area (Vmax):    1.75 cm    PV Vmax:       0.78 m/s AV Area (Vmean):   1.63 cm    PV  Vmean:      64.100 cm/s AV Area (VTI):     1.74 cm    PV VTI:        0.149 m AV Vmax:           115.00 cm/s PV Peak grad:  2.4 mmHg AV Vmean:          78.200 cm/s PV Mean grad:  2.0 mmHg AV VTI:            0.195 m AV Peak Grad:      5.3 mmHg AV Mean Grad:      3.0 mmHg LVOT Vmax:         44.60 cm/s LVOT Vmean:        28.100 cm/s LVOT VTI:          0.075 m LVOT/AV VTI ratio: 0.38  AORTA Ao Root diam: 3.60 cm Ao Asc diam:  3.20 cm MITRAL VALVE MV Area (PHT): 7.59 cm     SHUNTS MV Area VTI:   1.19 cm     Systemic VTI:  0.07 m MV Peak grad:  6.2 mmHg     Systemic Diam: 2.40 cm MV Mean grad:  3.0 mmHg MV Vmax:       1.25 m/s MV Vmean:      76.4 cm/s MV Decel Time: 100 msec MV E velocity: 98.50 cm/s MV A velocity: 119.00 cm/s MV E/A ratio:  0.83 Oswaldo Milian MD Electronically signed by Oswaldo Milian MD Signature Date/Time: 08/23/2021/11:30:45 AM    Final    ECHO TEE  Result Date: 08/26/2021    TRANSESOPHOGEAL ECHO REPORT   Patient Name:   SHAIN CERASOLI Date of Exam: 08/26/2021 Medical Rec #:  [id number]      Height:       69.0 in Accession #:    [id number]     Weight:       135.0 lb Date of Birth:  16-Jan-1951      BSA:          1.748 m Patient Age:  70 years       BP:           101/79 mmHg Patient Gender: M              HR:           59 bpm. Exam Location:  Inpatient Procedure: 3D Echo, Transesophageal Echo, Cardiac Doppler and Color Doppler Indications:     I25.110 Atherosclerotic heart disease of native coronary artery                  with unstable angina pectoris  History:         Patient has prior history of Echocardiogram examinations, most                  recent 08/23/2021. Stroke; Risk Factors:Hypertension,                  Dyslipidemia and Diabetes.  Sonographer:     Roseanna Rainbow RDCS Referring Phys:  [id number] AMY D CLEGG Diagnosing Phys: Franki Monte PROCEDURE: After discussion of the risks and benefits of a TEE, an informed consent was obtained from the patient. The transesophogeal probe was  passed without difficulty through the esophogus of the patient. Imaged were obtained with the patient in a left lateral decubitus position. Sedation performed by different physician. The patient was monitored while under deep sedation. Anesthestetic sedation was provided intravenously by Anesthesiology: '59mg'$  of Propofol. The patient's vital signs; including heart rate, blood pressure, and oxygen saturation; remained stable throughout the procedure. The patient developed no complications during the procedure. IMPRESSIONS  1. Left ventricular ejection fraction, by estimation, is 30 to 35%. The left ventricle has normal function. The left ventricle demonstrates regional wall motion abnormalities with peri-apical severe hypokinesis to akinesis.  2. Right ventricular systolic function is normal. The right ventricular size is normal.  3. Left atrial size was mildly dilated. A left atrial/left atrial appendage thrombus was detected. There was a large up to 2.5 cm LA appendage thrombus present.  4. The mitral valve is normal in structure. No evidence of mitral valve regurgitation. No evidence of mitral stenosis.  5. The aortic valve is tricuspid. Aortic valve regurgitation is not visualized. Mild aortic valve sclerosis is present, with no evidence of aortic valve stenosis.  6. No PFO or ASD by color doppler. FINDINGS  Left Ventricle: Left ventricular ejection fraction, by estimation, is 30 to 35%. The left ventricle has normal function. The left ventricle demonstrates regional wall motion abnormalities. The left ventricular internal cavity size was normal in size. Right Ventricle: The right ventricular size is normal. No increase in right ventricular wall thickness. Right ventricular systolic function is normal. Left Atrium: Left atrial size was mildly dilated. A left atrial/left atrial appendage thrombus was detected. Right Atrium: Right atrial size was normal in size. Pericardium: There is no evidence of pericardial  effusion. Mitral Valve: The mitral valve is normal in structure. Mild mitral annular calcification. No evidence of mitral valve regurgitation. No evidence of mitral valve stenosis. Tricuspid Valve: The tricuspid valve is normal in structure. Tricuspid valve regurgitation is not demonstrated. Aortic Valve: The aortic valve is tricuspid. Aortic valve regurgitation is not visualized. Mild aortic valve sclerosis is present, with no evidence of aortic valve stenosis. Pulmonic Valve: The pulmonic valve was normal in structure. Pulmonic valve regurgitation is not visualized. Aorta: The aortic root is normal in size and structure. IAS/Shunts: No PFO or ASD by color doppler. [employer] Electronically signed by Franki Monte  Signature Date/Time: 08/26/2021/5:53:00 PM    Final     Subjective: He is feeling better, denies dyspnea. Right side weakness  Discharge Exam: Vitals:   08/27/21 0812 08/27/21 1151  BP: 116/75 105/73  Pulse: 95 90  Resp: 20 (!) 21  Temp: 98.6 F (37 C) 98.3 F (36.8 C)  SpO2: 94% 95%     General: Pt is alert, awake, not in acute distress Cardiovascular: RRR, S1/S2 +, no rubs, no gallops Respiratory: CTA bilaterally, no wheezing, no rhonchi Abdominal: Soft, NT, ND, bowel sounds + Extremities: right side weakness    The results of significant diagnostics from this hospitalization (including imaging, microbiology, ancillary and laboratory) are listed below for reference.     Microbiology: Recent Results (from the past 240 hour(s))  Resp Panel by RT-PCR (Flu A&B, Covid) Nasopharyngeal Swab     Status: None   Collection Time: 08/22/21  1:13 AM   Specimen: Nasopharyngeal Swab; Nasopharyngeal(NP) swabs in vial transport medium  Result Value Ref Range Status   SARS Coronavirus 2 by RT PCR NEGATIVE NEGATIVE Final    Comment: (NOTE) SARS-CoV-2 target nucleic acids are NOT DETECTED.  The SARS-CoV-2 RNA is generally detectable in upper respiratory specimens during the  acute phase of infection. The lowest concentration of SARS-CoV-2 viral copies this assay can detect is 138 copies/mL. A negative result does not preclude SARS-Cov-2 infection and should not be used as the sole basis for treatment or other patient management decisions. A negative result may occur with  improper specimen collection/handling, submission of specimen other than nasopharyngeal swab, presence of viral mutation(s) within the areas targeted by this assay, and inadequate number of viral copies(<138 copies/mL). A negative result must be combined with clinical observations, patient history, and epidemiological information. The expected result is Negative.  Fact Sheet for Patients:  EntrepreneurPulse.com.au  Fact Sheet for Healthcare Providers:  IncredibleEmployment.be  This test is no t yet approved or cleared by the [place] FDA and  has been authorized for detection and/or diagnosis of SARS-CoV-2 by FDA under an Emergency Use Authorization (EUA). This EUA will remain  in effect (meaning this test can be used) for the duration of the COVID-19 declaration under Section 564(b)(1) of the Act, 21 U.S.C.section 360bbb-3(b)(1), unless the authorization is terminated  or revoked sooner.       Influenza A by PCR NEGATIVE NEGATIVE Final   Influenza B by PCR NEGATIVE NEGATIVE Final    Comment: (NOTE) The Xpert Xpress SARS-CoV-2/FLU/RSV plus assay is intended as an aid in the diagnosis of influenza from Nasopharyngeal swab specimens and should not be used as a sole basis for treatment. Nasal washings and aspirates are unacceptable for Xpert Xpress SARS-CoV-2/FLU/RSV testing.  Fact Sheet for Patients: EntrepreneurPulse.com.au  Fact Sheet for Healthcare Providers: IncredibleEmployment.be  This test is not yet approved or cleared by the [place] FDA and has been authorized for detection and/or  diagnosis of SARS-CoV-2 by FDA under an Emergency Use Authorization (EUA). This EUA will remain in effect (meaning this test can be used) for the duration of the COVID-19 declaration under Section 564(b)(1) of the Act, 21 U.S.C. section 360bbb-3(b)(1), unless the authorization is terminated or revoked.  Performed at [facility], [place] [address] [place], [place] [address]      Labs: BNP (last 3 results) No results for input(s): BNP in the last 8760 hours. Basic Metabolic Panel: Recent Labs  Lab 08/23/21 0745 08/24/21 0840 08/25/21 0245 08/26/21 0332 08/27/21 0354  NA 137 136 136  134* 131*  K 3.6 3.6 3.1* 3.5 3.5  CL 106 108 105 102 99  CO2 23 21* 23 21* 20*  GLUCOSE 149* 172* 138* 149* 112*  BUN '9 10 9 11 10  '$ CREATININE 0.85 1.00 0.95 1.03 1.03  CALCIUM 8.8* 9.0 9.1 8.7* 8.5*   Liver Function Tests: Recent Labs  Lab 08/22/21 0119  AST 15  ALT 15  ALKPHOS 79  BILITOT 1.0  PROT 6.9  ALBUMIN 2.7*   No results for input(s): LIPASE, AMYLASE in the last 168 hours. Recent Labs  Lab 08/23/21 0104  AMMONIA 19   CBC: Recent Labs  Lab 08/22/21 0119 08/22/21 0126 08/22/21 0601 08/23/21 0745 08/24/21 0840 08/27/21 0354  WBC 8.8  --  8.6 7.6 8.3 6.6  NEUTROABS 6.4  --   --   --   --   --   HGB 13.4 13.6 13.0 13.0 13.3 13.1  HCT 39.7 40.0 38.4* 39.8 40.6 38.9*  MCV 96.8  --  96.2 97.5 98.1 96.5  PLT 304  --  313 318 312 258   Cardiac Enzymes: No results for input(s): CKTOTAL, CKMB, CKMBINDEX, TROPONINI in the last 168 hours. BNP: Invalid input(s): POCBNP CBG: Recent Labs  Lab 08/26/21 2016 08/26/21 2324 08/27/21 0403 08/27/21 0814 08/27/21 1153  GLUCAP 132* 121* 121* 113* 132*   D-Dimer No results for input(s): DDIMER in the last 72 hours. Hgb A1c No results for input(s): HGBA1C in the last 72 hours. Lipid Profile No results for input(s): CHOL, HDL, LDLCALC, TRIG, CHOLHDL, LDLDIRECT in the last 72 hours. Thyroid function studies No results  for input(s): TSH, T4TOTAL, T3FREE, THYROIDAB in the last 72 hours.  Invalid input(s): FREET3 Anemia work up No results for input(s): VITAMINB12, FOLATE, FERRITIN, TIBC, IRON, RETICCTPCT in the last 72 hours. Urinalysis    Component Value Date/Time   COLORURINE YELLOW 08/22/2021 0816   APPEARANCEUR HAZY (A) 08/22/2021 0816   LABSPEC 1.027 08/22/2021 0816   PHURINE 5.0 08/22/2021 0816   GLUCOSEU >=500 (A) 08/22/2021 0816   HGBUR MODERATE (A) 08/22/2021 0816   BILIRUBINUR NEGATIVE 08/22/2021 0816   KETONESUR 20 (A) 08/22/2021 0816   PROTEINUR 30 (A) 08/22/2021 0816   NITRITE NEGATIVE 08/22/2021 0816   LEUKOCYTESUR NEGATIVE 08/22/2021 0816   Sepsis Labs Invalid input(s): PROCALCITONIN,  WBC,  LACTICIDVEN Microbiology Recent Results (from the past 240 hour(s))  Resp Panel by RT-PCR (Flu A&B, Covid) Nasopharyngeal Swab     Status: None   Collection Time: 08/22/21  1:13 AM   Specimen: Nasopharyngeal Swab; Nasopharyngeal(NP) swabs in vial transport medium  Result Value Ref Range Status   SARS Coronavirus 2 by RT PCR NEGATIVE NEGATIVE Final    Comment: (NOTE) SARS-CoV-2 target nucleic acids are NOT DETECTED.  The SARS-CoV-2 RNA is generally detectable in upper respiratory specimens during the acute phase of infection. The lowest concentration of SARS-CoV-2 viral copies this assay can detect is 138 copies/mL. A negative result does not preclude SARS-Cov-2 infection and should not be used as the sole basis for treatment or other patient management decisions. A negative result may occur with  improper specimen collection/handling, submission of specimen other than nasopharyngeal swab, presence of viral mutation(s) within the areas targeted by this assay, and inadequate number of viral copies(<138 copies/mL). A negative result must be combined with clinical observations, patient history, and epidemiological information. The expected result is Negative.  Fact Sheet for Patients:   EntrepreneurPulse.com.au  Fact Sheet for Healthcare Providers:  IncredibleEmployment.be  This test is no  t yet approved or cleared by the [place] and  has been authorized for detection and/or diagnosis of SARS-CoV-2 by FDA under an Emergency Use Authorization (EUA). This EUA will remain  in effect (meaning this test can be used) for the duration of the COVID-19 declaration under Section 564(b)(1) of the Act, 21 U.S.C.section 360bbb-3(b)(1), unless the authorization is terminated  or revoked sooner.       Influenza A by PCR NEGATIVE NEGATIVE Final   Influenza B by PCR NEGATIVE NEGATIVE Final    Comment: (NOTE) The Xpert Xpress SARS-CoV-2/FLU/RSV plus assay is intended as an aid in the diagnosis of influenza from Nasopharyngeal swab specimens and should not be used as a sole basis for treatment. Nasal washings and aspirates are unacceptable for Xpert Xpress SARS-CoV-2/FLU/RSV testing.  Fact Sheet for Patients: EntrepreneurPulse.com.au  Fact Sheet for Healthcare Providers: IncredibleEmployment.be  This test is not yet approved or cleared by the [place] FDA and has been authorized for detection and/or diagnosis of SARS-CoV-2 by FDA under an Emergency Use Authorization (EUA). This EUA will remain in effect (meaning this test can be used) for the duration of the COVID-19 declaration under Section 564(b)(1) of the Act, 21 U.S.C. section 360bbb-3(b)(1), unless the authorization is terminated or revoked.  Performed at [facility], [place] [address] [place], [place] [address]      Time coordinating discharge: 40 minutes  SIGNED:   Elmarie Shiley, MD  [facility]

## 2021-08-27 NOTE — Plan of Care (Signed)
  Problem: Education: Goal: Knowledge of General Education information will improve Description: Including pain rating scale, medication(s)/side effects and non-pharmacologic comfort measures Outcome: Progressing   Problem: Health Behavior/Discharge Planning: Goal: Ability to manage health-related needs will improve Outcome: Progressing   Problem: Clinical Measurements: Goal: Ability to maintain clinical measurements within normal limits will improve Outcome: Progressing Goal: Will remain free from infection Outcome: Progressing Goal: Diagnostic test results will improve Outcome: Progressing Goal: Respiratory complications will improve Outcome: Progressing Goal: Cardiovascular complication will be avoided Outcome: Progressing   Problem: Activity: Goal: Risk for activity intolerance will decrease Outcome: Progressing   Problem: Nutrition: Goal: Adequate nutrition will be maintained Outcome: Progressing   Problem: Coping: Goal: Level of anxiety will decrease Outcome: Progressing   Problem: Elimination: Goal: Will not experience complications related to bowel motility Outcome: Progressing Goal: Will not experience complications related to urinary retention Outcome: Progressing   Problem: Pain Managment: Goal: General experience of comfort will improve Outcome: Progressing   Problem: Safety: Goal: Ability to remain free from injury will improve Outcome: Progressing   Problem: Skin Integrity: Goal: Risk for impaired skin integrity will decrease Outcome: Progressing   Problem: Education: Goal: Knowledge of disease or condition will improve Outcome: Progressing Goal: Knowledge of secondary prevention will improve Outcome: Progressing Goal: Knowledge of patient specific risk factors addressed and post discharge goals established will improve Outcome: Progressing Goal: Individualized Educational Video(s) Outcome: Progressing   Problem: Health Behavior/Discharge  Planning: Goal: Ability to manage health-related needs will improve Outcome: Progressing   Problem: Self-Care: Goal: Ability to participate in self-care as condition permits will improve Outcome: Progressing Goal: Ability to communicate needs accurately will improve Outcome: Progressing   Problem: Nutrition: Goal: Risk of aspiration will decrease Outcome: Progressing   Problem: Ischemic Stroke/TIA Tissue Perfusion: Goal: Complications of ischemic stroke/TIA will be minimized Outcome: Progressing

## 2021-08-27 NOTE — Discharge Instructions (Signed)
Information on my medicine - ELIQUIS (apixaban)  Why was Eliquis prescribed for you? Eliquis was prescribed to treat blood clots that may have been found in your heart and to reduce the risk of stroke occurring again.  What do You need to know about Eliquis ? The dose is ONE 5 mg tablet taken TWICE daily.  Eliquis may be taken with or without food.   Try to take the dose about the same time in the morning and in the evening. If you have difficulty swallowing the tablet whole please discuss with your pharmacist how to take the medication safely.  Take Eliquis exactly as prescribed and DO NOT stop taking Eliquis without talking to the doctor who prescribed the medication.  Stopping may increase your risk of developing a new blood clot.  Refill your prescription before you run out.  After discharge, you should have regular check-up appointments with your healthcare provider that is prescribing your Eliquis.    What do you do if you miss a dose? If a dose of ELIQUIS is not taken at the scheduled time, take it as soon as possible on the same day and twice-daily administration should be resumed. The dose should not be doubled to make up for a missed dose.  Important Safety Information A possible side effect of Eliquis is bleeding. You should call your healthcare provider right away if you experience any of the following: Bleeding from an injury or your nose that does not stop. Unusual colored urine (red or dark brown) or unusual colored stools (red or black). Unusual bruising for unknown reasons. A serious fall or if you hit your head (even if there is no bleeding).  Some medicines may interact with Eliquis and might increase your risk of bleeding or clotting while on Eliquis. To help avoid this, consult your healthcare provider or pharmacist prior to using any new prescription or non-prescription medications, including herbals, vitamins, non-steroidal anti-inflammatory drugs (NSAIDs)  and supplements.  This website has more information on Eliquis (apixaban): http://www.eliquis.com/eliquis/home
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
## 2021-08-27 NOTE — TOC Progression Note (Signed)
Transition of Care [facility]) - Progression Note    Patient Details  Name: Shawn [name] MRN: [id number] Date of Birth: 10/19/1951  Transition of Care [facility]) CM/SW [place], [place] Phone Number: 08/27/2021, 11:38 AM  Clinical Narrative:    CSW spoke with Arbie Cookey [phone] at [place] to update her about the PT note that was just submitted for Mr. Cockett and if she wouldn't mind starting the insurance authorization for the patient and she agreed. Arbie Cookey will update once the insurance authorization is back.  CSW will continue to follow throughout discharge.   Expected Discharge Plan: Skilled Nursing Facility Barriers to Discharge: Continued Medical Work up, Ship broker  Expected Discharge Plan and Services Expected Discharge Plan: [place]     Post Acute Care Choice: NA Living arrangements for the past 2 months: Apartment                                       Social Determinants of Health (SDOH) Interventions    Readmission Risk Interventions No flowsheet data found.  Urie Loughner, MSW, [place] Heart Failure Social Worker

## 2021-08-27 NOTE — Consult Note (Signed)
   [facility] Inpatient Consult   08/27/2021  REI MEDLEN 05-08-1951 [id number]  [place] Organization [ACO] Patient: [facility] Medicare   Patient is currently active with [place] [facility] for chronic disease management services.  Patient has been engaged by a [facility].  Our community based plan of care has focused on disease management and community resource support.    Plan:  Patient is being recommended for a skilled nursing level of care. Will alert [place] Coordinator of transition in which patient post hospital needs are to be met at a skilled nursing facility level of care for rehab.  Of note, [facility] Care Management services does not replace or interfere with any services that are needed or arranged by inpatient [facility] care management team.  For additional questions or referrals please contact:  Natividad Brood, RN BSN [facility] Liaison  [phone] business mobile phone Toll free office [phone]  Fax number: [phone] [email or web address]

## 2021-08-27 NOTE — TOC Progression Note (Addendum)
Transition of Care [facility]) - Progression Note    Patient Details  Name: ROGAN ASBURY MRN: [id number] Date of Birth: [birth year]  Transition of Care [facility]) CM/SW Contact  Joanne Chars, LCSW Phone Number: 08/27/2021, 8:43 AM  Clinical Narrative:   CSW spoke with Arbie Cookey at [facility] (cell: [phone]),  who does make bed offer.  CSW spoke with pt in his room and also with daughter Overton Mam and both are in agreement to accept.  Confirmed with Arbie Cookey, still need auth, tentative plan for admit on Wed, 9/14.   0925: TC call from [facility]: they do not manage this policy.  CSW spoke with Arbie Cookey at [place] and informed her.  Most recent PT note is 9/9, she will wait to initiate auth until new PT note.      Expected Discharge Plan: Skilled Nursing Facility Barriers to Discharge: Continued Medical Work up, Ship broker  Expected Discharge Plan and Services Expected Discharge Plan: [place]     Post Acute Care Choice: NA Living arrangements for the past 2 months: Apartment                                       Social Determinants of Health (SDOH) Interventions    Readmission Risk Interventions No flowsheet data found.

## 2021-08-28 ENCOUNTER — Inpatient Hospital Stay (HOSPITAL_COMMUNITY): Payer: Medicare HMO

## 2021-08-28 DIAGNOSIS — I255 Ischemic cardiomyopathy: Secondary | ICD-10-CM | POA: Diagnosis not present

## 2021-08-28 LAB — BASIC METABOLIC PANEL
Anion gap: 15 (ref 5–15)
BUN: 15 mg/dL (ref 8–23)
CO2: 22 mmol/L (ref 22–32)
Calcium: 9 mg/dL (ref 8.9–10.3)
Chloride: 97 mmol/L — ABNORMAL LOW (ref 98–111)
Creatinine, Ser: 1.18 mg/dL (ref 0.61–1.24)
GFR, Estimated: >60 mL/min (ref >60)
Glucose, Bld: 133 mg/dL — ABNORMAL HIGH (ref 70–99)
Potassium: 3.6 mmol/L (ref 3.5–5.1)
Sodium: 134 mmol/L — ABNORMAL LOW (ref 135–145)

## 2021-08-28 LAB — URINALYSIS, ROUTINE W REFLEX MICROSCOPIC
Bilirubin Urine: NEGATIVE
Glucose, UA: >=500 mg/dL — AB
Ketones, ur: 40 mg/dL — AB
Nitrite: POSITIVE — AB
Protein, ur: NEGATIVE mg/dL
Specific Gravity, Urine: 1.015 (ref 1.005–1.030)
pH: 5.5 (ref 5.0–8.0)

## 2021-08-28 LAB — CBC
HCT: 38.5 % — ABNORMAL LOW (ref 39.0–52.0)
Hemoglobin: 13.4 g/dL (ref 13.0–17.0)
MCH: 33.3 pg (ref 26.0–34.0)
MCHC: 34.8 g/dL (ref 30.0–36.0)
MCV: 95.5 fL (ref 80.0–100.0)
Platelets: 262 K/uL (ref 150–400)
RBC: 4.03 MIL/uL — ABNORMAL LOW (ref 4.22–5.81)
RDW: 14.7 % (ref 11.5–15.5)
WBC: 8.7 K/uL (ref 4.0–10.5)
nRBC: 0 % (ref 0.0–0.2)

## 2021-08-28 LAB — URINALYSIS, MICROSCOPIC (REFLEX)
RBC / HPF: NONE SEEN RBC/hpf (ref 0–5)
Squamous Epithelial / HPF: NONE SEEN (ref 0–5)

## 2021-08-28 LAB — GLUCOSE, CAPILLARY
Glucose-Capillary: 105 mg/dL — ABNORMAL HIGH (ref 70–99)
Glucose-Capillary: 132 mg/dL — ABNORMAL HIGH (ref 70–99)
Glucose-Capillary: 145 mg/dL — ABNORMAL HIGH (ref 70–99)
Glucose-Capillary: 147 mg/dL — ABNORMAL HIGH (ref 70–99)
Glucose-Capillary: 166 mg/dL — ABNORMAL HIGH (ref 70–99)

## 2021-08-28 MED ORDER — POTASSIUM CHLORIDE CRYS ER 20 MEQ PO TBCR
20.0000 meq | EXTENDED_RELEASE_TABLET | Freq: Once | ORAL | Status: AC
  Administered 2021-08-28: 20 meq via ORAL
  Filled 2021-08-28: qty 1

## 2021-08-28 MED ORDER — SPIRONOLACTONE 12.5 MG HALF TABLET
12.5000 mg | ORAL_TABLET | Freq: Once | ORAL | Status: AC
  Administered 2021-08-28: 12.5 mg via ORAL
  Filled 2021-08-28: qty 1

## 2021-08-28 MED ORDER — SPIRONOLACTONE 25 MG PO TABS
25.0000 mg | ORAL_TABLET | Freq: Every day | ORAL | Status: DC
  Administered 2021-08-29: 25 mg via ORAL
  Filled 2021-08-28: qty 1

## 2021-08-28 NOTE — TOC Progression Note (Addendum)
Transition of Care [facility]) - Progression Note    Patient Details  Name: Shawn [name] MRN: [id number] Date of Birth: Jul 21, 1951  Transition of Care [facility]) CM/SW [place], [place] Phone Number: 08/28/2021, 9:29 AM  Clinical Narrative:    CSW reached out to Arbie Cookey [phone] at [place] to ask about the insurance authorization approval however she did not answer the phone and CSW left a voicemail for her to return the call. CSW will continue to outreach the SNF. CSW received a call back from [place] at [facility] who reported that the insurance authorization did come back and will be in room 212B. CSW reached out to attending MD to update and requested rapid COVID test. Attending MD reported [name] to have a fever and to see about d/c tomorrow for [name]. CSW notified Arbie Cookey at [facility] of possible d/c tomorrow for [name] due to current fever today.  CSW has attempted to contact family for [name] and was only able to get in touch with Mr. Gentleman mother, Shawn [name] [phone] who is elderly and provided me with his daughter, Shawn [name] number (incorrect too many numbers) [phone]. CSW asked the patient's mother twice to repeat the phone number and both times she gave the same exact number. CSW called every number listed on the patient's chart and hasn't had any luck getting in touch with any family at this time. 2:28pm - CSW received a call back from the patient's daughter, Shawn [name] [phone] who lives and works in [place], [place] and she will be able to sign paperwork for the SNF via her e-mail [email or web address]. The patient's daughter, Shawn [name] work phone is [phone] and cell phone is [phone]. CSW updated [facility] about the patient's discharge plan.  CSW will continue to follow throughout discharge.  Expected Discharge Plan: [place] Barriers to Discharge: Continued Medical Work up, Ship broker  Expected  Discharge Plan and Services Expected Discharge Plan: [place] Acute Care Choice: NA Living arrangements for the past 2 months: Apartment Expected Discharge Date: 08/27/21                                     Social Determinants of Health (SDOH) Interventions    Readmission Risk Interventions No flowsheet data found.  Alvilda Mckenna, MSW, [place] Heart Failure Social Worker
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
## 2021-08-28 NOTE — Plan of Care (Signed)
  Problem: Education: Goal: Knowledge of General Education information will improve Description: Including pain rating scale, medication(s)/side effects and non-pharmacologic comfort measures Outcome: Progressing   Problem: Health Behavior/Discharge Planning: Goal: Ability to manage health-related needs will improve Outcome: Progressing   Problem: Clinical Measurements: Goal: Ability to maintain clinical measurements within normal limits will improve Outcome: Progressing Goal: Will remain free from infection Outcome: Progressing Goal: Diagnostic test results will improve Outcome: Progressing Goal: Respiratory complications will improve Outcome: Progressing Goal: Cardiovascular complication will be avoided Outcome: Progressing   Problem: Nutrition: Goal: Adequate nutrition will be maintained Outcome: Progressing   Problem: Coping: Goal: Level of anxiety will decrease Outcome: Progressing   Problem: Elimination: Goal: Will not experience complications related to bowel motility Outcome: Progressing Goal: Will not experience complications related to urinary retention Outcome: Progressing   Problem: Education: Goal: Knowledge of disease or condition will improve Outcome: Progressing Goal: Knowledge of secondary prevention will improve Outcome: Progressing Goal: Knowledge of patient specific risk factors addressed and post discharge goals established will improve Outcome: Progressing Goal: Individualized Educational Video(s) Outcome: Progressing   Problem: Nutrition: Goal: Risk of aspiration will decrease Outcome: Progressing   Problem: Ischemic Stroke/TIA Tissue Perfusion: Goal: Complications of ischemic stroke/TIA will be minimized Outcome: Progressing
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
## 2021-08-29 ENCOUNTER — Inpatient Hospital Stay (HOSPITAL_COMMUNITY)
Admission: EM | Admit: 2021-08-29 | Discharge: 2021-09-10 | DRG: 178 | Disposition: A | Discharge status: Skilled Nursing Facility | Payer: Medicare HMO | Source: Skilled Nursing Facility | Attending: Internal Medicine | Admitting: Internal Medicine

## 2021-08-29 ENCOUNTER — Other Ambulatory Visit: Payer: Self-pay

## 2021-08-29 ENCOUNTER — Encounter (HOSPITAL_COMMUNITY): Payer: Self-pay | Admitting: Emergency Medicine

## 2021-08-29 DIAGNOSIS — Z794 Long term (current) use of insulin: Secondary | ICD-10-CM | POA: Diagnosis not present

## 2021-08-29 DIAGNOSIS — E1165 Type 2 diabetes mellitus with hyperglycemia: Secondary | ICD-10-CM | POA: Diagnosis not present

## 2021-08-29 DIAGNOSIS — I63422 Cerebral infarction due to embolism of left anterior cerebral artery: Secondary | ICD-10-CM | POA: Diagnosis not present

## 2021-08-29 DIAGNOSIS — Z7982 Long term (current) use of aspirin: Secondary | ICD-10-CM | POA: Diagnosis not present

## 2021-08-29 DIAGNOSIS — U071 COVID-19: Principal | ICD-10-CM | POA: Diagnosis present

## 2021-08-29 DIAGNOSIS — I255 Ischemic cardiomyopathy: Secondary | ICD-10-CM | POA: Diagnosis present

## 2021-08-29 DIAGNOSIS — I110 Hypertensive heart disease with heart failure: Secondary | ICD-10-CM | POA: Diagnosis present

## 2021-08-29 DIAGNOSIS — Z7901 Long term (current) use of anticoagulants: Secondary | ICD-10-CM | POA: Diagnosis not present

## 2021-08-29 DIAGNOSIS — E875 Hyperkalemia: Secondary | ICD-10-CM | POA: Diagnosis present

## 2021-08-29 DIAGNOSIS — M6281 Muscle weakness (generalized): Secondary | ICD-10-CM | POA: Diagnosis not present

## 2021-08-29 DIAGNOSIS — E872 Acidosis: Secondary | ICD-10-CM | POA: Diagnosis present

## 2021-08-29 DIAGNOSIS — I69322 Dysarthria following cerebral infarction: Secondary | ICD-10-CM | POA: Diagnosis not present

## 2021-08-29 DIAGNOSIS — R4182 Altered mental status, unspecified: Secondary | ICD-10-CM | POA: Diagnosis not present

## 2021-08-29 DIAGNOSIS — N390 Urinary tract infection, site not specified: Secondary | ICD-10-CM | POA: Diagnosis present

## 2021-08-29 DIAGNOSIS — D72829 Elevated white blood cell count, unspecified: Secondary | ICD-10-CM

## 2021-08-29 DIAGNOSIS — R41841 Cognitive communication deficit: Secondary | ICD-10-CM | POA: Diagnosis not present

## 2021-08-29 DIAGNOSIS — I10 Essential (primary) hypertension: Secondary | ICD-10-CM | POA: Diagnosis not present

## 2021-08-29 DIAGNOSIS — D6489 Other specified anemias: Secondary | ICD-10-CM | POA: Diagnosis present

## 2021-08-29 DIAGNOSIS — E871 Hypo-osmolality and hyponatremia: Secondary | ICD-10-CM | POA: Diagnosis present

## 2021-08-29 DIAGNOSIS — N179 Acute kidney failure, unspecified: Secondary | ICD-10-CM | POA: Diagnosis present

## 2021-08-29 DIAGNOSIS — R5381 Other malaise: Secondary | ICD-10-CM | POA: Diagnosis not present

## 2021-08-29 DIAGNOSIS — I2510 Atherosclerotic heart disease of native coronary artery without angina pectoris: Secondary | ICD-10-CM | POA: Diagnosis not present

## 2021-08-29 DIAGNOSIS — Z79899 Other long term (current) drug therapy: Secondary | ICD-10-CM | POA: Diagnosis not present

## 2021-08-29 DIAGNOSIS — Z8673 Personal history of transient ischemic attack (TIA), and cerebral infarction without residual deficits: Secondary | ICD-10-CM | POA: Diagnosis not present

## 2021-08-29 DIAGNOSIS — E876 Hypokalemia: Secondary | ICD-10-CM | POA: Diagnosis present

## 2021-08-29 DIAGNOSIS — R509 Fever, unspecified: Secondary | ICD-10-CM

## 2021-08-29 DIAGNOSIS — E785 Hyperlipidemia, unspecified: Secondary | ICD-10-CM

## 2021-08-29 DIAGNOSIS — I513 Intracardiac thrombosis, not elsewhere classified: Secondary | ICD-10-CM | POA: Diagnosis present

## 2021-08-29 DIAGNOSIS — I959 Hypotension, unspecified: Secondary | ICD-10-CM | POA: Diagnosis present

## 2021-08-29 DIAGNOSIS — K5900 Constipation, unspecified: Secondary | ICD-10-CM | POA: Diagnosis present

## 2021-08-29 DIAGNOSIS — R404 Transient alteration of awareness: Secondary | ICD-10-CM | POA: Diagnosis not present

## 2021-08-29 DIAGNOSIS — D72828 Other elevated white blood cell count: Secondary | ICD-10-CM | POA: Diagnosis present

## 2021-08-29 DIAGNOSIS — Z89612 Acquired absence of left leg above knee: Secondary | ICD-10-CM | POA: Diagnosis not present

## 2021-08-29 DIAGNOSIS — I63442 Cerebral infarction due to embolism of left cerebellar artery: Secondary | ICD-10-CM | POA: Diagnosis not present

## 2021-08-29 DIAGNOSIS — R531 Weakness: Secondary | ICD-10-CM | POA: Diagnosis not present

## 2021-08-29 DIAGNOSIS — G459 Transient cerebral ischemic attack, unspecified: Secondary | ICD-10-CM | POA: Diagnosis not present

## 2021-08-29 DIAGNOSIS — I5042 Chronic combined systolic (congestive) and diastolic (congestive) heart failure: Secondary | ICD-10-CM | POA: Diagnosis present

## 2021-08-29 DIAGNOSIS — N189 Chronic kidney disease, unspecified: Secondary | ICD-10-CM | POA: Diagnosis not present

## 2021-08-29 DIAGNOSIS — Z89511 Acquired absence of right leg below knee: Secondary | ICD-10-CM

## 2021-08-29 DIAGNOSIS — I739 Peripheral vascular disease, unspecified: Secondary | ICD-10-CM | POA: Diagnosis present

## 2021-08-29 DIAGNOSIS — I639 Cerebral infarction, unspecified: Secondary | ICD-10-CM

## 2021-08-29 DIAGNOSIS — E7800 Pure hypercholesterolemia, unspecified: Secondary | ICD-10-CM | POA: Diagnosis present

## 2021-08-29 DIAGNOSIS — Z66 Do not resuscitate: Secondary | ICD-10-CM | POA: Diagnosis present

## 2021-08-29 DIAGNOSIS — E1151 Type 2 diabetes mellitus with diabetic peripheral angiopathy without gangrene: Secondary | ICD-10-CM | POA: Diagnosis present

## 2021-08-29 DIAGNOSIS — R0602 Shortness of breath: Secondary | ICD-10-CM | POA: Diagnosis not present

## 2021-08-29 DIAGNOSIS — I69351 Hemiplegia and hemiparesis following cerebral infarction affecting right dominant side: Secondary | ICD-10-CM | POA: Diagnosis not present

## 2021-08-29 DIAGNOSIS — Z9181 History of falling: Secondary | ICD-10-CM | POA: Diagnosis not present

## 2021-08-29 DIAGNOSIS — R0902 Hypoxemia: Secondary | ICD-10-CM | POA: Diagnosis present

## 2021-08-29 DIAGNOSIS — Z7401 Bed confinement status: Secondary | ICD-10-CM | POA: Diagnosis not present

## 2021-08-29 DIAGNOSIS — I5022 Chronic systolic (congestive) heart failure: Secondary | ICD-10-CM | POA: Diagnosis not present

## 2021-08-29 DIAGNOSIS — Z743 Need for continuous supervision: Secondary | ICD-10-CM | POA: Diagnosis not present

## 2021-08-29 LAB — BASIC METABOLIC PANEL
Anion gap: 12 (ref 5–15)
BUN: 14 mg/dL (ref 8–23)
CO2: 20 mmol/L — ABNORMAL LOW (ref 22–32)
Calcium: 8.7 mg/dL — ABNORMAL LOW (ref 8.9–10.3)
Chloride: 103 mmol/L (ref 98–111)
Creatinine, Ser: 1.13 mg/dL (ref 0.61–1.24)
GFR, Estimated: >60 mL/min (ref >60)
Glucose, Bld: 106 mg/dL — ABNORMAL HIGH (ref 70–99)
Potassium: 3.3 mmol/L — ABNORMAL LOW (ref 3.5–5.1)
Sodium: 135 mmol/L (ref 135–145)

## 2021-08-29 LAB — RESP PANEL BY RT-PCR (FLU A&B, COVID) ARPGX2
Influenza A by PCR: NEGATIVE
Influenza B by PCR: NEGATIVE
SARS Coronavirus 2 by RT PCR: POSITIVE — AB

## 2021-08-29 LAB — CBC
HCT: 37.9 % — ABNORMAL LOW (ref 39.0–52.0)
Hemoglobin: 13.1 g/dL (ref 13.0–17.0)
MCH: 32.9 pg (ref 26.0–34.0)
MCHC: 34.6 g/dL (ref 30.0–36.0)
MCV: 95.2 fL (ref 80.0–100.0)
Platelets: 268 10*3/uL (ref 150–400)
RBC: 3.98 MIL/uL — ABNORMAL LOW (ref 4.22–5.81)
RDW: 14.6 % (ref 11.5–15.5)
WBC: 7.6 10*3/uL (ref 4.0–10.5)
nRBC: 0 % (ref 0.0–0.2)

## 2021-08-29 LAB — GLUCOSE, CAPILLARY
Glucose-Capillary: 109 mg/dL — ABNORMAL HIGH (ref 70–99)
Glucose-Capillary: 120 mg/dL — ABNORMAL HIGH (ref 70–99)
Glucose-Capillary: 130 mg/dL — ABNORMAL HIGH (ref 70–99)
Glucose-Capillary: 97 mg/dL (ref 70–99)

## 2021-08-29 MED ORDER — SODIUM CHLORIDE 0.9 % IV SOLN
1.0000 g | INTRAVENOUS | Status: DC
  Administered 2021-08-29: 1 g via INTRAVENOUS
  Filled 2021-08-29: qty 10

## 2021-08-29 MED ORDER — CEFDINIR 300 MG PO CAPS: 300.0000 mg | ORAL_CAPSULE | Freq: Two times a day (BID) | ORAL | 0 refills | Status: DC

## 2021-08-29 MED ORDER — CARVEDILOL 6.25 MG PO TABS: 6.2500 mg | ORAL_TABLET | Freq: Two times a day (BID) | ORAL | Status: DC

## 2021-08-29 MED ORDER — SPIRONOLACTONE 25 MG PO TABS: 25.0000 mg | ORAL_TABLET | Freq: Every day | ORAL | 6 refills | Status: DC

## 2021-08-29 MED ORDER — CARVEDILOL 6.25 MG PO TABS: 6.2500 mg | ORAL_TABLET | Freq: Two times a day (BID) | ORAL | 0 refills | Status: DC

## 2021-08-29 MED ORDER — MOLNUPIRAVIR EUA 200MG CAPSULE
4.0000 | ORAL_CAPSULE | Freq: Two times a day (BID) | ORAL | Status: DC
  Administered 2021-08-29 – 2021-08-30 (×2): 800 mg via ORAL
  Filled 2021-08-29: qty 4

## 2021-08-29 NOTE — ED Triage Notes (Addendum)
Pt here via [facility] from [facility] for testing positive for COVID. Pt was d/c earlier today, when pt got back to facility covid test came back positive and facility said they "have the right to send him back" because they were not informed of positive result. Pt denies pain, shob. AOx4

## 2021-08-29 NOTE — TOC Transition Note (Addendum)
Transition of Care [facility]) - CM/SW Discharge Note   Patient Details  Name: Shawn [name] MRN: [id number] Date of Birth: August 13, 1951  Transition of Care [facility]) CM/SW Contact:  [place], [place] Phone Number: 08/29/2021, 10:53 AM   Clinical Narrative:    Patient will DC to: SNF, [facility] Anticipated DC date: 08/29/2021 Family notified: Yes, dtr [place] Transport by: Corey Harold   Per MD patient ready for DC to SNF, [facility]. RN to call report prior to discharge [phone] ask for the 200 hall nurse room# [id number]). RN, patient, patient's family, and facility notified of DC. Discharge Summary and FL2 sent to facility. DC packet on chart. Attending MD to sign needed prescriptions for Mr. Regen. Ambulance transport requested for patient.   CSW will sign off for now as social work intervention is no longer needed. Please consult [place] again if new needs arise.     Final next level of care: Skilled Nursing Facility Barriers to Discharge: No Barriers Identified   Patient Goals and CMS Choice Patient states their goals for this hospitalization and ongoing recovery are:: patient does not respond verbally to question's unable to assess CMS Medicare.gov Compare Post Acute Care list provided to:: Patient Choice offered to / list presented to : Patient  Discharge Placement PASRR number recieved: 08/29/21            Patient chooses bed at: [facility] Patient to be transferred to facility by: [place] Name of family member notified: [facility] Patient and family notified of of transfer: 08/29/21  Discharge Plan and Services     Post Acute Care Choice: NA                               Social Determinants of Health (SDOH) Interventions     Readmission Risk Interventions No flowsheet data found.   Taleigha Pinson, MSW, [place] [facility] Failure Social Worker

## 2021-08-29 NOTE — ED Provider Notes (Signed)
[place] EMERGENCY DEPARTMENT Provider Note   CSN: [id number] Arrival date & time: 08/29/21  1656     History Chief Complaint  Patient presents with   covid +    (D/c earlier today and facility refused to accept pt due to covid status)    Shawn [name] is a 70 y.o. male.  HPI Patient is here because of a positive COVID test.  He was discharged to the hospital today.  He had a COVID test done prior to discharge that returned positive, after he left.  He arrived to his nursing home and they sent him back here because they do not have a COVID unit.  The patient cannot tell me what happened today, and states he was not in the hospital today.  Level 5 caveat-altered mental status    Past Medical History:  Diagnosis Date   Diabetes mellitus without complication (HCC)    Elevated cholesterol     Patient Active Problem List   Diagnosis Date Noted   Cerebral embolism with cerebral infarction 08/23/2021   Right sided weakness 08/22/2021   Essential hypertension 08/22/2021   PAD (peripheral artery disease) ([place]) 01/08/2019   Knee injury, right, initial encounter 01/07/2019   History of amputation of right lower extremity through tibia and fibula ([place]) 08/18/2018   Type 2 diabetes mellitus with circulatory disorder, without long-term current use of insulin ([place]) 07/23/2018   Osteomyelitis of hand, left, acute ([place]) 02/18/2018    Past Surgical History:  Procedure Laterality Date   AMPUTATION Left 01/07/2019   Procedure: AMPUTATION ABOVE KNEE;  Surgeon: Waynetta Sandy, MD;  Location: [place];  Service: Vascular;  Laterality: Left;   CHOLECYSTECTOMY     EYE SURGERY     INCISION AND DRAINAGE ABSCESS Left 02/03/2018   Procedure: INCISION AND DRAINAGE LEFT THUMB INFECTION;  Surgeon: Charlotte Crumb, MD;  Location: [place];  Service: Orthopedics;  Laterality: Left;   TEE WITHOUT CARDIOVERSION N/A 08/26/2021   Procedure: TRANSESOPHAGEAL  ECHOCARDIOGRAM (TEE);  Surgeon: Larey Dresser, MD;  Location: [facility] ENDOSCOPY;  Service: Cardiovascular;  Laterality: N/A;   TONSILLECTOMY         History reviewed. No pertinent family history.  Social History   Tobacco Use   Smoking status: Never   Smokeless tobacco: Never  Vaping Use   Vaping Use: Never used  Substance Use Topics   Alcohol use: Yes    Alcohol/week: 42.0 standard drinks    Types: 42 Cans of beer per week    Comment: "~ 6 [employer] a day"   Drug use: No    Home Medications Prior to Admission medications   Medication Sig Start Date End Date Taking? Authorizing Provider  apixaban (ELIQUIS) 5 MG TABS tablet Take 1 tablet (5 mg total) by mouth 2 (two) times daily. 08/27/21   Regalado, Jerald Kief A, MD  aspirin EC 81 MG tablet Take 81 mg by mouth daily.    [provider]  atorvastatin (LIPITOR) 80 MG tablet Take 1 tablet (80 mg total) by mouth daily. 08/28/21   Regalado, Belkys A, MD  carvedilol (COREG) 3.125 MG tablet Take 1 tablet (3.125 mg total) by mouth 2 (two) times daily with a meal. 08/27/21   Regalado, Belkys A, MD  carvedilol (COREG) 6.25 MG tablet Take 1 tablet (6.25 mg total) by mouth 2 (two) times daily with a meal. 08/29/21   Domenic Polite, MD  cefdinir (OMNICEF) 300 MG capsule Take 1 capsule (300 mg total)  by mouth 2 (two) times daily for 3 days. 08/29/21 09/01/21  Domenic Polite, MD  dapagliflozin propanediol (FARXIGA) 10 MG TABS tablet Take 1 tablet (10 mg total) by mouth daily. 08/27/21   Regalado, Belkys A, MD  insulin glargine-yfgn (SEMGLEE) 100 UNIT/ML injection Inject 0.1 mLs (10 Units total) into the skin daily. 08/28/21   Regalado, Belkys A, MD  sacubitril-valsartan (ENTRESTO) 24-26 MG Take 1 tablet by mouth 2 (two) times daily. 08/27/21   Regalado, Belkys A, MD  spironolactone (ALDACTONE) 25 MG tablet Take 1 tablet (25 mg total) by mouth daily. 08/29/21   Domenic Polite, MD  vitamin B-12 (CYANOCOBALAMIN) 1000 MCG tablet Take 1 tablet  (1,000 mcg total) by mouth daily. 08/27/21 08/27/22  Regalado, Cassie Freer, MD    Allergies    Patient has no known allergies.  Review of Systems   Review of Systems  Unable to perform ROS: Mental status change   Physical Exam Updated Vital Signs BP 108/75 (BP Location: Left Arm)   Pulse (!) 45   Temp 99.1 F (37.3 C) (Oral)   Resp 16   SpO2 100%   Physical Exam Vitals and nursing note reviewed.  Constitutional:      Appearance: He is well-developed. He is not ill-appearing.  HENT:     Head: Normocephalic and atraumatic.     Right Ear: External ear normal.     Left Ear: External ear normal.  Eyes:     Conjunctiva/sclera: Conjunctivae normal.     Pupils: Pupils are equal, round, and reactive to light.  Neck:     Trachea: Phonation normal.  Cardiovascular:     Rate and Rhythm: Normal rate.  Pulmonary:     Effort: Pulmonary effort is normal.  Abdominal:     General: There is no distension.     Palpations: Abdomen is soft.  Musculoskeletal:        General: Normal range of motion.     Cervical back: Normal range of motion and neck supple.  Skin:    General: Skin is warm and dry.  Neurological:     Mental Status: He is alert.     Cranial Nerves: No cranial nerve deficit.     Sensory: No sensory deficit.     Motor: No abnormal muscle tone.     Coordination: Coordination normal.  Psychiatric:        Mood and Affect: Mood normal.        Behavior: Behavior normal.    ED Results / Procedures / Treatments   Labs (all labs ordered are listed, but only abnormal results are displayed) Labs Reviewed  BASIC METABOLIC PANEL  CBC WITH DIFFERENTIAL/PLATELET    EKG EKG Interpretation  Date/Time:  Thursday August 29 2021 20:54:56 EDT Ventricular Rate:  90 PR Interval:  201 QRS Duration: 79 QT Interval:  357 QTC Calculation: 437 R Axis:   40 Text Interpretation: Sinus rhythm Anteroseptal infarct, old Nonspecific T abnormalities, lateral leads Since last tracing rate  slower Otherwise no significant change Confirmed by Daleen Bo [phone]) on 08/29/2021 10:45:34 PM Also confirmed by Daleen Bo [phone]), editor Lamar Benes, Ashwini [phone])  on 08/30/2021 7:48:19 AM  Radiology DG CHEST PORT 1 VIEW  Result Date: 08/28/2021 CLINICAL DATA:  Fever. EXAM: PORTABLE CHEST 1 VIEW COMPARISON:  Chest x-ray dated August 22, 2021. FINDINGS: The heart size and mediastinal contours are within normal limits. Normal pulmonary vascularity. No focal consolidation, pleural effusion, or pneumothorax. No acute osseous abnormality. Similar 6 mm radiopaque density projecting  over the AP window. [place] again noted over left upper quadrant. IMPRESSION: 1. No active disease. Electronically Signed   By: Titus Dubin M.D.   On: 08/28/2021 13:50    Procedures Procedures   Medications Ordered in ED Medications  molnupiravir EUA (LAGEVRIO) capsule 800 mg (has no administration in time range)    ED Course  I have reviewed the triage vital signs and the nursing notes.  Pertinent labs & imaging results that were available during my care of the patient were reviewed by me and considered in my medical decision making (see chart for details).    MDM Rules/Calculators/A&P                            Patient Vitals for the past 24 hrs:  BP Temp Temp src Pulse Resp SpO2  08/29/21 2055 108/75 99.1 F (37.3 C) Oral (!) 45 16 100 %  08/29/21 1703 112/80 98.4 F (36.9 C) Oral 91 15 99 %  08/29/21 1658 -- -- -- -- -- (!) 88 %    11:21 PM Reevaluation with update and discussion. After initial assessment and treatment, an updated evaluation reveals he remained stable with normal oxygenation.  Patient's daughter in the room and I have confirmed the findings and plan with her.  She is agreeable. Daleen Bo   Medical Decision Making:  This patient is presenting for evaluation of COVID infection, which does require a range of treatment options, and is a complaint that  involves a moderate risk of morbidity and mortality. The differential diagnoses include COVID infection, metabolic disorder, complications from recent stroke. I decided to review old records, and in summary elderly male, discharged from hospital after being admitted and treated for stroke.  He apparently had a predischarge COVID test on the return later and was positive..  I obtained additional historical information from daughter at bedside.  She states that he has mildly more confused than usual.  Clinical Laboratory Tests Ordered, included CBC and Metabolic panel. Review indicates normal except white count high, CO2 low, glucose high, creatinine high, anion gap high, MCV high. Radiologic Tests Ordered, included chest x-ray.  I independently Visualized: Radiographic images, which show no acute abnormalities  Cardiac Monitor Tracing which shows sinus rhythm     Critical Interventions-clinical evaluation, laboratory testing, radiography, oxygen saturation, observation and reassessment.  Medications started to treat acute COVID infection with high risk comorbidities.  He is not a candidate for Paxlovid due to anticoagulation.  Arrangements for hospitalization  After These Interventions, the Patient was reevaluated and was found with transient hypoxia and positive COVID status.  CRITICAL CARE-no Performed by: Daleen Bo  Nursing Notes Reviewed/ Care Coordinated Applicable Imaging Reviewed Interpretation of Laboratory Data incorporated into ED treatment   11:05 PM-Case discussed with hospitalist who will admit the patient.  Final Clinical Impression(s) / ED Diagnoses Final diagnoses:  [id number] virus infection  Hypoxia    Rx / DC Orders ED Discharge Orders     None        Daleen Bo, MD 08/31/21 1246

## 2021-08-29 NOTE — Discharge Summary (Addendum)
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 wall motion abnormalities. The left ventricular internal cavity size was normal in size. Right Ventricle: The right ventricular size is normal. No increase in right ventricular wall thickness. Right ventricular systolic function is normal. Left Atrium: Left atrial size was mildly dilated. A left atrial/left atrial appendage thrombus was detected. Right Atrium: Right atrial size was normal in size. Pericardium: There is no evidence of pericardial effusion. Mitral Valve: The mitral valve is normal in structure. Mild mitral annular calcification. No evidence of mitral valve regurgitation. No evidence of mitral valve stenosis. Tricuspid Valve: The tricuspid valve is normal in structure. Tricuspid valve regurgitation is not demonstrated. Aortic Valve: The aortic valve is tricuspid. Aortic valve regurgitation is not visualized. Mild aortic valve sclerosis is present, with no evidence of aortic valve stenosis.  Pulmonic Valve: The pulmonic valve was normal in structure. Pulmonic valve regurgitation is not visualized. Aorta: The aortic root is normal in size and structure. IAS/Shunts: No PFO or ASD by color doppler. [employer] Electronically signed by Franki Monte Signature Date/Time: 08/26/2021/5:53:00 PM    Final     Subjective: Feels ok overall, no complaints today, no further fevers  Discharge Exam: Vitals:   08/28/21 2338 08/29/21 0355  BP: 113/65 115/79  Pulse: 83 85  Resp: 19 18  Temp: 97.9 F (36.6 C) 98.4 F (36.9 C)  SpO2: 95% 100%     General: Pt is alert, awake, not in acute distress Cardiovascular: RRR, S1/S2 +, no rubs, no gallops Respiratory: CTA bilaterally, no wheezing, no rhonchi Abdominal: Soft, NT, ND, bowel sounds + Extremities: right side weakness, Right BKA, left AKA    The results of significant diagnostics from this hospitalization (including imaging, microbiology, ancillary and laboratory) are listed below for reference.     Microbiology: Recent Results (from the past 240 hour(s))  Resp Panel by RT-PCR (Flu A&B, Covid) Nasopharyngeal Swab     Status: None   Collection Time: 08/22/21  1:13 AM   Specimen: Nasopharyngeal Swab; Nasopharyngeal(NP) swabs in vial transport medium  Result Value Ref Range Status   SARS Coronavirus 2 by RT PCR NEGATIVE NEGATIVE Final    Comment: (NOTE) SARS-CoV-2 target nucleic acids are NOT DETECTED.  The SARS-CoV-2 RNA is generally detectable in upper respiratory specimens during the acute phase of infection. The lowest concentration of SARS-CoV-2 viral copies this assay can detect is 138 copies/mL. A negative result does not preclude SARS-Cov-2 infection and should not be used as the sole basis for treatment or other patient management decisions. A negative result may occur with  improper specimen collection/handling, submission of specimen other than nasopharyngeal swab, presence of viral mutation(s) within the areas  targeted by this assay, and inadequate number of viral copies(<138 copies/mL). A negative result must be combined with clinical observations, patient history, and epidemiological information. The expected result is Negative.  Fact Sheet for Patients:  EntrepreneurPulse.com.au  Fact Sheet for Healthcare Providers:  IncredibleEmployment.be  This test is no t yet approved or cleared by the [place] FDA and  has been authorized for detection and/or diagnosis of  SARS-CoV-2 by FDA under an Emergency Use Authorization (EUA). This EUA will remain  in effect (meaning this test can be used) for the duration of the COVID-19 declaration under Section 564(b)(1) of the Act, 21 U.S.C.section 360bbb-3(b)(1), unless the authorization is terminated  or revoked sooner.       Influenza A by PCR NEGATIVE NEGATIVE Final   Influenza B by PCR NEGATIVE NEGATIVE Final    Comment: (NOTE) The Xpert Xpress SARS-CoV-2/FLU/RSV plus assay is intended as an aid in the diagnosis of influenza from Nasopharyngeal swab specimens and should not be used as a sole basis for treatment. Nasal washings and aspirates are unacceptable for Xpert Xpress SARS-CoV-2/FLU/RSV testing.  Fact Sheet for Patients: EntrepreneurPulse.com.au  Fact Sheet for Healthcare Providers: IncredibleEmployment.be  This test is not yet approved or cleared by the [place] FDA and has been authorized for detection and/or diagnosis of SARS-CoV-2 by FDA under an Emergency Use Authorization (EUA). This EUA will remain in effect (meaning this test can be used) for the duration of the COVID-19 declaration under Section 564(b)(1) of the Act, 21 U.S.C. section 360bbb-3(b)(1), unless the authorization is terminated or revoked.  Performed at [facility], [place] [address] [place], [place] [address]      Labs: BNP (last 3 results) No results for input(s): BNP in  the last 8760 hours. Basic Metabolic Panel: Recent Labs  Lab 08/25/21 0245 08/26/21 0332 08/27/21 0354 08/28/21 0352 08/29/21 0331  NA 136 134* 131* 134* 135  K 3.1* 3.5 3.5 3.6 3.3*  CL 105 102 99 97* 103  CO2 23 21* 20* 22 20*  GLUCOSE 138* 149* 112* 133* 106*  BUN '9 11 10 15 14  '$ CREATININE 0.95 1.03 1.03 1.18 1.13  CALCIUM 9.1 8.7* 8.5* 9.0 8.7*   Liver Function Tests: No results for input(s): AST, ALT, ALKPHOS, BILITOT, PROT, ALBUMIN in the last 168 hours.  No results for input(s): LIPASE, AMYLASE in the last 168 hours. Recent Labs  Lab 08/23/21 0104  AMMONIA 19   CBC: Recent Labs  Lab 08/23/21 0745 08/24/21 0840 08/27/21 0354 08/28/21 1055 08/29/21 0331  WBC 7.6 8.3 6.6 8.7 7.6  HGB 13.0 13.3 13.1 13.4 13.1  HCT 39.8 40.6 38.9* 38.5* 37.9*  MCV 97.5 98.1 96.5 95.5 95.2  PLT 318 312 258 262 268   Cardiac Enzymes: No results for input(s): CKTOTAL, CKMB, CKMBINDEX, TROPONINI in the last 168 hours. BNP: Invalid input(s): POCBNP CBG: Recent Labs  Lab 08/28/21 1547 08/28/21 2015 08/29/21 0020 08/29/21 0355 08/29/21 0800  GLUCAP 105* 166* 109* 120* 130*   D-Dimer No results for input(s): DDIMER in the last 72 hours. Hgb A1c No results for input(s): HGBA1C in the last 72 hours. Lipid Profile No results for input(s): CHOL, HDL, LDLCALC, TRIG, CHOLHDL, LDLDIRECT in the last 72 hours. Thyroid function studies No results for input(s): TSH, T4TOTAL, T3FREE, THYROIDAB in the last 72 hours.  Invalid input(s): FREET3 Anemia work up No results for input(s): VITAMINB12, FOLATE, FERRITIN, TIBC, IRON, RETICCTPCT in the last 72 hours. Urinalysis    Component Value Date/Time   COLORURINE YELLOW 08/28/2021 1027   APPEARANCEUR CLEAR 08/28/2021 1027   LABSPEC 1.015 08/28/2021 1027   PHURINE 5.5 08/28/2021 1027   GLUCOSEU >=500 (A) 08/28/2021 1027   HGBUR TRACE (A) 08/28/2021 1027   BILIRUBINUR NEGATIVE 08/28/2021 1027   KETONESUR 40 (A) 08/28/2021 1027    PROTEINUR NEGATIVE 08/28/2021 1027   NITRITE POSITIVE (A) 08/28/2021 1027   LEUKOCYTESUR SMALL (A) 08/28/2021 1027  Sepsis Labs Invalid input(s): PROCALCITONIN,  WBC,  LACTICIDVEN Microbiology Recent Results (from the past 240 hour(s))  Resp Panel by RT-PCR (Flu A&B, Covid) Nasopharyngeal Swab     Status: None   Collection Time: 08/22/21  1:13 AM   Specimen: Nasopharyngeal Swab; Nasopharyngeal(NP) swabs in vial transport medium  Result Value Ref Range Status   SARS Coronavirus 2 by RT PCR NEGATIVE NEGATIVE Final    Comment: (NOTE) SARS-CoV-2 target nucleic acids are NOT DETECTED.  The SARS-CoV-2 RNA is generally detectable in upper respiratory specimens during the acute phase of infection. The lowest concentration of SARS-CoV-2 viral copies this assay can detect is 138 copies/mL. A negative result does not preclude SARS-Cov-2 infection and should not be used as the sole basis for treatment or other patient management decisions. A negative result may occur with  improper specimen collection/handling, submission of specimen other than nasopharyngeal swab, presence of viral mutation(s) within the areas targeted by this assay, and inadequate number of viral copies(<138 copies/mL). A negative result must be combined with clinical observations, patient history, and epidemiological information. The expected result is Negative.  Fact Sheet for Patients:  EntrepreneurPulse.com.au  Fact Sheet for Healthcare Providers:  IncredibleEmployment.be  This test is no t yet approved or cleared by the [place] FDA and  has been authorized for detection and/or diagnosis of SARS-CoV-2 by FDA under an Emergency Use Authorization (EUA). This EUA will remain  in effect (meaning this test can be used) for the duration of the COVID-19 declaration under Section 564(b)(1) of the Act, 21 U.S.C.section 360bbb-3(b)(1), unless the authorization is terminated  or  revoked sooner.       Influenza A by PCR NEGATIVE NEGATIVE Final   Influenza B by PCR NEGATIVE NEGATIVE Final    Comment: (NOTE) The Xpert Xpress SARS-CoV-2/FLU/RSV plus assay is intended as an aid in the diagnosis of influenza from Nasopharyngeal swab specimens and should not be used as a sole basis for treatment. Nasal washings and aspirates are unacceptable for Xpert Xpress SARS-CoV-2/FLU/RSV testing.  Fact Sheet for Patients: EntrepreneurPulse.com.au  Fact Sheet for Healthcare Providers: IncredibleEmployment.be  This test is not yet approved or cleared by the [place] FDA and has been authorized for detection and/or diagnosis of SARS-CoV-2 by FDA under an Emergency Use Authorization (EUA). This EUA will remain in effect (meaning this test can be used) for the duration of the COVID-19 declaration under Section 564(b)(1) of the Act, 21 U.S.C. section 360bbb-3(b)(1), unless the authorization is terminated or revoked.  Performed at [facility], [place] [address] [place], [place] [address]      Time coordinating discharge: 52mn  SIGNED:   PDomenic Polite MD  [facility]

## 2021-08-29 NOTE — Progress Notes (Signed)
  Speech Language Pathology Treatment: Cognitive-Linquistic  Patient Details Name: Shawn [name] MRN: [id number] DOB: September 05, 1951 Today's Date: 08/29/2021 Time: [id number] SLP Time Calculation (min) (ACUTE ONLY): 16 min  Assessment / Plan / Recommendation Clinical Impression  Pt seen this am for skilled speech, cognitive and language diagnostic treatment. Pt alert and reclined in bed, greeted SLP upon arrival. Speech intelligibility has improved, however pt required consistent verbal cues for increase of vocal intensity at the sentence level during structured and unstructured tasks. Portions of [employer] Mental Status Examination (SLUMS) administered with pt demonstrating reduced orientation to time/place (2/3), immediate word list recall (4/5), delayed recall (0/5, 5/5 with semantic and choice cues) and executive functions/calculations (0/3). Complex 2-3 step commands and complex y/n questions continue to pose difficulty for pt, however suspect more cognitive component than language. Attention and short term recall appear to impact performance on these language tasks. SLP to continue f/u for speech and cognition during acute stay.   HPI HPI: Pt is 70 yo male who presented with confusion, R sided weakness and facial droop.  He has been admitted for CVA workup on 08/22/21.  CT head (08/22/21) negative for acute findings. Pt is not felt to be a candidate for MRI due to safety. CTA head/neck (08/24/21): acute left ACA infarct, severe stenosis supraclinoid RICA. At baseline, pt is wheelchair-bound but is able to easily transfer from wheelchair to bed and is usually alert, awake and oriented. PMH: DM, HTN, pVD a status post right BKA and left AKA, hyperlipidemia      SLP Plan  Continue with current plan of care       Recommendations                   Follow up Recommendations: Skilled Nursing facility;24 hour supervision/assistance SLP Visit Diagnosis: Dysarthria and anarthria  (R47.1);Cognitive communication deficit (R41.841) Plan: Continue with current plan of care       [place], [place], [place] Office Number: [phone]  Acie Fredrickson 08/29/2021, 9:01 AM
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
## 2021-08-29 NOTE — Care Management (Signed)
[facility] RN Care Manager received call from ED Triage EDP concerning patient who discharged to [place]  today 9/14 was transferred back to [facility] ED due to a positive Covid result today.   As per EDP patient does not meet a criteria for admission at this time.Marland Kitchen  RNCM  updated TOC Supervisor  on call,. TOC will evaluate int the am to seek another placement facility for Covid positive patients.  Handoff will be left  for [facility] team to follow up in the am. Updated ED Charge.

## 2021-08-29 NOTE — Progress Notes (Signed)
EMS arrived to the patients' room.  RN not aware patient approved to SNF, that transport had been arranged, or that EMS had arrived.  Assisted EMS with removing PIVs and helping patient on to the EMS stretcher.  Patient belongings collected and given to EMS.   Notified EMS techs that discharge paperwork by RN had not been done prior to their arrival.  Per EMS, "we have all his paper already.  We're okay to go."  Patient alert and oriented x 4 on room air.  Received notification afterwards from social worker to contact [employer] with patient report.  Report called to admitting RN, who had no further questions/ concerns.  Admitting RN given my phone number to call back with any further questions.
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
## 2021-08-29 NOTE — Progress Notes (Addendum)
Mr. Wenzinger COVID-19 test came back positive. 3:39pm - CSW notified the SNF, [facility] regarding the positive COVID test and they reported they will be sending Mr. Passon back to the hospital. 3:55pm - CSW called [facility] and made them aware of the positive COVID test as well.  4:12pm - CSW called the patient's daughter, Overton Mam [phone] and notified her about the positive COVID test for Mr. Sinner. Overton Mam reported she would like to know when her father gets back to the hospital and if one of the nurses or the CSW can please let her know when he arrives. CSW sent a secure chat to the previous attending MD regarding the positive test results as well. 5:57pm - CSW notified the patient's daughter, Overton Mam that her dad is back at the hospital in the ED. Overton Mam may bring her dad a cell phone in case he doesn't have access to a phone in the ED.   Jcion Buddenhagen, MSW, [place] Heart Failure Social Worker

## 2021-08-29 NOTE — TOC Progression Note (Signed)
Transition of Care [facility]) - Progression Note    Patient Details  Name: Shawn [name] MRN: [id number] Date of Birth: 11-11-1951  Transition of Care [facility]) CM/SW [place], [place] Phone Number: 08/29/2021, 9:22 AM  Clinical Narrative:    CSW reached out to the attending MD to see about patient discharging today and needing a COVID test.  9:20am - CSW received a call from the patient's daughter Overton Mam, [phone] asking about her dad discharging today as she and her brother are on their way over to [facility], SNF to sign paperwork. CSW informed Overton Mam that as soon as the CSW finds out about discharge she will call and let her know.  CSW will continue to follow throughout discharge.   Expected Discharge Plan: [place] Barriers to Discharge: Continued Medical Work up, Ship broker  Expected Discharge Plan and Services Expected Discharge Plan: [place] Acute Care Choice: NA Living arrangements for the past 2 months: Apartment Expected Discharge Date: 08/27/21                                     Social Determinants of Health (SDOH) Interventions    Readmission Risk Interventions No flowsheet data found.  Rhonda Linan, MSW, [place] Heart Failure Social Worker

## 2021-08-29 NOTE — ED Provider Notes (Addendum)
Emergency Medicine Provider Triage Evaluation Note  Shawn [name] , a 70 y.o. male  was evaluated in triage.  Pt seen earlier today in the ED and covid test was completed. This resulted while he was at the facility and he was sent back here. Pt states he is somewhat sob  Review of Systems  Positive: sob Negative: Chest pain  Physical Exam  There were no vitals taken for this visit. Gen:   Awake, no distress   Resp:  Normal effort  MSK:   Moves extremities without difficulty  Medical Decision Making  Medically screening exam initiated at 4:58 PM.  Appropriate orders placed.  Shawn [name] was informed that the remainder of the evaluation will be completed by another provider, this initial triage assessment does not replace that evaluation, and the importance of remaining in the ED until their evaluation is complete.  5:08 PM Discussed case with Kennon Holter, RN with social work. She will look into the issue and f/u on plan for patient.    Rodney Booze, PA-C 08/29/21 1703    Rodney Booze, PA-C 08/29/21 1709    Daleen Bo, MD 08/31/21 1247

## 2021-08-30 ENCOUNTER — Observation Stay (HOSPITAL_COMMUNITY): Payer: Medicare HMO

## 2021-08-30 DIAGNOSIS — E871 Hypo-osmolality and hyponatremia: Secondary | ICD-10-CM | POA: Diagnosis present

## 2021-08-30 DIAGNOSIS — Z89612 Acquired absence of left leg above knee: Secondary | ICD-10-CM | POA: Diagnosis not present

## 2021-08-30 DIAGNOSIS — E785 Hyperlipidemia, unspecified: Secondary | ICD-10-CM | POA: Diagnosis not present

## 2021-08-30 DIAGNOSIS — R0902 Hypoxemia: Secondary | ICD-10-CM | POA: Diagnosis present

## 2021-08-30 DIAGNOSIS — N179 Acute kidney failure, unspecified: Secondary | ICD-10-CM | POA: Diagnosis present

## 2021-08-30 DIAGNOSIS — I10 Essential (primary) hypertension: Secondary | ICD-10-CM | POA: Diagnosis not present

## 2021-08-30 DIAGNOSIS — K5900 Constipation, unspecified: Secondary | ICD-10-CM | POA: Diagnosis present

## 2021-08-30 DIAGNOSIS — Z7982 Long term (current) use of aspirin: Secondary | ICD-10-CM | POA: Diagnosis not present

## 2021-08-30 DIAGNOSIS — I959 Hypotension, unspecified: Secondary | ICD-10-CM | POA: Diagnosis present

## 2021-08-30 DIAGNOSIS — N390 Urinary tract infection, site not specified: Secondary | ICD-10-CM | POA: Diagnosis present

## 2021-08-30 DIAGNOSIS — I639 Cerebral infarction, unspecified: Secondary | ICD-10-CM

## 2021-08-30 DIAGNOSIS — I63442 Cerebral infarction due to embolism of left cerebellar artery: Secondary | ICD-10-CM | POA: Diagnosis not present

## 2021-08-30 DIAGNOSIS — I739 Peripheral vascular disease, unspecified: Secondary | ICD-10-CM | POA: Diagnosis not present

## 2021-08-30 DIAGNOSIS — I255 Ischemic cardiomyopathy: Secondary | ICD-10-CM | POA: Diagnosis present

## 2021-08-30 DIAGNOSIS — I513 Intracardiac thrombosis, not elsewhere classified: Secondary | ICD-10-CM | POA: Diagnosis present

## 2021-08-30 DIAGNOSIS — D6489 Other specified anemias: Secondary | ICD-10-CM | POA: Diagnosis present

## 2021-08-30 DIAGNOSIS — E7800 Pure hypercholesterolemia, unspecified: Secondary | ICD-10-CM | POA: Diagnosis present

## 2021-08-30 DIAGNOSIS — E872 Acidosis: Secondary | ICD-10-CM | POA: Diagnosis present

## 2021-08-30 DIAGNOSIS — Z79899 Other long term (current) drug therapy: Secondary | ICD-10-CM | POA: Diagnosis not present

## 2021-08-30 DIAGNOSIS — E1151 Type 2 diabetes mellitus with diabetic peripheral angiopathy without gangrene: Secondary | ICD-10-CM | POA: Diagnosis present

## 2021-08-30 DIAGNOSIS — I5042 Chronic combined systolic (congestive) and diastolic (congestive) heart failure: Secondary | ICD-10-CM | POA: Diagnosis present

## 2021-08-30 DIAGNOSIS — Z89511 Acquired absence of right leg below knee: Secondary | ICD-10-CM | POA: Diagnosis not present

## 2021-08-30 DIAGNOSIS — Z794 Long term (current) use of insulin: Secondary | ICD-10-CM | POA: Diagnosis not present

## 2021-08-30 DIAGNOSIS — Z7901 Long term (current) use of anticoagulants: Secondary | ICD-10-CM | POA: Diagnosis not present

## 2021-08-30 DIAGNOSIS — E876 Hypokalemia: Secondary | ICD-10-CM | POA: Diagnosis present

## 2021-08-30 DIAGNOSIS — U071 COVID-19: Secondary | ICD-10-CM | POA: Diagnosis present

## 2021-08-30 DIAGNOSIS — D72828 Other elevated white blood cell count: Secondary | ICD-10-CM | POA: Diagnosis present

## 2021-08-30 DIAGNOSIS — Z66 Do not resuscitate: Secondary | ICD-10-CM | POA: Diagnosis present

## 2021-08-30 DIAGNOSIS — I110 Hypertensive heart disease with heart failure: Secondary | ICD-10-CM | POA: Diagnosis present

## 2021-08-30 DIAGNOSIS — E875 Hyperkalemia: Secondary | ICD-10-CM | POA: Diagnosis present

## 2021-08-30 DIAGNOSIS — R509 Fever, unspecified: Secondary | ICD-10-CM | POA: Diagnosis not present

## 2021-08-30 LAB — CBC WITH DIFFERENTIAL/PLATELET
Abs Immature Granulocytes: 0.09 10*3/uL — ABNORMAL HIGH (ref 0.00–0.07)
Basophils Absolute: 0 10*3/uL (ref 0.0–0.1)
Basophils Relative: 0 %
Eosinophils Absolute: 0.1 10*3/uL (ref 0.0–0.5)
Eosinophils Relative: 0 %
HCT: 45 % (ref 39.0–52.0)
Hemoglobin: 14.7 g/dL (ref 13.0–17.0)
Immature Granulocytes: 1 %
Lymphocytes Relative: 7 %
Lymphs Abs: 1.2 10*3/uL (ref 0.7–4.0)
MCH: 32.9 pg (ref 26.0–34.0)
MCHC: 32.7 g/dL (ref 30.0–36.0)
MCV: 100.7 fL — ABNORMAL HIGH (ref 80.0–100.0)
Monocytes Absolute: 1.3 10*3/uL — ABNORMAL HIGH (ref 0.1–1.0)
Monocytes Relative: 7 %
Neutro Abs: 15.1 10*3/uL — ABNORMAL HIGH (ref 1.7–7.7)
Neutrophils Relative %: 85 %
Platelets: 302 10*3/uL (ref 150–400)
RBC: 4.47 MIL/uL (ref 4.22–5.81)
RDW: 14.7 % (ref 11.5–15.5)
WBC: 17.8 10*3/uL — ABNORMAL HIGH (ref 4.0–10.5)
nRBC: 0 % (ref 0.0–0.2)

## 2021-08-30 LAB — BASIC METABOLIC PANEL
Anion gap: 20 — ABNORMAL HIGH (ref 5–15)
BUN: 21 mg/dL (ref 8–23)
CO2: 13 mmol/L — ABNORMAL LOW (ref 22–32)
Calcium: 9.3 mg/dL (ref 8.9–10.3)
Chloride: 104 mmol/L (ref 98–111)
Creatinine, Ser: 1.28 mg/dL — ABNORMAL HIGH (ref 0.61–1.24)
GFR, Estimated: >60 mL/min (ref >60)
Glucose, Bld: 127 mg/dL — ABNORMAL HIGH (ref 70–99)
Potassium: 4.6 mmol/L (ref 3.5–5.1)
Sodium: 137 mmol/L (ref 135–145)

## 2021-08-30 LAB — LACTATE DEHYDROGENASE: LDH: 172 U/L (ref 98–192)

## 2021-08-30 LAB — URINE CULTURE

## 2021-08-30 LAB — FERRITIN: Ferritin: 345 ng/mL — ABNORMAL HIGH (ref 24–336)

## 2021-08-30 LAB — CBG MONITORING, ED: Glucose-Capillary: 125 mg/dL — ABNORMAL HIGH (ref 70–99)

## 2021-08-30 LAB — C-REACTIVE PROTEIN: CRP: 8.1 mg/dL — ABNORMAL HIGH (ref <1.0)

## 2021-08-30 LAB — D-DIMER, QUANTITATIVE: D-Dimer, Quant: 2.37 ug/mL-FEU — ABNORMAL HIGH (ref 0.00–0.50)

## 2021-08-30 LAB — FIBRINOGEN: Fibrinogen: 783 mg/dL — ABNORMAL HIGH (ref 210–475)

## 2021-08-30 MED ORDER — REMDESIVIR 100 MG IV SOLR
100.0000 mg | Freq: Every day | INTRAVENOUS | Status: AC
  Administered 2021-08-31 – 2021-09-03 (×4): 100 mg via INTRAVENOUS
  Filled 2021-08-30 (×5): qty 20

## 2021-08-30 MED ORDER — POTASSIUM CHLORIDE 10 MEQ/100ML IV SOLN: 10.0000 meq | INTRAVENOUS | Status: DC

## 2021-08-30 MED ORDER — DAPAGLIFLOZIN PROPANEDIOL 10 MG PO TABS
10.0000 mg | ORAL_TABLET | Freq: Every day | ORAL | Status: DC
  Administered 2021-08-30 – 2021-09-07 (×9): 10 mg via ORAL
  Filled 2021-08-30 (×9): qty 1

## 2021-08-30 MED ORDER — SODIUM CHLORIDE 0.9 % IV SOLN: INTRAVENOUS | Status: AC

## 2021-08-30 MED ORDER — ONDANSETRON HCL 4 MG/2ML IJ SOLN
4.0000 mg | Freq: Once | INTRAMUSCULAR | Status: AC
  Administered 2021-08-30: 4 mg via INTRAVENOUS
  Filled 2021-08-30: qty 2

## 2021-08-30 MED ORDER — ACETAMINOPHEN 325 MG PO TABS
650.0000 mg | ORAL_TABLET | Freq: Four times a day (QID) | ORAL | Status: DC | PRN
  Administered 2021-09-05 – 2021-09-06 (×3): 650 mg via ORAL
  Filled 2021-08-30 (×3): qty 2

## 2021-08-30 MED ORDER — CEFDINIR 300 MG PO CAPS
300.0000 mg | ORAL_CAPSULE | Freq: Two times a day (BID) | ORAL | Status: DC
  Administered 2021-08-30 – 2021-09-01 (×5): 300 mg via ORAL
  Filled 2021-08-30 (×5): qty 1

## 2021-08-30 MED ORDER — SPIRONOLACTONE 25 MG PO TABS
25.0000 mg | ORAL_TABLET | Freq: Every day | ORAL | Status: DC
  Administered 2021-08-30 – 2021-08-31 (×2): 25 mg via ORAL
  Filled 2021-08-30 (×2): qty 1

## 2021-08-30 MED ORDER — ASPIRIN EC 81 MG PO TBEC: 81.0000 mg | DELAYED_RELEASE_TABLET | Freq: Every day | ORAL | Status: DC

## 2021-08-30 MED ORDER — SACUBITRIL-VALSARTAN 24-26 MG PO TABS
1.0000 | ORAL_TABLET | Freq: Two times a day (BID) | ORAL | Status: DC
  Administered 2021-08-30 (×2): 1 via ORAL
  Filled 2021-08-30 (×2): qty 1

## 2021-08-30 MED ORDER — ATORVASTATIN CALCIUM 80 MG PO TABS
80.0000 mg | ORAL_TABLET | Freq: Every day | ORAL | Status: DC
  Administered 2021-08-30 – 2021-09-10 (×12): 80 mg via ORAL
  Filled 2021-08-30 (×5): qty 1
  Filled 2021-08-30: qty 2
  Filled 2021-08-30 (×6): qty 1

## 2021-08-30 MED ORDER — APIXABAN 5 MG PO TABS
5.0000 mg | ORAL_TABLET | Freq: Two times a day (BID) | ORAL | Status: DC
  Administered 2021-08-30 – 2021-09-10 (×23): 5 mg via ORAL
  Filled 2021-08-30 (×23): qty 1

## 2021-08-30 MED ORDER — SACUBITRIL-VALSARTAN 24-26 MG PO TABS
1.0000 | ORAL_TABLET | Freq: Two times a day (BID) | ORAL | Status: DC
  Administered 2021-08-31: 1 via ORAL
  Filled 2021-08-30: qty 1

## 2021-08-30 MED ORDER — INSULIN GLARGINE-YFGN 100 UNIT/ML ~~LOC~~ SOLN
10.0000 [IU] | Freq: Every day | SUBCUTANEOUS | Status: DC
  Administered 2021-08-31: 10 [IU] via SUBCUTANEOUS
  Filled 2021-08-30 (×2): qty 0.1

## 2021-08-30 MED ORDER — VITAMIN B-12 1000 MCG PO TABS
1000.0000 ug | ORAL_TABLET | Freq: Every day | ORAL | Status: DC
  Administered 2021-08-30 – 2021-09-10 (×12): 1000 ug via ORAL
  Filled 2021-08-30 (×12): qty 1

## 2021-08-30 MED ORDER — SODIUM CHLORIDE 0.9 % IV SOLN
200.0000 mg | Freq: Once | INTRAVENOUS | Status: AC
  Administered 2021-08-30: 200 mg via INTRAVENOUS
  Filled 2021-08-30: qty 40

## 2021-08-30 MED ORDER — CARVEDILOL 6.25 MG PO TABS
6.2500 mg | ORAL_TABLET | Freq: Two times a day (BID) | ORAL | Status: DC
  Administered 2021-08-30 – 2021-09-06 (×15): 6.25 mg via ORAL
  Filled 2021-08-30 (×9): qty 1
  Filled 2021-08-30: qty 2
  Filled 2021-08-30 (×6): qty 1

## 2021-08-30 NOTE — ED Notes (Signed)
Shawn [name] daughter [phone] would like an update on the patient

## 2021-08-30 NOTE — ED Notes (Signed)
Unable to get enough blood for all labs, nurse will put in for IV consult and get remainder of blood

## 2021-08-30 NOTE — Progress Notes (Addendum)
PROGRESS NOTE    Shawn [name]  [id number] DOB: December 11, 1951 DOA: 08/29/2021 PCP: [name], PA-C  Brief Narrative:Shawn [name] is a 70 y.o. male with medical history significant of type 2 diabetes, hypertension, PVD status post right BKA and left AKA, hyperlipidemia.  He was recently admitted to the hospital 9/8 for acute onset right-sided weakness, right facial droop, and confusion.  Found to have left cerebral infarct likely secondary to cardiomyopathy and left atrial thrombus.  He had a COVID test done earlier 9/15 which resulted after his discharge and was positive.  As such, his nursing facility sent him back to the ED as they do not have a COVID unit.  Assessment & Plan:   COVID-19 positive SARS-CoV-2 PCR test done yesterday 9/15 prior to discharge came back positive -Has a cough today, appears slightly lethargic and tachypneic -Chest x-ray on 9/14 was unremarkable, will repeat CXR today, no evidence of hypoxia on admission -Unfortunately phlebotomy and IV team were unable to draw his labs despite multiple attempts  -Discontinue molnupiravir, will start 3-day course of IV remdesivir -Airborne and contact precautions -Monitor for complications, already on Eliquis for LA thrombus  Left cerebral infarct  -likely due to cardiomyopathy, LA thrombus Admitted with Right-sided weakness, confusion, seen by neurology in consultation, we were unable to obtain an MRI due to foreign body -LDL 142, hemoglobin A1c was 10.4 -2D echo noted EF of 30%, global hypokinesis and grade 2 diastolic dysfunction, TEE noted large left atrial thrombus, followed by cardiology and started on Eliquis, aspirin and Plavix were discontinued after discussion with neurology -Plan was for SNF for rehab just prior to this readmission -FU with [facility] neurology in 4 to 6 weeks.   Hypertension Stable. -Continue Entresto and Coreg   Type 2 diabetes Recent A1c 10.4.  His blood glucose was poorly controlled  during recent hospitalization and he was started on Semglee insulin and Farxiga. -Continue Semglee and Farxiga   Large left atrial appendage thrombus -Continue Eliquis   Hyperlipidemia -Continue Lipitor   New cardiomyopathy, systolic and diastolic heart failure Recent TTE showing EF 25-30%, global hypokinesis with apical akinesis, and grade 2 diastolic dysfunction.  TEE showing EF 30-35% and RWMA.  Currently euvolemic on exam. -Continue carvedilol, Entresto, Aldactone, and [place].  Cardiology planning on outpatient cath.   PVD -Status post right BKA and left AKA.   UTI UA done 9/14 showing positive nitrite, small amount of leukocytes, and many bacteria.  He was treated with IV ceftriaxone while hospitalized and discharged on oral cefdinir x3 days.  Not febrile at present. -Continue cefdinir. Urine culture pending.,  Routine labs all pending   DVT prophylaxis: Eliquis Code Status: Full code Family Communication: No family at bedside, will update daughter Disposition Plan:  Status is: Observation  The patient will require care spanning > 2 midnights and should be moved to inpatient because: Unsafe d/c plan, severity of illness  Dispo: The patient is from: Home              Anticipated d/c is to: SNF              Patient currently is not medically stable to d/c.   Difficult to place patient No   Consultants:    Procedures:   Antimicrobials:    Subjective: -Vomited this morning, appears somewhat drowsy  Objective: Vitals:   08/30/21 0900 08/30/21 0915 08/30/21 1059 08/30/21 1100  BP: 130/80   126/79  Pulse: 85 85 89 88  Resp: (!) 27  20 (!) 23 (!) 28  Temp:      TempSrc:      SpO2: 98% 99% 97% 97%   No intake or output data in the 24 hours ending 08/30/21 1141 There were no vitals filed for this visit.  Examination:  General exam: Chronically ill male, sitting up in bed, somnolent but is arousable, oriented to self and partly to place only, cognitive deficits  noted CVS: S1-S2, regular rate rhythm Lungs: Poor air movement bilaterally, few scattered rhonchi otherwise clear Abdomen: Soft, nontender, bowel sounds present  Extremities: Right BKA, left AKA Neuro: Mild left leg weakness  Psychiatry: Flat affect    Data Reviewed:   CBC: Recent Labs  Lab 08/24/21 0840 08/27/21 0354 08/28/21 1055 08/29/21 0331  WBC 8.3 6.6 8.7 7.6  HGB 13.3 13.1 13.4 13.1  HCT 40.6 38.9* 38.5* 37.9*  MCV 98.1 96.5 95.5 95.2  PLT 312 258 262 [id number]   Basic Metabolic Panel: Recent Labs  Lab 08/25/21 0245 08/26/21 0332 08/27/21 0354 08/28/21 0352 08/29/21 0331  NA 136 134* 131* 134* 135  K 3.1* 3.5 3.5 3.6 3.3*  CL 105 102 99 97* 103  CO2 23 21* 20* 22 20*  GLUCOSE 138* 149* 112* 133* 106*  BUN '9 11 10 15 14  '$ CREATININE 0.95 1.03 1.03 1.18 1.13  CALCIUM 9.1 8.7* 8.5* 9.0 8.7*   GFR: Estimated Creatinine Clearance: 53.4 mL/min (by C-G formula based on SCr of 1.13 mg/dL). Liver Function Tests: No results for input(s): AST, ALT, ALKPHOS, BILITOT, PROT, ALBUMIN in the last 168 hours. No results for input(s): LIPASE, AMYLASE in the last 168 hours. No results for input(s): AMMONIA in the last 168 hours. Coagulation Profile: No results for input(s): INR, PROTIME in the last 168 hours. Cardiac Enzymes: No results for input(s): CKTOTAL, CKMB, CKMBINDEX, TROPONINI in the last 168 hours. BNP (last 3 results) No results for input(s): PROBNP in the last 8760 hours. HbA1C: No results for input(s): HGBA1C in the last 72 hours. CBG: Recent Labs  Lab 08/29/21 0020 08/29/21 0355 08/29/21 0800 08/29/21 1155 08/30/21 1012  GLUCAP 109* 120* 130* 97 125*   Lipid Profile: No results for input(s): CHOL, HDL, LDLCALC, TRIG, CHOLHDL, LDLDIRECT in the last 72 hours. Thyroid Function Tests: No results for input(s): TSH, T4TOTAL, FREET4, T3FREE, THYROIDAB in the last 72 hours. Anemia Panel: Recent Labs    08/30/21 0854  FERRITIN 345*   Urine analysis:     Component Value Date/Time   COLORURINE YELLOW 08/28/2021 1027   APPEARANCEUR CLEAR 08/28/2021 1027   LABSPEC 1.015 08/28/2021 1027   PHURINE 5.5 08/28/2021 1027   GLUCOSEU >=500 (A) 08/28/2021 1027   HGBUR TRACE (A) 08/28/2021 1027   BILIRUBINUR NEGATIVE 08/28/2021 1027   KETONESUR 40 (A) 08/28/2021 1027   PROTEINUR NEGATIVE 08/28/2021 1027   NITRITE POSITIVE (A) 08/28/2021 1027   LEUKOCYTESUR SMALL (A) 08/28/2021 1027   Sepsis Labs: '@LABRCNTIP'$ (procalcitonin:4,lacticidven:4)  ) Recent Results (from the past 240 hour(s))  Resp Panel by RT-PCR (Flu A&B, Covid) Nasopharyngeal Swab     Status: None   Collection Time: 08/22/21  1:13 AM   Specimen: Nasopharyngeal Swab; Nasopharyngeal(NP) swabs in vial transport medium  Result Value Ref Range Status   SARS Coronavirus 2 by RT PCR NEGATIVE NEGATIVE Final    Comment: (NOTE) SARS-CoV-2 target nucleic acids are NOT DETECTED.  The SARS-CoV-2 RNA is generally detectable in upper respiratory specimens during the acute phase of infection. The lowest concentration of SARS-CoV-2 viral copies this assay can  detect is 138 copies/mL. A negative result does not preclude SARS-Cov-2 infection and should not be used as the sole basis for treatment or other patient management decisions. A negative result may occur with  improper specimen collection/handling, submission of specimen other than nasopharyngeal swab, presence of viral mutation(s) within the areas targeted by this assay, and inadequate number of viral copies(<138 copies/mL). A negative result must be combined with clinical observations, patient history, and epidemiological information. The expected result is Negative.  Fact Sheet for Patients:  EntrepreneurPulse.com.au  Fact Sheet for Healthcare Providers:  IncredibleEmployment.be  This test is no t yet approved or cleared by the [place] FDA and  has been authorized for detection and/or  diagnosis of SARS-CoV-2 by FDA under an Emergency Use Authorization (EUA). This EUA will remain  in effect (meaning this test can be used) for the duration of the COVID-19 declaration under Section 564(b)(1) of the Act, 21 U.S.C.section 360bbb-3(b)(1), unless the authorization is terminated  or revoked sooner.       Influenza A by PCR NEGATIVE NEGATIVE Final   Influenza B by PCR NEGATIVE NEGATIVE Final    Comment: (NOTE) The Xpert Xpress SARS-CoV-2/FLU/RSV plus assay is intended as an aid in the diagnosis of influenza from Nasopharyngeal swab specimens and should not be used as a sole basis for treatment. Nasal washings and aspirates are unacceptable for Xpert Xpress SARS-CoV-2/FLU/RSV testing.  Fact Sheet for Patients: EntrepreneurPulse.com.au  Fact Sheet for Healthcare Providers: IncredibleEmployment.be  This test is not yet approved or cleared by the [place] FDA and has been authorized for detection and/or diagnosis of SARS-CoV-2 by FDA under an Emergency Use Authorization (EUA). This EUA will remain in effect (meaning this test can be used) for the duration of the COVID-19 declaration under Section 564(b)(1) of the Act, 21 U.S.C. section 360bbb-3(b)(1), unless the authorization is terminated or revoked.  Performed at [facility], [place] [address] [place], [place] [address]   Resp Panel by RT-PCR (Flu A&B, Covid) Nasopharyngeal Swab     Status: Abnormal   Collection Time: 08/29/21  9:55 AM   Specimen: Nasopharyngeal Swab; Nasopharyngeal(NP) swabs in vial transport medium  Result Value Ref Range Status   SARS Coronavirus 2 by RT PCR POSITIVE (A) NEGATIVE Final    Comment: PT DISCHARGED PER RN (NOTE) SARS-CoV-2 target nucleic acids are DETECTED.  The SARS-CoV-2 RNA is generally detectable in upper respiratory specimens during the acute phase of infection. Positive results are indicative of the presence of the identified  virus, but do not rule out bacterial infection or co-infection with other pathogens not detected by the test. Clinical correlation with patient history and other diagnostic information is necessary to determine patient infection status. The expected result is Negative.  Fact Sheet for Patients: EntrepreneurPulse.com.au  Fact Sheet for Healthcare Providers: IncredibleEmployment.be  This test is not yet approved or cleared by the [place] FDA and  has been authorized for detection and/or diagnosis of SARS-CoV-2 by FDA under an Emergency Use Authorization (EUA).  This EUA will remain in effect (meaning this test can be used) for the duration of  the COVID-19 declaration under Sect ion 564(b)(1) of the Act, 21 U.S.C. section 360bbb-3(b)(1), unless the authorization is terminated or revoked sooner.     Influenza A by PCR NEGATIVE NEGATIVE Final   Influenza B by PCR NEGATIVE NEGATIVE Final    Comment: (NOTE) The Xpert Xpress SARS-CoV-2/FLU/RSV plus assay is intended as an aid in the diagnosis of influenza from Nasopharyngeal swab  specimens and should not be used as a sole basis for treatment. Nasal washings and aspirates are unacceptable for Xpert Xpress SARS-CoV-2/FLU/RSV testing.  Fact Sheet for Patients: EntrepreneurPulse.com.au  Fact Sheet for Healthcare Providers: IncredibleEmployment.be  This test is not yet approved or cleared by the [place] FDA and has been authorized for detection and/or diagnosis of SARS-CoV-2 by FDA under an Emergency Use Authorization (EUA). This EUA will remain in effect (meaning this test can be used) for the duration of the COVID-19 declaration under Section 564(b)(1) of the Act, 21 U.S.C. section 360bbb-3(b)(1), unless the authorization is terminated or revoked.  Performed at [facility], [place] [address], [place], [place] [address]   Urine Culture      Status: Abnormal   Collection Time: 08/29/21 11:50 AM   Specimen: Urine, Clean Catch  Result Value Ref Range Status   Specimen Description URINE, CLEAN CATCH  Final   Special Requests   Final    NONE Performed at [facility], [place] [address] [place], [place] [address]    Culture MULTIPLE SPECIES PRESENT, SUGGEST RECOLLECTION (A)  Final   Report Status 08/30/2021 FINAL  Final    Scheduled Meds:  apixaban  5 mg Oral BID   atorvastatin  80 mg Oral Daily   carvedilol  6.25 mg Oral BID WC   cefdinir  300 mg Oral BID   dapagliflozin propanediol  10 mg Oral Daily   insulin glargine-yfgn  10 Units Subcutaneous Daily   molnupiravir EUA  4 capsule Oral BID   [START ON 08/31/2021] sacubitril-valsartan  1 tablet Oral BID   spironolactone  25 mg Oral Daily   vitamin B-12  1,000 mcg Oral Daily   Continuous Infusions:   LOS: 0 days    Time spent: 53mn    PDomenic Polite MD [facility]   08/30/2021, 11:41 AM

## 2021-08-30 NOTE — ED Notes (Signed)
Help pull patient up in the bed

## 2021-08-30 NOTE — ED Notes (Signed)
IV team RN established IV access but unable to obtain blood for labs

## 2021-08-30 NOTE — ED Notes (Signed)
Secure chat sent to EDP that lab and IV team were unable to obtain labs

## 2021-08-30 NOTE — Social Work (Addendum)
11:10 am  CSW received a return call from [facility]. Admissions director noted that they believed pt needed treatment for Covid symptoms as he was running a fever and lethargic when he arrived to the facility. Facility stating they can take pt back after he has been quarantined for 10 days, as they cannot take pt's with covid right now. TOC will continue to follow for DC planning.  Pt with unplanned readmission due to positive covid test. CSW attempted to follow up with [facility] to determine when they would be able to admit patient and discuss any further barriers. CSW left a voicemail for admission [facility]. CSW was notified that pt may be readmitted into hospital for treatment. TOC will continue to follow for discharge planning.

## 2021-08-31 LAB — GLUCOSE, CAPILLARY
Glucose-Capillary: 191 mg/dL — ABNORMAL HIGH (ref 70–99)
Glucose-Capillary: 196 mg/dL — ABNORMAL HIGH (ref 70–99)
Glucose-Capillary: 175 mg/dL — ABNORMAL HIGH (ref 70–99)

## 2021-08-31 LAB — COMPREHENSIVE METABOLIC PANEL
ALT: 30 U/L (ref 0–44)
AST: 38 U/L (ref 15–41)
Albumin: 2.8 g/dL — ABNORMAL LOW (ref 3.5–5.0)
Alkaline Phosphatase: 72 U/L (ref 38–126)
Anion gap: 22 — ABNORMAL HIGH (ref 5–15)
BUN: 26 mg/dL — ABNORMAL HIGH (ref 8–23)
CO2: 10 mmol/L — ABNORMAL LOW (ref 22–32)
Calcium: 9.4 mg/dL (ref 8.9–10.3)
Chloride: 105 mmol/L (ref 98–111)
Creatinine, Ser: 1.55 mg/dL — ABNORMAL HIGH (ref 0.61–1.24)
GFR, Estimated: 48 mL/min — ABNORMAL LOW (ref >60)
Glucose, Bld: 185 mg/dL — ABNORMAL HIGH (ref 70–99)
Potassium: 5.3 mmol/L — ABNORMAL HIGH (ref 3.5–5.1)
Sodium: 137 mmol/L (ref 135–145)
Total Bilirubin: 1.7 mg/dL — ABNORMAL HIGH (ref 0.3–1.2)
Total Protein: 7.3 g/dL (ref 6.5–8.1)

## 2021-08-31 LAB — CBC
HCT: 44.1 % (ref 39.0–52.0)
Hemoglobin: 14.4 g/dL (ref 13.0–17.0)
MCH: 32.2 pg (ref 26.0–34.0)
MCHC: 32.7 g/dL (ref 30.0–36.0)
MCV: 98.7 fL (ref 80.0–100.0)
Platelets: 318 10*3/uL (ref 150–400)
RBC: 4.47 MIL/uL (ref 4.22–5.81)
RDW: 14.9 % (ref 11.5–15.5)
WBC: 16.9 10*3/uL — ABNORMAL HIGH (ref 4.0–10.5)
nRBC: 0 % (ref 0.0–0.2)

## 2021-08-31 LAB — FERRITIN: Ferritin: 389 ng/mL — ABNORMAL HIGH (ref 24–336)

## 2021-08-31 LAB — D-DIMER, QUANTITATIVE: D-Dimer, Quant: 1.85 ug/mL-FEU — ABNORMAL HIGH (ref 0.00–0.50)

## 2021-08-31 LAB — C-REACTIVE PROTEIN: CRP: 15.9 mg/dL — ABNORMAL HIGH (ref <1.0)

## 2021-08-31 MED ORDER — SODIUM CHLORIDE 0.9 % IV SOLN: INTRAVENOUS | Status: DC

## 2021-08-31 MED ORDER — SODIUM ZIRCONIUM CYCLOSILICATE 10 G PO PACK
10.0000 g | PACK | Freq: Once | ORAL | Status: AC
  Administered 2021-08-31: 10 g via ORAL
  Filled 2021-08-31: qty 1

## 2021-08-31 MED ORDER — SODIUM CHLORIDE 0.9 % IV BOLUS
250.0000 mL | Freq: Once | INTRAVENOUS | Status: AC
  Administered 2021-08-31: 250 mL via INTRAVENOUS

## 2021-08-31 MED ORDER — ONDANSETRON HCL 4 MG/2ML IJ SOLN
4.0000 mg | Freq: Four times a day (QID) | INTRAMUSCULAR | Status: DC | PRN
  Administered 2021-08-31 – 2021-09-06 (×5): 4 mg via INTRAVENOUS
  Filled 2021-08-31 (×5): qty 2

## 2021-08-31 MED ORDER — SODIUM CHLORIDE 0.9 % IV SOLN: INTRAVENOUS | Status: AC

## 2021-08-31 MED ORDER — INSULIN ASPART 100 UNIT/ML IJ SOLN
0.0000 [IU] | Freq: Three times a day (TID) | INTRAMUSCULAR | Status: DC
  Administered 2021-08-31 (×2): 2 [IU] via SUBCUTANEOUS
  Administered 2021-09-01 (×3): 1 [IU] via SUBCUTANEOUS
  Administered 2021-09-02 (×3): 2 [IU] via SUBCUTANEOUS
  Administered 2021-09-03 (×3): 1 [IU] via SUBCUTANEOUS
  Administered 2021-09-04: 2 [IU] via SUBCUTANEOUS
  Administered 2021-09-04: 1 [IU] via SUBCUTANEOUS
  Administered 2021-09-05 (×2): 2 [IU] via SUBCUTANEOUS
  Administered 2021-09-05: 1 [IU] via SUBCUTANEOUS

## 2021-08-31 MED ORDER — INSULIN GLARGINE-YFGN 100 UNIT/ML ~~LOC~~ SOLN
6.0000 [IU] | Freq: Every day | SUBCUTANEOUS | Status: DC
  Administered 2021-09-01 – 2021-09-05 (×5): 6 [IU] via SUBCUTANEOUS
  Filled 2021-08-31 (×6): qty 0.06

## 2021-08-31 NOTE — Plan of Care (Signed)
  Problem: Education: Goal: Knowledge of General Education information will improve Description: Including pain rating scale, medication(s)/side effects and non-pharmacologic comfort measures Outcome: Progressing   Problem: Clinical Measurements: Goal: Ability to maintain clinical measurements within normal limits will improve Outcome: Progressing Goal: Will remain free from infection Outcome: Progressing Goal: Diagnostic test results will improve Outcome: Progressing Goal: Respiratory complications will improve Outcome: Progressing Goal: Cardiovascular complication will be avoided Outcome: Progressing   Problem: Coping: Goal: Level of anxiety will decrease Outcome: Progressing   Problem: Elimination: Goal: Will not experience complications related to bowel motility Outcome: Progressing Goal: Will not experience complications related to urinary retention Outcome: Progressing   Problem: Pain Managment: Goal: General experience of comfort will improve Outcome: Progressing   Problem: Safety: Goal: Ability to remain free from injury will improve Outcome: Progressing   Problem: Skin Integrity: Goal: Risk for impaired skin integrity will decrease Outcome: Progressing   Problem: Health Behavior/Discharge Planning: Goal: Ability to manage health-related needs will improve Outcome: Not Progressing   Problem: Activity: Goal: Risk for activity intolerance will decrease Outcome: Not Progressing   Problem: Nutrition: Goal: Adequate nutrition will be maintained Outcome: Not Progressing

## 2021-08-31 NOTE — Progress Notes (Addendum)
PROGRESS NOTE    Shawn [name]  [id number] DOB: 01-Jan-1951 DOA: 08/29/2021 PCP: [name], PA-C  Brief Narrative:Shawn [name] is a 70 y.o. male with medical history significant of type 2 diabetes, hypertension, PVD status post right BKA and left AKA, hyperlipidemia.  He was recently admitted to the hospital 9/8 for acute onset right-sided weakness, right facial droop, and confusion.  Found to have left cerebral infarct likely secondary to cardiomyopathy and left atrial thrombus.  He had a COVID test done earlier 9/15 which resulted after his discharge and was positive.  As such, his nursing facility sent him back to the ED as they do not have a COVID unit.  Assessment & Plan:   COVID-19 positive SARS-CoV-2 PCR test done yesterday 9/15 prior to discharge came back positive -Intermittent cough, some nausea and vomiting, more awake and alert today, less tachypneic -Chest x-ray 9/14 & 9/16 without pneumonia -no evidence hypoxia, inflammatory markers however significantly elevated -Discontinued molnupiravir, started on IV remdesivir, day 2 -Airborne and contact precautions -Monitor for complications, already on Eliquis for LA thrombus  Left cerebral infarct  -likely due to cardiomyopathy, LA thrombus Admitted with Right-sided weakness, confusion, seen by neurology in consultation, we were unable to obtain an MRI due to foreign body -LDL 142, hemoglobin A1c was 10.4 -2D echo noted EF of 30%, global hypokinesis and grade 2 diastolic dysfunction, TEE noted large left atrial thrombus, followed by cardiology and started on Eliquis, aspirin and Plavix were discontinued after discussion with neurology -Plan was for SNF for rehab just prior to this readmission -FU with [facility] neurology in 4 to 6 weeks.   Acute kidney injury -Labs with worsening creatinine and potassium -Due to GI losses, poor p.o. intake, hold Entresto and Aldactone today, gentle IV fluids -BMP in  am  Hypertension Stable. -Continue Coreg, hold Entresto   Type 2 diabetes Recent A1c 10.4.  His blood glucose was poorly controlled during recent hospitalization and he was started on Semglee insulin and Farxiga. -Continue Semglee and Farxiga -Will drop insulin dose as p.o. intake is poor   Large left atrial appendage thrombus -Continue Eliquis   Hyperlipidemia -Continue Lipitor   New cardiomyopathy, systolic and diastolic heart failure Recent TTE showing EF 25-30%, global hypokinesis with apical akinesis, and grade 2 diastolic dysfunction.  TEE showing EF 30-35% and RWMA.  Currently euvolemic on exam. -Continue carvedilol,and Farxiga.  Cardiology planning on outpatient cath. Delene Loll, aldactone held with worsening AKI, hyperkalemia   PVD -Status post right BKA and left AKA.   UTI UA done 9/14 showing positive nitrite, small amount of leukocytes, and many bacteria.  He was treated with IV ceftriaxone while hospitalized and discharged on oral cefdinir x3 days.  Not febrile at present. -Continue cefdinir.  Urine culture with multiple species, discontinue antibiotics after today's dose   DVT prophylaxis: Eliquis Code Status: Full code Family Communication: No family at bedside,  updated daughter [facility] yesterday Disposition Plan:  Status is: inpatient because: Unsafe d/c plan, severity of illness  Dispo: The patient is from: Home              Anticipated d/c is to: SNF              Patient currently is not medically stable to d/c.   Difficult to place patient No   Consultants:   Procedures:   Antimicrobials:    Subjective: -no events overnight, awake and alert, but talks very less  Objective: Vitals:   08/30/21 1245 08/30/21 1423  08/30/21 2017 08/31/21 0856  BP: 129/81 133/88 91/62 (!) 99/57  Pulse: 91 93 92 80  Resp: (!) '26 14 16   '$ Temp:  97.8 F (36.6 C) 98.1 F (36.7 C)   TempSrc:  Axillary Axillary   SpO2: 97% 98% 99% 100%    Intake/Output Summary  (Last 24 hours) at 08/31/2021 1153 Last data filed at 08/30/2021 2034 Gross per 24 hour  Intake 353.16 ml  Output --  Net 353.16 ml   There were no vitals filed for this visit.  Examination:  General exam: Chronically, sitting up in bed, awake, alert but less interactive, answers a few questions, 1-2 words CVs: S1-S2, regular rate rhythm Lungs: Quiet air movement bilaterally, no significant rhonchi Abdomen: Soft, nontender , BS present Extremities: Right BKA, left AKA Neuro: Mild R leg weakness  Psychiatry: Flat affect    Data Reviewed:   CBC: Recent Labs  Lab 08/27/21 0354 08/28/21 1055 08/29/21 0331 08/30/21 1506 08/31/21 0200  WBC 6.6 8.7 7.6 17.8* 16.9*  NEUTROABS  --   --   --  15.1*  --   HGB 13.1 13.4 13.1 14.7 14.4  HCT 38.9* 38.5* 37.9* 45.0 44.1  MCV 96.5 95.5 95.2 100.7* 98.7  PLT 258 262 268 302 0000000   Basic Metabolic Panel: Recent Labs  Lab 08/27/21 0354 08/28/21 0352 08/29/21 0331 08/30/21 1506 08/31/21 0200  NA 131* 134* 135 137 137  K 3.5 3.6 3.3* 4.6 5.3*  CL 99 97* 103 104 105  CO2 20* 22 20* 13* 10*  GLUCOSE 112* 133* 106* 127* 185*  BUN '10 15 14 21 '$ 26*  CREATININE 1.03 1.18 1.13 1.28* 1.55*  CALCIUM 8.5* 9.0 8.7* 9.3 9.4   GFR: Estimated Creatinine Clearance: 38.9 mL/min (A) (by C-G formula based on SCr of 1.55 mg/dL (H)). Liver Function Tests: Recent Labs  Lab 08/31/21 0200  AST 38  ALT 30  ALKPHOS 72  BILITOT 1.7*  PROT 7.3  ALBUMIN 2.8*   No results for input(s): LIPASE, AMYLASE in the last 168 hours. No results for input(s): AMMONIA in the last 168 hours. Coagulation Profile: No results for input(s): INR, PROTIME in the last 168 hours. Cardiac Enzymes: No results for input(s): CKTOTAL, CKMB, CKMBINDEX, TROPONINI in the last 168 hours. BNP (last 3 results) No results for input(s): PROBNP in the last 8760 hours. HbA1C: No results for input(s): HGBA1C in the last 72 hours. CBG: Recent Labs  Lab 08/29/21 0355  08/29/21 0800 08/29/21 1155 08/30/21 1012 08/31/21 0859  GLUCAP 120* 130* 97 125* 191*   Lipid Profile: No results for input(s): CHOL, HDL, LDLCALC, TRIG, CHOLHDL, LDLDIRECT in the last 72 hours. Thyroid Function Tests: No results for input(s): TSH, T4TOTAL, FREET4, T3FREE, THYROIDAB in the last 72 hours. Anemia Panel: Recent Labs    08/30/21 0854 08/31/21 0200  FERRITIN 345* 389*   Urine analysis:    Component Value Date/Time   COLORURINE YELLOW 08/28/2021 [place] 08/28/2021 1027   LABSPEC 1.015 08/28/2021 1027   PHURINE 5.5 08/28/2021 1027   GLUCOSEU >=500 (A) 08/28/2021 1027   HGBUR TRACE (A) 08/28/2021 1027   BILIRUBINUR NEGATIVE 08/28/2021 1027   KETONESUR 40 (A) 08/28/2021 1027   PROTEINUR NEGATIVE 08/28/2021 1027   NITRITE POSITIVE (A) 08/28/2021 1027   LEUKOCYTESUR SMALL (A) 08/28/2021 1027   Sepsis Labs: '@LABRCNTIP'$ (procalcitonin:4,lacticidven:4)  ) Recent Results (from the past 240 hour(s))  Resp Panel by RT-PCR (Flu A&B, Covid) Nasopharyngeal Swab     Status: None  Collection Time: 08/22/21  1:13 AM   Specimen: Nasopharyngeal Swab; Nasopharyngeal(NP) swabs in vial transport medium  Result Value Ref Range Status   SARS Coronavirus 2 by RT PCR NEGATIVE NEGATIVE Final    Comment: (NOTE) SARS-CoV-2 target nucleic acids are NOT DETECTED.  The SARS-CoV-2 RNA is generally detectable in upper respiratory specimens during the acute phase of infection. The lowest concentration of SARS-CoV-2 viral copies this assay can detect is 138 copies/mL. A negative result does not preclude SARS-Cov-2 infection and should not be used as the sole basis for treatment or other patient management decisions. A negative result may occur with  improper specimen collection/handling, submission of specimen other than nasopharyngeal swab, presence of viral mutation(s) within the areas targeted by this assay, and inadequate number of viral copies(<138 copies/mL). A  negative result must be combined with clinical observations, patient history, and epidemiological information. The expected result is Negative.  Fact Sheet for Patients:  EntrepreneurPulse.com.au  Fact Sheet for Healthcare Providers:  IncredibleEmployment.be  This test is no t yet approved or cleared by the [place] FDA and  has been authorized for detection and/or diagnosis of SARS-CoV-2 by FDA under an Emergency Use Authorization (EUA). This EUA will remain  in effect (meaning this test can be used) for the duration of the COVID-19 declaration under Section 564(b)(1) of the Act, 21 U.S.C.section 360bbb-3(b)(1), unless the authorization is terminated  or revoked sooner.       Influenza A by PCR NEGATIVE NEGATIVE Final   Influenza B by PCR NEGATIVE NEGATIVE Final    Comment: (NOTE) The Xpert Xpress SARS-CoV-2/FLU/RSV plus assay is intended as an aid in the diagnosis of influenza from Nasopharyngeal swab specimens and should not be used as a sole basis for treatment. Nasal washings and aspirates are unacceptable for Xpert Xpress SARS-CoV-2/FLU/RSV testing.  Fact Sheet for Patients: EntrepreneurPulse.com.au  Fact Sheet for Healthcare Providers: IncredibleEmployment.be  This test is not yet approved or cleared by the [place] FDA and has been authorized for detection and/or diagnosis of SARS-CoV-2 by FDA under an Emergency Use Authorization (EUA). This EUA will remain in effect (meaning this test can be used) for the duration of the COVID-19 declaration under Section 564(b)(1) of the Act, 21 U.S.C. section 360bbb-3(b)(1), unless the authorization is terminated or revoked.  Performed at [facility], [place] [address], [place], [place] [address]   Resp Panel by RT-PCR (Flu A&B, Covid) Nasopharyngeal Swab     Status: Abnormal   Collection Time: 08/29/21  9:55 AM   Specimen: Nasopharyngeal  Swab; Nasopharyngeal(NP) swabs in vial transport medium  Result Value Ref Range Status   SARS Coronavirus 2 by RT PCR POSITIVE (A) NEGATIVE Final    Comment: PT DISCHARGED PER RN (NOTE) SARS-CoV-2 target nucleic acids are DETECTED.  The SARS-CoV-2 RNA is generally detectable in upper respiratory specimens during the acute phase of infection. Positive results are indicative of the presence of the identified virus, but do not rule out bacterial infection or co-infection with other pathogens not detected by the test. Clinical correlation with patient history and other diagnostic information is necessary to determine patient infection status. The expected result is Negative.  Fact Sheet for Patients: EntrepreneurPulse.com.au  Fact Sheet for Healthcare Providers: IncredibleEmployment.be  This test is not yet approved or cleared by the [place] FDA and  has been authorized for detection and/or diagnosis of SARS-CoV-2 by FDA under an Emergency Use Authorization (EUA).  This EUA will remain in effect (meaning this test can  be used) for the duration of  the COVID-19 declaration under Sect ion 564(b)(1) of the Act, 21 U.S.C. section 360bbb-3(b)(1), unless the authorization is terminated or revoked sooner.     Influenza A by PCR NEGATIVE NEGATIVE Final   Influenza B by PCR NEGATIVE NEGATIVE Final    Comment: (NOTE) The Xpert Xpress SARS-CoV-2/FLU/RSV plus assay is intended as an aid in the diagnosis of influenza from Nasopharyngeal swab specimens and should not be used as a sole basis for treatment. Nasal washings and aspirates are unacceptable for Xpert Xpress SARS-CoV-2/FLU/RSV testing.  Fact Sheet for Patients: EntrepreneurPulse.com.au  Fact Sheet for Healthcare Providers: IncredibleEmployment.be  This test is not yet approved or cleared by the [place] FDA and has been authorized for detection  and/or diagnosis of SARS-CoV-2 by FDA under an Emergency Use Authorization (EUA). This EUA will remain in effect (meaning this test can be used) for the duration of the COVID-19 declaration under Section 564(b)(1) of the Act, 21 U.S.C. section 360bbb-3(b)(1), unless the authorization is terminated or revoked.  Performed at [facility], [place] [address] [place], [place] [address]   Urine Culture     Status: Abnormal   Collection Time: 08/29/21 11:50 AM   Specimen: Urine, Clean Catch  Result Value Ref Range Status   Specimen Description URINE, CLEAN CATCH  Final   Special Requests   Final    NONE Performed at [facility], [place] [address], [place], [place] [address]    Culture MULTIPLE SPECIES PRESENT, SUGGEST RECOLLECTION (A)  Final   Report Status 08/30/2021 FINAL  Final    Scheduled Meds:  apixaban  5 mg Oral BID   atorvastatin  80 mg Oral Daily   carvedilol  6.25 mg Oral BID WC   cefdinir  300 mg Oral BID   dapagliflozin propanediol  10 mg Oral Daily   insulin aspart  0-9 Units Subcutaneous TID WC   [START ON 09/01/2021] insulin glargine-yfgn  6 Units Subcutaneous Daily   sodium zirconium cyclosilicate  10 g Oral Once   vitamin B-12  1,000 mcg Oral Daily   Continuous Infusions:  sodium chloride     remdesivir 100 mg in NS 100 mL 100 mg (08/31/21 0902)     LOS: 1 day    Time spent: 67mn    PDomenic Polite MD [facility]   08/31/2021, 11:53 AM

## 2021-09-01 LAB — COMPREHENSIVE METABOLIC PANEL
ALT: 21 U/L (ref 0–44)
AST: 21 U/L (ref 15–41)
Albumin: 2.5 g/dL — ABNORMAL LOW (ref 3.5–5.0)
Alkaline Phosphatase: 66 U/L (ref 38–126)
Anion gap: 15 (ref 5–15)
BUN: 24 mg/dL — ABNORMAL HIGH (ref 8–23)
CO2: 15 mmol/L — ABNORMAL LOW (ref 22–32)
Calcium: 8.5 mg/dL — ABNORMAL LOW (ref 8.9–10.3)
Chloride: 103 mmol/L (ref 98–111)
Creatinine, Ser: 1.3 mg/dL — ABNORMAL HIGH (ref 0.61–1.24)
GFR, Estimated: 59 mL/min — ABNORMAL LOW (ref >60)
Glucose, Bld: 147 mg/dL — ABNORMAL HIGH (ref 70–99)
Potassium: 4.1 mmol/L (ref 3.5–5.1)
Sodium: 133 mmol/L — ABNORMAL LOW (ref 135–145)
Total Bilirubin: 1.1 mg/dL (ref 0.3–1.2)
Total Protein: 6.9 g/dL (ref 6.5–8.1)

## 2021-09-01 LAB — CBC
HCT: 42.9 % (ref 39.0–52.0)
Hemoglobin: 14.3 g/dL (ref 13.0–17.0)
MCH: 31.7 pg (ref 26.0–34.0)
MCHC: 33.3 g/dL (ref 30.0–36.0)
MCV: 95.1 fL (ref 80.0–100.0)
Platelets: 322 10*3/uL (ref 150–400)
RBC: 4.51 MIL/uL (ref 4.22–5.81)
RDW: 14.7 % (ref 11.5–15.5)
WBC: 19.5 10*3/uL — ABNORMAL HIGH (ref 4.0–10.5)
nRBC: 0 % (ref 0.0–0.2)

## 2021-09-01 LAB — GLUCOSE, CAPILLARY
Glucose-Capillary: 156 mg/dL — ABNORMAL HIGH (ref 70–99)
Glucose-Capillary: 133 mg/dL — ABNORMAL HIGH (ref 70–99)
Glucose-Capillary: 143 mg/dL — ABNORMAL HIGH (ref 70–99)
Glucose-Capillary: 110 mg/dL — ABNORMAL HIGH (ref 70–99)

## 2021-09-01 LAB — FERRITIN: Ferritin: 516 ng/mL — ABNORMAL HIGH (ref 24–336)

## 2021-09-01 LAB — C-REACTIVE PROTEIN: CRP: 24.3 mg/dL — ABNORMAL HIGH (ref <1.0)

## 2021-09-01 LAB — D-DIMER, QUANTITATIVE: D-Dimer, Quant: 1.04 ug/mL-FEU — ABNORMAL HIGH (ref 0.00–0.50)

## 2021-09-01 MED ORDER — SODIUM CHLORIDE 0.9 % IV SOLN: INTRAVENOUS | Status: AC

## 2021-09-01 NOTE — Progress Notes (Addendum)
PROGRESS NOTE    Shawn [name]  [id number] DOB: [birth year] DOA: 08/29/2021 PCP: [name], PA-C  Brief Narrative:Shawn [name] is a 70 y.o. male with medical history significant of type 2 diabetes, hypertension, PVD status post right BKA and left AKA, hyperlipidemia.  He was recently admitted to the hospital 9/8 for acute onset right-sided weakness, right facial droop, and confusion.  Found to have left cerebral infarct likely secondary to cardiomyopathy and left atrial thrombus.  He had a COVID test done earlier 9/15 which resulted after his discharge and was positive.  As such, his nursing facility sent him back to the ED as they do not have a COVID unit.  Assessment & Plan:   COVID-19 positive SARS-CoV-2 PCR test done yesterday 9/15 prior to discharge came back positive -Intermittent cough, no vomiting today, pO intake very poor -Chest x-ray 9/14 & 9/16 without pneumonia -no evidence hypoxia, inflammatory markers however significantly elevated -Discontinued molnupiravir, started on IV remdesivir, day 3/5 -Airborne and contact precautions -Monitor for complications, already on Eliquis for LA thrombus  Left cerebral infarct  -likely due to cardiomyopathy, LA thrombus Admitted with Right-sided weakness, confusion, seen by neurology in consultation, we were unable to obtain an MRI due to foreign body -LDL 142, hemoglobin A1c was 10.4 -2D echo noted EF of 30%, global hypokinesis and grade 2 diastolic dysfunction, TEE noted large left atrial thrombus, followed by cardiology and started on Eliquis, aspirin and Plavix were discontinued after discussion with neurology -Plan was for SNF for rehab just prior to this readmission -FU with [facility] neurology in 4 to 6 weeks.   Acute kidney injury -Labs with worsening creatinine and potassium -Due to GI losses, poor p.o. intake, hold Entresto and Aldactone today, continue gentle IV fluids -BMP in am  Hypertension Stable. -Continue  Coreg, hold Entresto   Type 2 diabetes Recent A1c 10.4.  His blood glucose was poorly controlled during recent hospitalization and he was started on Semglee insulin and Farxiga. -Continue Semglee and Farxiga -dropped insulin dose as p.o. intake is poor   Large left atrial appendage thrombus -Continue Eliquis   Hyperlipidemia -Continue Lipitor   New cardiomyopathy, systolic and diastolic heart failure Recent TTE showing EF 25-30%, global hypokinesis with apical akinesis, and grade 2 diastolic dysfunction.  TEE showing EF 30-35% and RWMA.  Currently euvolemic on exam. -Continue carvedilol,and Farxiga.  Cardiology planning on outpatient cath. Delene Loll, aldactone held with worsening AKI, hyperkalemia   PVD -Status post right BKA and left AKA.   UTI UA done 9/14 showing positive nitrite, small amount of leukocytes, and many bacteria.  He was treated with IV ceftriaxone while hospitalized and discharged on oral cefdinir x3 days.  Not febrile at present. -resumed cefdinir on admission, Urine culture with multiple species, discontinue antibiotics    DVT prophylaxis: Eliquis Code Status: DNR, discussed with pt and updated daughter [place] Mam Family Communication: No family at bedside,  updated daughter Over[place] Mam  Disposition Plan:  Status is: inpatient because: Unsafe d/c plan, severity of illness  Dispo: The patient is from: Home              Anticipated d/c is to: SNF              Patient currently is not medically stable to d/c.   Difficult to place patient No   Consultants:   Procedures:   Antimicrobials:    Subjective: -no events overnight, awake and alert, but talks very less  Objective: Vitals:   08/31/21 1641 08/31/21  1739 09/01/21 0300 09/01/21 0809  BP: (!) 84/58 96/60 (!) 90/58 (!) 98/58  Pulse:  85 82 88  Resp:  '17 18 17  '$ Temp:   98 F (36.7 C) 97.7 F (36.5 C)  TempSrc:   Oral Oral  SpO2:   98% 100%    Intake/Output Summary (Last 24 hours) at 09/01/2021  1111 Last data filed at 08/31/2021 2200 Gross per 24 hour  Intake 1388.65 ml  Output 175 ml  Net 1213.65 ml   There were no vitals filed for this visit.  Examination:  General exam: Chronically, sitting up in bed, awake, alert but less interactive, answers a few questions, 1-2 words CVs: S1-S2, regular rate rhythm Lungs: Quiet air movement bilaterally, no significant rhonchi Abdomen: Soft, nontender , BS present Extremities: Right BKA, left AKA Neuro: Mild R leg weakness  Psychiatry: Flat affect    Data Reviewed:   CBC: Recent Labs  Lab 08/28/21 1055 08/29/21 0331 08/30/21 1506 08/31/21 0200 09/01/21 0715  WBC 8.7 7.6 17.8* 16.9* 19.5*  NEUTROABS  --   --  15.1*  --   --   HGB 13.4 13.1 14.7 14.4 14.3  HCT 38.5* 37.9* 45.0 44.1 42.9  MCV 95.5 95.2 100.7* 98.7 95.1  PLT 262 268 302 318 [id number]   Basic Metabolic Panel: Recent Labs  Lab 08/28/21 0352 08/29/21 0331 08/30/21 1506 08/31/21 0200 09/01/21 0715  NA 134* 135 137 137 133*  K 3.6 3.3* 4.6 5.3* 4.1  CL 97* 103 104 105 103  CO2 22 20* 13* 10* 15*  GLUCOSE 133* 106* 127* 185* 147*  BUN '15 14 21 '$ 26* 24*  CREATININE 1.18 1.13 1.28* 1.55* 1.30*  CALCIUM 9.0 8.7* 9.3 9.4 8.5*   GFR: Estimated Creatinine Clearance: 46.4 mL/min (A) (by C-G formula based on SCr of 1.3 mg/dL (H)). Liver Function Tests: Recent Labs  Lab 08/31/21 0200 09/01/21 0715  AST 38 21  ALT 30 21  ALKPHOS 72 66  BILITOT 1.7* 1.1  PROT 7.3 6.9  ALBUMIN 2.8* 2.5*   No results for input(s): LIPASE, AMYLASE in the last 168 hours. No results for input(s): AMMONIA in the last 168 hours. Coagulation Profile: No results for input(s): INR, PROTIME in the last 168 hours. Cardiac Enzymes: No results for input(s): CKTOTAL, CKMB, CKMBINDEX, TROPONINI in the last 168 hours. BNP (last 3 results) No results for input(s): PROBNP in the last 8760 hours. HbA1C: No results for input(s): HGBA1C in the last 72 hours. CBG: Recent Labs  Lab  08/30/21 1012 08/31/21 0859 08/31/21 1142 08/31/21 2128 09/01/21 0807  GLUCAP 125* 191* 196* 175* 133*   Lipid Profile: No results for input(s): CHOL, HDL, LDLCALC, TRIG, CHOLHDL, LDLDIRECT in the last 72 hours. Thyroid Function Tests: No results for input(s): TSH, T4TOTAL, FREET4, T3FREE, THYROIDAB in the last 72 hours. Anemia Panel: Recent Labs    08/31/21 0200 09/01/21 0715  FERRITIN 389* 516*   Urine analysis:    Component Value Date/Time   COLORURINE YELLOW 08/28/2021 [place] 08/28/2021 1027   LABSPEC 1.015 08/28/2021 1027   PHURINE 5.5 08/28/2021 1027   GLUCOSEU >=500 (A) 08/28/2021 1027   HGBUR TRACE (A) 08/28/2021 1027   BILIRUBINUR NEGATIVE 08/28/2021 1027   KETONESUR 40 (A) 08/28/2021 1027   PROTEINUR NEGATIVE 08/28/2021 1027   NITRITE POSITIVE (A) 08/28/2021 1027   LEUKOCYTESUR SMALL (A) 08/28/2021 1027   Sepsis Labs: '@LABRCNTIP'$ (procalcitonin:4,lacticidven:4)  ) Recent Results (from the past 240 hour(s))  Resp Panel by RT-PCR (Flu  A&B, Covid) Nasopharyngeal Swab     Status: Abnormal   Collection Time: 08/29/21  9:55 AM   Specimen: Nasopharyngeal Swab; Nasopharyngeal(NP) swabs in vial transport medium  Result Value Ref Range Status   SARS Coronavirus 2 by RT PCR POSITIVE (A) NEGATIVE Final    Comment: PT DISCHARGED PER RN (NOTE) SARS-CoV-2 target nucleic acids are DETECTED.  The SARS-CoV-2 RNA is generally detectable in upper respiratory specimens during the acute phase of infection. Positive results are indicative of the presence of the identified virus, but do not rule out bacterial infection or co-infection with other pathogens not detected by the test. Clinical correlation with patient history and other diagnostic information is necessary to determine patient infection status. The expected result is Negative.  Fact Sheet for Patients: EntrepreneurPulse.com.au  Fact Sheet for Healthcare  Providers: IncredibleEmployment.be  This test is not yet approved or cleared by the [place] FDA and  has been authorized for detection and/or diagnosis of SARS-CoV-2 by FDA under an Emergency Use Authorization (EUA).  This EUA will remain in effect (meaning this test can be used) for the duration of  the COVID-19 declaration under Sect ion 564(b)(1) of the Act, 21 U.S.C. section 360bbb-3(b)(1), unless the authorization is terminated or revoked sooner.     Influenza A by PCR NEGATIVE NEGATIVE Final   Influenza B by PCR NEGATIVE NEGATIVE Final    Comment: (NOTE) The Xpert Xpress SARS-CoV-2/FLU/RSV plus assay is intended as an aid in the diagnosis of influenza from Nasopharyngeal swab specimens and should not be used as a sole basis for treatment. Nasal washings and aspirates are unacceptable for Xpert Xpress SARS-CoV-2/FLU/RSV testing.  Fact Sheet for Patients: EntrepreneurPulse.com.au  Fact Sheet for Healthcare Providers: IncredibleEmployment.be  This test is not yet approved or cleared by the [place] FDA and has been authorized for detection and/or diagnosis of SARS-CoV-2 by FDA under an Emergency Use Authorization (EUA). This EUA will remain in effect (meaning this test can be used) for the duration of the COVID-19 declaration under Section 564(b)(1) of the Act, 21 U.S.C. section 360bbb-3(b)(1), unless the authorization is terminated or revoked.  Performed at [facility], [place] [address] [place], [place] [address]   Urine Culture     Status: Abnormal   Collection Time: 08/29/21 11:50 AM   Specimen: Urine, Clean Catch  Result Value Ref Range Status   Specimen Description URINE, CLEAN CATCH  Final   Special Requests   Final    NONE Performed at [facility], [place] [address], [place], [place] [address]    Culture MULTIPLE SPECIES PRESENT, SUGGEST RECOLLECTION (A)  Final   Report Status  08/30/2021 FINAL  Final    Scheduled Meds:  apixaban  5 mg Oral BID   atorvastatin  80 mg Oral Daily   carvedilol  6.25 mg Oral BID WC   cefdinir  300 mg Oral BID   dapagliflozin propanediol  10 mg Oral Daily   insulin aspart  0-9 Units Subcutaneous TID WC   insulin glargine-yfgn  6 Units Subcutaneous Daily   vitamin B-12  1,000 mcg Oral Daily   Continuous Infusions:  remdesivir 100 mg in NS 100 mL 100 mg (08/31/21 0902)     LOS: 2 days    Time spent: 37mn   PDomenic Polite MD [facility]   09/01/2021, 11:11 AM

## 2021-09-02 ENCOUNTER — Inpatient Hospital Stay (HOSPITAL_COMMUNITY): Payer: Medicare HMO

## 2021-09-02 LAB — COMPREHENSIVE METABOLIC PANEL
ALT: 21 U/L (ref 0–44)
AST: 21 U/L (ref 15–41)
Albumin: 2.2 g/dL — ABNORMAL LOW (ref 3.5–5.0)
Alkaline Phosphatase: 61 U/L (ref 38–126)
Anion gap: 16 — ABNORMAL HIGH (ref 5–15)
BUN: 26 mg/dL — ABNORMAL HIGH (ref 8–23)
CO2: 14 mmol/L — ABNORMAL LOW (ref 22–32)
Calcium: 8.4 mg/dL — ABNORMAL LOW (ref 8.9–10.3)
Chloride: 103 mmol/L (ref 98–111)
Creatinine, Ser: 1.34 mg/dL — ABNORMAL HIGH (ref 0.61–1.24)
GFR, Estimated: 57 mL/min — ABNORMAL LOW (ref >60)
Glucose, Bld: 166 mg/dL — ABNORMAL HIGH (ref 70–99)
Potassium: 4.1 mmol/L (ref 3.5–5.1)
Sodium: 133 mmol/L — ABNORMAL LOW (ref 135–145)
Total Bilirubin: 1.1 mg/dL (ref 0.3–1.2)
Total Protein: 6.1 g/dL — ABNORMAL LOW (ref 6.5–8.1)

## 2021-09-02 LAB — CBC
HCT: 40.3 % (ref 39.0–52.0)
Hemoglobin: 13.7 g/dL (ref 13.0–17.0)
MCH: 32.7 pg (ref 26.0–34.0)
MCHC: 34 g/dL (ref 30.0–36.0)
MCV: 96.2 fL (ref 80.0–100.0)
Platelets: 302 10*3/uL (ref 150–400)
RBC: 4.19 MIL/uL — ABNORMAL LOW (ref 4.22–5.81)
RDW: 14.8 % (ref 11.5–15.5)
WBC: 20.7 10*3/uL — ABNORMAL HIGH (ref 4.0–10.5)
nRBC: 0 % (ref 0.0–0.2)

## 2021-09-02 LAB — GLUCOSE, CAPILLARY
Glucose-Capillary: 154 mg/dL — ABNORMAL HIGH (ref 70–99)
Glucose-Capillary: 173 mg/dL — ABNORMAL HIGH (ref 70–99)
Glucose-Capillary: 176 mg/dL — ABNORMAL HIGH (ref 70–99)
Glucose-Capillary: 135 mg/dL — ABNORMAL HIGH (ref 70–99)

## 2021-09-02 LAB — PROCALCITONIN: Procalcitonin: 3.3 ng/mL

## 2021-09-02 LAB — FERRITIN: Ferritin: 573 ng/mL — ABNORMAL HIGH (ref 24–336)

## 2021-09-02 LAB — D-DIMER, QUANTITATIVE: D-Dimer, Quant: 1.01 ug/mL-FEU — ABNORMAL HIGH (ref 0.00–0.50)

## 2021-09-02 LAB — C-REACTIVE PROTEIN: CRP: 21.5 mg/dL — ABNORMAL HIGH (ref <1.0)

## 2021-09-02 MED ORDER — SODIUM CHLORIDE 0.9 % IV SOLN: INTRAVENOUS | Status: AC

## 2021-09-02 NOTE — TOC Initial Note (Signed)
Transition of Care [facility]) - Initial/Assessment Note    Patient Details  Name: Shawn [name] MRN: [id number] Date of Birth: Sep 25, 1951  Transition of [facility]) CM/SW Contact:    Joanne Chars, LCSW Phone Number: 09/02/2021, 10:56 AM  Clinical Narrative:   CSW spoke with pt on the phone, confirmed that he would like to return to [facility] once covid quarantine is completed.  Permission given to speak with daughter Overton Mam.  Pt is vaccinated for covid but not boosted.  CSW spoke with Arbie Cookey at [facility] and confirmed pt can return.  Arbie Cookey completed the [facility] auth before, will resubmit auth over this coming weekend and just needs updated PT/OT notes sent at the end of this week.    CSW LM for daughter Overton Mam to update her.                  Expected Discharge Plan: Skilled Nursing Facility Barriers to Discharge: Other (must enter comment) (completion of covid quarantine)   Patient Goals and CMS Choice   CMS Medicare.gov Compare Post Acute Care list provided to::  (na-wants to return to [facility])    Expected Discharge Plan and Services Expected Discharge Plan: [place] Acute Care Choice: [place] arrangements for the past 2 months: Single Family Home                                      Prior Living Arrangements/Services Living arrangements for the past 2 months: Single Family Home Lives with:: Self Patient language and need for interpreter reviewed:: Yes        Need for Family Participation in Patient Care: Yes (Comment) Care giver support system in place?: Yes (comment) Current home services: DME Criminal Activity/Legal Involvement Pertinent to Current Situation/Hospitalization: No - Comment as needed  Activities of Daily Living      Permission Sought/Granted                  Emotional Assessment Appearance::  (unknown: phone assessment due to covid  +) Attitude/Demeanor/Rapport: Engaged Affect (typically observed): Appropriate Orientation: : Oriented to Self, Oriented to Place Alcohol / Substance Use: Not Applicable Psych Involvement: No (comment)  Admission diagnosis:  Hypoxia [R09.02] Fever [R50.9] COVID-19 virus infection [U07.1] COVID-19 [U07.1] Patient Active Problem List   Diagnosis Date Noted   CVA (cerebral vascular accident) ([place]) 08/30/2021   HLD (hyperlipidemia) 08/30/2021   COVID-19 08/29/2021   Cerebral embolism with cerebral infarction 08/23/2021   Right sided weakness 08/22/2021   Essential hypertension 08/22/2021   PAD (peripheral artery disease) ([place]) 01/08/2019   Knee injury, right, initial encounter 01/07/2019   History of amputation of right lower extremity through tibia and fibula ([place]) 08/18/2018   Type 2 diabetes mellitus with circulatory disorder, without long-term current use of insulin ([place]) 07/23/2018   Osteomyelitis of hand, left, acute ([place]) 02/18/2018   PCP:  [name], PA-C Pharmacy:   [id number] [place]Phone: [phone]: [phone] [id number] [address] [facility] [address] [place]Phone: [phone]: [phone]    Social Determinants of Health (SDOH) Interventions    Readmission Risk Interventions No flowsheet data found.

## 2021-09-02 NOTE — Plan of Care (Signed)

## 2021-09-02 NOTE — Progress Notes (Addendum)
PROGRESS NOTE    Shawn [name]  [id number]:[id number] DOB: [birth year] DOA: 08/29/2021 PCP: [name], PA-C  Brief Narrative:Shawn [name] is a 70 y.o. male with medical history significant of type 2 diabetes, hypertension, PVD status post right BKA and left AKA, hyperlipidemia.  He was recently admitted to the hospital 9/8 for acute onset right-sided weakness, right facial droop, and confusion.  Found to have left cerebral infarct likely secondary to cardiomyopathy and left atrial thrombus. Followed by Neurology and cardiology, started on CHF meds, Eliquis and discharged to rehab.  He had a COVID test done earlier 9/15 which resulted after his discharge and was positive.  As such, his nursing facility sent him back to the ED as they do not have a COVID unit.  Assessment & Plan:   COVID-19 positive SARS-CoV-2 PCR test done 9/15 prior to discharge came back positive -Intermittent cough, no vomiting yesterday and today, p.o. intake was very poor, finally improving -Chest x-ray 9/14 & 9/16 without pneumonia -No evidence of hypoxia, inflammatory markers remain significantly elevated -Continue IV remdesivir, day 4/5 -Airborne and contact precautions -Monitor for complications, already on Eliquis for LA thrombus  Leukocytosis -Likely reactive secondary to above, CRP remains considerably elevated as well, afebrile, check procalcitonin, just completed antibiotics for UTI -Monitor clinically  Left cerebral infarct  -likely due to cardiomyopathy, LA thrombus Admitted with Right-sided weakness, confusion, seen by neurology in consultation, we were unable to obtain an MRI due to foreign body -LDL 142, hemoglobin A1c was 10.4 -2D echo noted EF of 30%, global hypokinesis and grade 2 diastolic dysfunction, TEE noted large left atrial thrombus, followed by cardiology and started on Eliquis, aspirin and Plavix were discontinued after discussion with neurology -Plan was for SNF for rehab just prior to this  readmission -FU with [facility] neurology in 4 to 6 weeks.   Acute kidney injury -Labs with worsening creatinine and potassium -Due to GI losses, poor p.o. intake, hold Entresto and Aldactone,  continue gentle IV fluids -BMP in am  Hypertension Stable. -Continue Coreg, hold Entresto   Type 2 diabetes Recent A1c 10.4.  His blood glucose was poorly controlled during recent hospitalization and he was started on Semglee insulin and Farxiga. -Continue Semglee and Kevil are stable   Large left atrial appendage thrombus -Continue Eliquis   Hyperlipidemia -Continue Lipitor   New cardiomyopathy, systolic and diastolic heart failure Recent TTE showing EF 25-30%, global hypokinesis with apical akinesis, and grade 2 diastolic dysfunction.  TEE showing EF 30-35% and RWMA.  Currently euvolemic on exam. -Continue carvedilol,and Farxiga.  Cardiology planning on outpatient cath. Delene Loll, aldactone held with worsening AKI, hyperkalemia -Resume above slowly as blood pressure, kidney function permits   PVD -Status post right BKA and left AKA.   UTI UA done 9/14 showing positive nitrite, small amount of leukocytes, and many bacteria.  He was treated with IV ceftriaxone while hospitalized and discharged on oral cefdinir x3 days.  Not febrile at present. -resumed cefdinir on admission, Urine culture with multiple species, discontinued antibiotics after 4 days   DVT prophylaxis: Eliquis Code Status: DNR, discussed with pt and updated daughter Overton Mam Family Communication: No family at bedside,  updated daughter Overton Mam yesterday Disposition Plan:  Status is: inpatient because: Unsafe d/c plan, severity of illness  Dispo: The patient is from: Home              Anticipated d/c is to: SNF              Patient  currently is not medically stable to d/c.   Difficult to place patient No   Consultants:   Procedures:   Antimicrobials:    Subjective: -More awake and alert today, denies  any fevers or chills, denies any shortness of breath or cough, denies dysuria or nausea or vomiting, feels tired, oral intake is improving  Objective: Vitals:   09/01/21 0809 09/02/21 0336 09/02/21 0734 09/02/21 1000  BP: (!) 98/58 105/66 112/70 118/77  Pulse: 88 92 88 87  Resp: 17 16 18    Temp: 97.7 F (36.5 C) 98 F (36.7 C) 97.9 F (36.6 C)   TempSrc: Oral  Oral   SpO2: 100% 98% 99%     Intake/Output Summary (Last 24 hours) at 09/02/2021 1358 Last data filed at 09/02/2021 1334 Gross per 24 hour  Intake 800 ml  Output --  Net 800 ml   There were no vitals filed for this visit.  Examination:  General exam: Chronically ill male appears much older than stated age, more awake and alert today, answers some questions, oriented to self and place CVS: S1-S2, regular rate rhythm Lungs: Poor air movement bilaterally Abdomen: Soft, nontender, bowel sounds present Extremities: Right BKA, left AKA Neuro: Mild R leg weakness  Psychiatry: Flat affect    Data Reviewed:   CBC: Recent Labs  Lab 08/29/21 0331 08/30/21 1506 08/31/21 0200 09/01/21 0715 09/02/21 0509  WBC 7.6 17.8* 16.9* 19.5* 20.7*  NEUTROABS  --  15.1*  --   --   --   HGB 13.1 14.7 14.4 14.3 13.7  HCT 37.9* 45.0 44.1 42.9 40.3  MCV 95.2 100.7* 98.7 95.1 96.2  PLT 268 302 318 322 601   Basic Metabolic Panel: Recent Labs  Lab 08/29/21 0331 08/30/21 1506 08/31/21 0200 09/01/21 0715 09/02/21 0509  NA 135 137 137 133* 133*  K 3.3* 4.6 5.3* 4.1 4.1  CL 103 104 105 103 103  CO2 20* 13* 10* 15* 14*  GLUCOSE 106* 127* 185* 147* 166*  BUN 14 21 26* 24* 26*  CREATININE 1.13 1.28* 1.55* 1.30* 1.34*  CALCIUM 8.7* 9.3 9.4 8.5* 8.4*   GFR: Estimated Creatinine Clearance: 45 mL/min (A) (by C-G formula based on SCr of 1.34 mg/dL (H)). Liver Function Tests: Recent Labs  Lab 08/31/21 0200 09/01/21 0715 09/02/21 0509  AST 38 21 21  ALT 30 21 21   ALKPHOS 72 66 61  BILITOT 1.7* 1.1 1.1  PROT 7.3 6.9 6.1*   ALBUMIN 2.8* 2.5* 2.2*   No results for input(s): LIPASE, AMYLASE in the last 168 hours. No results for input(s): AMMONIA in the last 168 hours. Coagulation Profile: No results for input(s): INR, PROTIME in the last 168 hours. Cardiac Enzymes: No results for input(s): CKTOTAL, CKMB, CKMBINDEX, TROPONINI in the last 168 hours. BNP (last 3 results) No results for input(s): PROBNP in the last 8760 hours. HbA1C: No results for input(s): HGBA1C in the last 72 hours. CBG: Recent Labs  Lab 09/01/21 0807 09/01/21 1113 09/01/21 1956 09/02/21 0731 09/02/21 1147  GLUCAP 133* 143* 110* 154* 173*   Lipid Profile: No results for input(s): CHOL, HDL, LDLCALC, TRIG, CHOLHDL, LDLDIRECT in the last 72 hours. Thyroid Function Tests: No results for input(s): TSH, T4TOTAL, FREET4, T3FREE, THYROIDAB in the last 72 hours. Anemia Panel: Recent Labs    09/01/21 0715 09/02/21 0509  FERRITIN 516* 573*   Urine analysis:    Component Value Date/Time   COLORURINE YELLOW 08/28/2021 [place] 08/28/2021 1027   LABSPEC 1.015  08/28/2021 1027   PHURINE 5.5 08/28/2021 1027   GLUCOSEU >=500 (A) 08/28/2021 1027   HGBUR TRACE (A) 08/28/2021 1027   BILIRUBINUR NEGATIVE 08/28/2021 1027   KETONESUR 40 (A) 08/28/2021 1027   PROTEINUR NEGATIVE 08/28/2021 1027   NITRITE POSITIVE (A) 08/28/2021 1027   LEUKOCYTESUR SMALL (A) 08/28/2021 1027   Sepsis Labs: @LABRCNTIP (procalcitonin:4,lacticidven:4)  ) Recent Results (from the past 240 hour(s))  Resp Panel by RT-PCR (Flu A&B, Covid) Nasopharyngeal Swab     Status: Abnormal   Collection Time: 08/29/21  9:55 AM   Specimen: Nasopharyngeal Swab; Nasopharyngeal(NP) swabs in vial transport medium  Result Value Ref Range Status   SARS Coronavirus 2 by RT PCR POSITIVE (A) NEGATIVE Final    Comment: PT DISCHARGED PER RN (NOTE) SARS-CoV-2 target nucleic acids are DETECTED.  The SARS-CoV-2 RNA is generally detectable in upper  respiratory specimens during the acute phase of infection. Positive results are indicative of the presence of the identified virus, but do not rule out bacterial infection or co-infection with other pathogens not detected by the test. Clinical correlation with patient history and other diagnostic information is necessary to determine patient infection status. The expected result is Negative.  Fact Sheet for Patients: EntrepreneurPulse.com.au  Fact Sheet for Healthcare Providers: IncredibleEmployment.be  This test is not yet approved or cleared by the [place] FDA and  has been authorized for detection and/or diagnosis of SARS-CoV-2 by FDA under an Emergency Use Authorization (EUA).  This EUA will remain in effect (meaning this test can be used) for the duration of  the COVID-19 declaration under Sect ion 564(b)(1) of the Act, 21 U.S.C. section 360bbb-3(b)(1), unless the authorization is terminated or revoked sooner.     Influenza A by PCR NEGATIVE NEGATIVE Final   Influenza B by PCR NEGATIVE NEGATIVE Final    Comment: (NOTE) The Xpert Xpress SARS-CoV-2/FLU/RSV plus assay is intended as an aid in the diagnosis of influenza from Nasopharyngeal swab specimens and should not be used as a sole basis for treatment. Nasal washings and aspirates are unacceptable for Xpert Xpress SARS-CoV-2/FLU/RSV testing.  Fact Sheet for Patients: EntrepreneurPulse.com.au  Fact Sheet for Healthcare Providers: IncredibleEmployment.be  This test is not yet approved or cleared by the [place] FDA and has been authorized for detection and/or diagnosis of SARS-CoV-2 by FDA under an Emergency Use Authorization (EUA). This EUA will remain in effect (meaning this test can be used) for the duration of the COVID-19 declaration under Section 564(b)(1) of the Act, 21 U.S.C. section 360bbb-3(b)(1), unless the authorization is  terminated or revoked.  Performed at [facility], [place] [address], [place], [place] [address]   Urine Culture     Status: Abnormal   Collection Time: 08/29/21 11:50 AM   Specimen: Urine, Clean Catch  Result Value Ref Range Status   Specimen Description URINE, CLEAN CATCH  Final   Special Requests   Final    NONE Performed at [facility], [place] [address], [place], [place] [address]    Culture MULTIPLE SPECIES PRESENT, SUGGEST RECOLLECTION (A)  Final   Report Status 08/30/2021 FINAL  Final    Scheduled Meds:  apixaban  5 mg Oral BID   atorvastatin  80 mg Oral Daily   carvedilol  6.25 mg Oral BID WC   dapagliflozin propanediol  10 mg Oral Daily   insulin aspart  0-9 Units Subcutaneous TID WC   insulin glargine-yfgn  6 Units Subcutaneous Daily   vitamin B-12  1,000 mcg Oral  Daily   Continuous Infusions:  remdesivir 100 mg in NS 100 mL 100 mg (09/02/21 1248)     LOS: 3 days    Time spent: 72min   Domenic Polite, MD [facility]   09/02/2021, 1:58 PM

## 2021-09-03 LAB — CBC
HCT: 38 % — ABNORMAL LOW (ref 39.0–52.0)
Hemoglobin: 13.2 g/dL (ref 13.0–17.0)
MCH: 32.6 pg (ref 26.0–34.0)
MCHC: 34.7 g/dL (ref 30.0–36.0)
MCV: 93.8 fL (ref 80.0–100.0)
Platelets: 331 10*3/uL (ref 150–400)
RBC: 4.05 MIL/uL — ABNORMAL LOW (ref 4.22–5.81)
RDW: 14.9 % (ref 11.5–15.5)
WBC: 16.6 10*3/uL — ABNORMAL HIGH (ref 4.0–10.5)
nRBC: 0 % (ref 0.0–0.2)

## 2021-09-03 LAB — BASIC METABOLIC PANEL
Anion gap: 12 (ref 5–15)
BUN: 22 mg/dL (ref 8–23)
CO2: 19 mmol/L — ABNORMAL LOW (ref 22–32)
Calcium: 8.5 mg/dL — ABNORMAL LOW (ref 8.9–10.3)
Chloride: 100 mmol/L (ref 98–111)
Creatinine, Ser: 1.22 mg/dL (ref 0.61–1.24)
GFR, Estimated: >60 mL/min (ref >60)
Glucose, Bld: 160 mg/dL — ABNORMAL HIGH (ref 70–99)
Potassium: 3.5 mmol/L (ref 3.5–5.1)
Sodium: 131 mmol/L — ABNORMAL LOW (ref 135–145)

## 2021-09-03 LAB — GLUCOSE, CAPILLARY
Glucose-Capillary: 130 mg/dL — ABNORMAL HIGH (ref 70–99)
Glucose-Capillary: 145 mg/dL — ABNORMAL HIGH (ref 70–99)
Glucose-Capillary: 121 mg/dL — ABNORMAL HIGH (ref 70–99)
Glucose-Capillary: 116 mg/dL — ABNORMAL HIGH (ref 70–99)

## 2021-09-03 LAB — URINALYSIS, ROUTINE W REFLEX MICROSCOPIC
Glucose, UA: >=500 mg/dL — AB
Ketones, ur: 15 mg/dL — AB
Nitrite: NEGATIVE
Protein, ur: NEGATIVE mg/dL
Specific Gravity, Urine: 1.01 (ref 1.005–1.030)
pH: 6 (ref 5.0–8.0)

## 2021-09-03 LAB — FERRITIN: Ferritin: 494 ng/mL — ABNORMAL HIGH (ref 24–336)

## 2021-09-03 LAB — PROCALCITONIN: Procalcitonin: 36.34 ng/mL

## 2021-09-03 LAB — URINALYSIS, MICROSCOPIC (REFLEX): Bacteria, UA: NONE SEEN

## 2021-09-03 LAB — C-REACTIVE PROTEIN: CRP: 21 mg/dL — ABNORMAL HIGH (ref <1.0)

## 2021-09-03 NOTE — Plan of Care (Signed)

## 2021-09-03 NOTE — Progress Notes (Addendum)
PROGRESS NOTE    Shawn [name]  HWE:[id number] DOB: January 03, 1951 DOA: 08/29/2021 PCP: [name], PA-C  Brief Narrative:Shawn [name] is a 70 y.o. male with medical history significant of type 2 diabetes, hypertension, PVD status post right BKA and left AKA, hyperlipidemia.  He was recently admitted to the hospital 9/8 for acute onset right-sided weakness, right facial droop, and confusion.  Found to have left cerebral infarct likely secondary to cardiomyopathy and left atrial thrombus. Followed by Neurology and cardiology, started on CHF meds, Eliquis and discharged to rehab.  He had a COVID test done earlier 9/15 which resulted after his discharge and was positive.  As such, his nursing facility sent him back to the ED as they do not have a COVID unit.  Assessment & Plan:   COVID-19 positive SARS-CoV-2 PCR test done 9/15 prior to discharge came back positive -Intermittent cough, nausea vomiting and poor p.o. intake for first few days, now improving, no further vomiting, oral intake is better -Chest x-ray 9/14 & 9/16 without pneumonia -No evidence of hypoxia, inflammatory markers remain significantly elevated -Continue IV remdesivir, day 5/5 today -Airborne and contact precautions -Monitor for complications, already on Eliquis for LA thrombus  Leukocytosis -Likely reactive secondary to above, CRP remains considerably elevated as well, afebrile, procalcitonin is very high, leukocytosis improving down to 16 from 20 K yesterday,, repeat chest x-ray is unremarkable, repeat UA pending,  just completed antibiotics for UTI, abdominal exam is benign -Patient continues to improve clinically considerably from last week  Left cerebral infarct  -likely due to cardiomyopathy, LA thrombus Admitted with Right-sided weakness, confusion, seen by neurology in consultation, we were unable to obtain an MRI due to foreign body -LDL 142, hemoglobin A1c was 10.4 -2D echo noted EF of 30%, global hypokinesis  and grade 2 diastolic dysfunction, TEE noted large left atrial thrombus, followed by cardiology and started on Eliquis, aspirin and Plavix were discontinued after discussion with neurology -Plan was for SNF for rehab just prior to this readmission -FU with [facility] neurology in 4 to 6 weeks.   Acute kidney injury -Labs with worsening creatinine and potassium -Due to GI losses, poor p.o. intake, hold Entresto and Aldactone, hydrated with gentle IV fluids, kidney function is improved, slowly restart Entresto and Aldactone as oral intake continues to improve  Hypertension Stable. -Continue Coreg, held [place] on account of AKI   Type 2 diabetes Recent A1c 10.4.  His blood glucose was poorly controlled during recent hospitalization and he was started on Semglee insulin and Farxiga. -Continue Semglee and [place] are stable   Large left atrial appendage thrombus -Continue Eliquis   Hyperlipidemia -Continue Lipitor   New cardiomyopathy, systolic and diastolic heart failure Recent TTE showing EF 25-30%, global hypokinesis with apical akinesis, and grade 2 diastolic dysfunction.  TEE showing EF 30-35% and RWMA.  Currently euvolemic on exam. -Continue carvedilol,and Farxiga.  Cardiology planning on outpatient cath. Delene Loll, aldactone held with worsening AKI, hyperkalemia -Resume above meds slowly as blood pressure, kidney function permits   PVD -Status post right BKA and left AKA.   UTI UA done 9/14 showing positive nitrite, small amount of leukocytes, and many bacteria.  He was treated with IV ceftriaxone while hospitalized and discharged on oral cefdinir x3 days.  Not febrile at present. -resumed cefdinir on admission, Urine culture with multiple species, discontinued antibiotics after 4 days   DVT prophylaxis: Eliquis Code Status: DNR, discussed with pt and updated daughter Overton Mam  Family Communication: No family at bedside,  updated daughter Overton Mam yesterday Disposition  Plan:  Status is: inpatient because: Unsafe d/c plan, severity of illness  Dispo: The patient is from: Home              Anticipated d/c is to: SNF              Patient currently is not medically stable to d/c.   Difficult to place patient No   Consultants:   Procedures:   Antimicrobials:    Subjective: -Feels better today, no further nausea or vomiting, starting to eat better, denies any cough congestion or shortness of breath, denies any diarrhea  Objective: Vitals:   09/03/21 0300 09/03/21 0919 09/03/21 1442 09/03/21 1442  BP: (!) 110/56 110/68 101/64 101/64  Pulse: 91 89 86 86  Resp: 18  20 20   Temp: 98 F (36.7 C)  98.7 F (37.1 C) 98.7 F (37.1 C)  TempSrc:   Oral Oral  SpO2: 97% 100% 97% 97%    Intake/Output Summary (Last 24 hours) at 09/03/2021 1457 Last data filed at 09/03/2021 1438 Gross per 24 hour  Intake 781.54 ml  Output 2250 ml  Net -1468.46 ml   There were no vitals filed for this visit.  Examination:  General exam: Chronically ill male appears older than stated age, more awake alert and interactive today, oriented to self and place CVS: S1-S2, regular rate rhythm Lungs: Improving air movement Abdomen: Soft, nontender, bowel sounds present  Extremities: Right BKA, left AKA Neuro: Mild R leg weakness  Psychiatry: Flat affect    Data Reviewed:   CBC: Recent Labs  Lab 08/30/21 1506 08/31/21 0200 09/01/21 0715 09/02/21 0509 09/03/21 0329  WBC 17.8* 16.9* 19.5* 20.7* 16.6*  NEUTROABS 15.1*  --   --   --   --   HGB 14.7 14.4 14.3 13.7 13.2  HCT 45.0 44.1 42.9 40.3 38.0*  MCV 100.7* 98.7 95.1 96.2 93.8  PLT 302 318 322 302 829   Basic Metabolic Panel: Recent Labs  Lab 08/30/21 1506 08/31/21 0200 09/01/21 0715 09/02/21 0509 09/03/21 0329  NA 137 137 133* 133* 131*  K 4.6 5.3* 4.1 4.1 3.5  CL 104 105 103 103 100  CO2 13* 10* 15* 14* 19*  GLUCOSE 127* 185* 147* 166* 160*  BUN 21 26* 24* 26* 22  CREATININE 1.28* 1.55* 1.30* 1.34*  1.22  CALCIUM 9.3 9.4 8.5* 8.4* 8.5*   GFR: Estimated Creatinine Clearance: 49.5 mL/min (by C-G formula based on SCr of 1.22 mg/dL). Liver Function Tests: Recent Labs  Lab 08/31/21 0200 09/01/21 0715 09/02/21 0509  AST 38 21 21  ALT 30 21 21   ALKPHOS 72 66 61  BILITOT 1.7* 1.1 1.1  PROT 7.3 6.9 6.1*  ALBUMIN 2.8* 2.5* 2.2*   No results for input(s): LIPASE, AMYLASE in the last 168 hours. No results for input(s): AMMONIA in the last 168 hours. Coagulation Profile: No results for input(s): INR, PROTIME in the last 168 hours. Cardiac Enzymes: No results for input(s): CKTOTAL, CKMB, CKMBINDEX, TROPONINI in the last 168 hours. BNP (last 3 results) No results for input(s): PROBNP in the last 8760 hours. HbA1C: No results for input(s): HGBA1C in the last 72 hours. CBG: Recent Labs  Lab 09/02/21 1147 09/02/21 1716 09/02/21 2035 09/03/21 0816 09/03/21 1427  GLUCAP 173* 176* 135* 130* 145*   Lipid Profile: No results for input(s): CHOL, HDL, LDLCALC, TRIG, CHOLHDL, LDLDIRECT in the last 72 hours. Thyroid Function Tests: No results for input(s): TSH, T4TOTAL, FREET4, T3FREE, THYROIDAB  in the last 72 hours. Anemia Panel: Recent Labs    09/02/21 0509 09/03/21 0329  FERRITIN 573* 494*   Urine analysis:    Component Value Date/Time   COLORURINE YELLOW 08/28/2021 1027   APPEARANCEUR CLEAR 08/28/2021 1027   LABSPEC 1.015 08/28/2021 1027   PHURINE 5.5 08/28/2021 1027   GLUCOSEU >=500 (A) 08/28/2021 1027   HGBUR TRACE (A) 08/28/2021 1027   BILIRUBINUR NEGATIVE 08/28/2021 1027   KETONESUR 40 (A) 08/28/2021 1027   PROTEINUR NEGATIVE 08/28/2021 1027   NITRITE POSITIVE (A) 08/28/2021 1027   LEUKOCYTESUR SMALL (A) 08/28/2021 1027   Sepsis Labs: @LABRCNTIP (procalcitonin:4,lacticidven:4)  ) Recent Results (from the past 240 hour(s))  Resp Panel by RT-PCR (Flu A&B, Covid) Nasopharyngeal Swab     Status: Abnormal   Collection Time: 08/29/21  9:55 AM   Specimen:  Nasopharyngeal Swab; Nasopharyngeal(NP) swabs in vial transport medium  Result Value Ref Range Status   SARS Coronavirus 2 by RT PCR POSITIVE (A) NEGATIVE Final    Comment: PT DISCHARGED PER RN (NOTE) SARS-CoV-2 target nucleic acids are DETECTED.  The SARS-CoV-2 RNA is generally detectable in upper respiratory specimens during the acute phase of infection. Positive results are indicative of the presence of the identified virus, but do not rule out bacterial infection or co-infection with other pathogens not detected by the test. Clinical correlation with patient history and other diagnostic information is necessary to determine patient infection status. The expected result is Negative.  Fact Sheet for Patients: EntrepreneurPulse.com.au  Fact Sheet for Healthcare Providers: IncredibleEmployment.be  This test is not yet approved or cleared by the [place] FDA and  has been authorized for detection and/or diagnosis of SARS-CoV-2 by FDA under an Emergency Use Authorization (EUA).  This EUA will remain in effect (meaning this test can be used) for the duration of  the COVID-19 declaration under Sect ion 564(b)(1) of the Act, 21 U.S.C. section 360bbb-3(b)(1), unless the authorization is terminated or revoked sooner.     Influenza A by PCR NEGATIVE NEGATIVE Final   Influenza B by PCR NEGATIVE NEGATIVE Final    Comment: (NOTE) The Xpert Xpress SARS-CoV-2/FLU/RSV plus assay is intended as an aid in the diagnosis of influenza from Nasopharyngeal swab specimens and should not be used as a sole basis for treatment. Nasal washings and aspirates are unacceptable for Xpert Xpress SARS-CoV-2/FLU/RSV testing.  Fact Sheet for Patients: EntrepreneurPulse.com.au  Fact Sheet for Healthcare Providers: IncredibleEmployment.be  This test is not yet approved or cleared by the [place] FDA and has been authorized for  detection and/or diagnosis of SARS-CoV-2 by FDA under an Emergency Use Authorization (EUA). This EUA will remain in effect (meaning this test can be used) for the duration of the COVID-19 declaration under Section 564(b)(1) of the Act, 21 U.S.C. section 360bbb-3(b)(1), unless the authorization is terminated or revoked.  Performed at [facility], [place] [address], [place], [place] [address]   Urine Culture     Status: Abnormal   Collection Time: 08/29/21 11:50 AM   Specimen: Urine, Clean Catch  Result Value Ref Range Status   Specimen Description URINE, CLEAN CATCH  Final   Special Requests   Final    NONE Performed at [facility], [place] [address] [place], [place] [address]    Culture MULTIPLE SPECIES PRESENT, SUGGEST RECOLLECTION (A)  Final   Report Status 08/30/2021 FINAL  Final    Scheduled Meds:  apixaban  5 mg Oral BID   atorvastatin  80 mg Oral  Daily   carvedilol  6.25 mg Oral BID WC   dapagliflozin propanediol  10 mg Oral Daily   insulin aspart  0-9 Units Subcutaneous TID WC   insulin glargine-yfgn  6 Units Subcutaneous Daily   vitamin B-12  1,000 mcg Oral Daily   Continuous Infusions:   LOS: 4 days    Time spent: 69min   Domenic Polite, MD [facility]   09/03/2021, 2:57 PM

## 2021-09-04 ENCOUNTER — Encounter (HOSPITAL_COMMUNITY): Payer: Medicare HMO

## 2021-09-04 LAB — CBC
HCT: 35.1 % — ABNORMAL LOW (ref 39.0–52.0)
Hemoglobin: 12.1 g/dL — ABNORMAL LOW (ref 13.0–17.0)
MCH: 32 pg (ref 26.0–34.0)
MCHC: 34.5 g/dL (ref 30.0–36.0)
MCV: 92.9 fL (ref 80.0–100.0)
Platelets: 308 10*3/uL (ref 150–400)
RBC: 3.78 MIL/uL — ABNORMAL LOW (ref 4.22–5.81)
RDW: 14.7 % (ref 11.5–15.5)
WBC: 14 10*3/uL — ABNORMAL HIGH (ref 4.0–10.5)
nRBC: 0 % (ref 0.0–0.2)

## 2021-09-04 LAB — BASIC METABOLIC PANEL
Anion gap: 11 (ref 5–15)
BUN: 23 mg/dL (ref 8–23)
CO2: 18 mmol/L — ABNORMAL LOW (ref 22–32)
Calcium: 8.1 mg/dL — ABNORMAL LOW (ref 8.9–10.3)
Chloride: 100 mmol/L (ref 98–111)
Creatinine, Ser: 1.37 mg/dL — ABNORMAL HIGH (ref 0.61–1.24)
GFR, Estimated: 56 mL/min — ABNORMAL LOW (ref >60)
Glucose, Bld: 114 mg/dL — ABNORMAL HIGH (ref 70–99)
Potassium: 3 mmol/L — ABNORMAL LOW (ref 3.5–5.1)
Sodium: 129 mmol/L — ABNORMAL LOW (ref 135–145)

## 2021-09-04 LAB — GLUCOSE, CAPILLARY
Glucose-Capillary: 119 mg/dL — ABNORMAL HIGH (ref 70–99)
Glucose-Capillary: 179 mg/dL — ABNORMAL HIGH (ref 70–99)
Glucose-Capillary: 129 mg/dL — ABNORMAL HIGH (ref 70–99)
Glucose-Capillary: 147 mg/dL — ABNORMAL HIGH (ref 70–99)

## 2021-09-04 LAB — PROCALCITONIN: Procalcitonin: 6.09 ng/mL

## 2021-09-04 LAB — FERRITIN: Ferritin: 658 ng/mL — ABNORMAL HIGH (ref 24–336)

## 2021-09-04 LAB — C-REACTIVE PROTEIN: CRP: 21.3 mg/dL — ABNORMAL HIGH (ref <1.0)

## 2021-09-04 MED ORDER — POTASSIUM CHLORIDE CRYS ER 20 MEQ PO TBCR
40.0000 meq | EXTENDED_RELEASE_TABLET | Freq: Once | ORAL | Status: AC
  Administered 2021-09-04: 40 meq via ORAL
  Filled 2021-09-04: qty 2

## 2021-09-04 MED ORDER — LOPERAMIDE HCL 2 MG PO CAPS
2.0000 mg | ORAL_CAPSULE | Freq: Every day | ORAL | Status: DC | PRN
  Administered 2021-09-04 – 2021-09-05 (×2): 2 mg via ORAL
  Filled 2021-09-04 (×3): qty 1

## 2021-09-04 NOTE — Plan of Care (Signed)

## 2021-09-04 NOTE — Progress Notes (Signed)
Pt's daughter called this RN and explained that pt wants to be a full code. Daughter said that the pt told the doctor he wanted to be a DNR but then changed his mind. Daughter asked me talk to pt to make sure exactly what code status he wants. This RN asked pt if he wants everything done to save his life. Pt responded "yes." This RN notified Broadus John MD.

## 2021-09-04 NOTE — Consult Note (Signed)
   [facility] Inpatient Consult   09/04/2021  YACQUB BASTON 07/27/1951 [id number]  [facility] [[facility]] Patient: [facility]  Reviewed for less than 7 days readmission hospitalization.  Patient is currently active with [facility] for chronic disease management services.  Patient has been engaged by a [place].  Reviewed for readmission noted from progress notes.    Patient will receive a post hospital call and will be evaluated for assessments and disease process education.    Plan:  Continue to follow for disposition.  If patient transitions to a skilled nursing facility as recommended then his post hospital transition of care needs are to be met at a skilled nursing facility level of care.   Of note, [facility] Care Management services does not replace or interfere with any services that are needed or arranged by inpatient [facility] care management team.  For additional questions or referrals please contact:  Natividad Brood, RN BSN [facility] Liaison  [phone] business mobile phone Toll free office [phone]  Fax number: [phone] [email or web address]

## 2021-09-04 NOTE — Progress Notes (Addendum)
PROGRESS NOTE    Shawn [name]  OVF:[id number] DOB: February 10, 1951 DOA: 08/29/2021 PCP: [name], PA-C  Brief Narrative:Shawn [name] is a 70 y.o. male with medical history significant of type 2 diabetes, hypertension, PVD status post right BKA and left AKA, hyperlipidemia.  He was recently admitted to the hospital 9/8 for acute onset right-sided weakness, right facial droop, and confusion.  Found to have left cerebral infarct likely secondary to severe cardiomyopathy EF 25% and left atrial thrombus. Followed by Neurology and cardiology, started on CHF meds, Eliquis and discharged to rehab.  He had a COVID test done on day of DC 9/15 which resulted after his discharge and was positive.  As such, his nursing facility sent him back to the ED as they do not have a COVID unit. -Patient had low-grade fever, intermittent cough, nausea and vomiting with high inflammatory markers, started on IV remdesivir, slowly improving  Assessment & Plan:   COVID-19 positive SARS-CoV-2 PCR test done 9/15 prior to discharge came back positive -Intermittent cough, nausea vomiting and poor p.o. intake for first few days, now improving, no further vomiting, oral intake is better -Chest x-ray 9/14 & 9/16 without pneumonia -No evidence of hypoxia, inflammatory markers remain significantly elevated -Completed 5 days of IV remdesivir yesterday 9/20 -Airborne and contact precautions -Monitor for complications, already on Eliquis for LA thrombus -PT OT, discharge planning, plan to return to SNF after COVID isolation completed  Leukocytosis -Likely reactive secondary to above, CRP remains considerably elevated as well, afebrile, procalcitonin was very high too, leukocytosis improving down to 14<16<20,, repeat chest x-ray is unremarkable, repeat UA was cloudy with no bacteria, 21-50 WBCs, urine culture pending, afebrile, leukocytosis and procalcitonin improving, just completed 5 days of antibiotics for UTI as well, no  urinary retention noted by staff, abdominal exam is benign  -Continue to monitor, overall considerably better from last week  Left cerebral infarct  -likely due to cardiomyopathy, LA thrombus Admitted with Right-sided weakness, confusion, seen by neurology in consultation, we were unable to obtain an MRI due to foreign body, left cerebral infarct was suspected -LDL 142, hemoglobin A1c was 10.4 -2D echo noted EF of 30%, global hypokinesis and grade 2 diastolic dysfunction, TEE noted large left atrial thrombus, followed by cardiology and started on Eliquis, aspirin and Plavix were discontinued after discussion with neurology -Plan was for SNF for rehab just prior to this readmission -FU with [facility] neurology in 4 to 6 weeks.   Acute kidney injury -Labs with worsening creatinine and potassium -Due to GI losses, poor p.o. intake, held Entresto and Aldactone, hydrated with gentle IV fluids  -Creatinine improving, slowly restart Entresto and Aldactone later this week -Oral intake is improving as well  Hypertension Stable. -Continue Coreg, held [place] on account of AKI   Type 2 diabetes Recent A1c 10.4.  His blood glucose was poorly controlled during recent hospitalization and he was started on Semglee insulin and Farxiga. -Continue Semglee and [place] are stable   Large left atrial appendage thrombus -Continue Eliquis   Hyperlipidemia -Continue Lipitor   New cardiomyopathy, systolic and diastolic heart failure Recent TTE showing EF 25-30%, global hypokinesis with apical akinesis, and grade 2 diastolic dysfunction.  TEE showing EF 30-35% and RWMA.  Currently euvolemic on exam. -Continue carvedilol,and Farxiga.  Cardiology planning on outpatient cath. Delene Loll, aldactone held with worsening AKI, hyperkalemia -Resume above meds slowly as blood pressure, kidney function permits   PVD -Status post right BKA and left AKA.   UTI UA  done 9/14 day prior to recent discharge noted  positive nitrite, small amount of leukocytes, and many bacteria.  He was treated with IV ceftriaxone while hospitalized and discharged on oral cefdinir x3 days.   -resumed cefdinir on admission, Urine culture with multiple species, discontinued antibiotics after 5 days   DVT prophylaxis: Eliquis Code Status: DNR, discussed with pt and updated daughter Overton Mam  Family Communication: No family at bedside,  updated daughter Overton Mam Disposition Plan:  Status is: inpatient because: Unsafe d/c plan, severity of illness  Dispo: The patient is from: Home              Anticipated d/c is to: SNF              Patient currently is not medically stable to d/c.   Difficult to place patient No   Consultants:   Procedures:   Antimicrobials:    Subjective: -Feels better today, no further nausea or vomiting, starting to eat better, denies any cough congestion or shortness of breath, denies any diarrhea  Objective: Vitals:   09/03/21 1642 09/03/21 1943 09/04/21 0344 09/04/21 0942  BP: 101/63 (!) 136/114 101/62 107/67  Pulse: 88 74  88  Resp:  20 15   Temp:  98.8 F (37.1 C) 98 F (36.7 C)   TempSrc:  Oral Axillary   SpO2:  97% 100% 96%    Intake/Output Summary (Last 24 hours) at 09/04/2021 1010 Last data filed at 09/04/2021 0347 Gross per 24 hour  Intake 920 ml  Output 450 ml  Net 470 ml   There were no vitals filed for this visit.  Examination:  General exam: Chronically ill male appears older than stated age, more awake alert and interactive today, oriented to self and place CVS: S1-S2, regular rate rhythm Lungs: Improving air movement Abdomen: Soft, nontender, bowel sounds present  Extremities: Right BKA, left AKA Neuro: Mild R leg weakness  Psychiatry: Flat affect    Data Reviewed:   CBC: Recent Labs  Lab 08/30/21 1506 08/31/21 0200 09/01/21 0715 09/02/21 0509 09/03/21 0329 09/04/21 0200  WBC 17.8* 16.9* 19.5* 20.7* 16.6* 14.0*  NEUTROABS 15.1*  --   --   --    --   --   HGB 14.7 14.4 14.3 13.7 13.2 12.1*  HCT 45.0 44.1 42.9 40.3 38.0* 35.1*  MCV 100.7* 98.7 95.1 96.2 93.8 92.9  PLT 302 318 322 302 331 203   Basic Metabolic Panel: Recent Labs  Lab 08/31/21 0200 09/01/21 0715 09/02/21 0509 09/03/21 0329 09/04/21 0200  NA 137 133* 133* 131* 129*  K 5.3* 4.1 4.1 3.5 3.0*  CL 105 103 103 100 100  CO2 10* 15* 14* 19* 18*  GLUCOSE 185* 147* 166* 160* 114*  BUN 26* 24* 26* 22 23  CREATININE 1.55* 1.30* 1.34* 1.22 1.37*  CALCIUM 9.4 8.5* 8.4* 8.5* 8.1*   GFR: Estimated Creatinine Clearance: 44.1 mL/min (A) (by C-G formula based on SCr of 1.37 mg/dL (H)). Liver Function Tests: Recent Labs  Lab 08/31/21 0200 09/01/21 0715 09/02/21 0509  AST 38 21 21  ALT 30 21 21   ALKPHOS 72 66 61  BILITOT 1.7* 1.1 1.1  PROT 7.3 6.9 6.1*  ALBUMIN 2.8* 2.5* 2.2*   No results for input(s): LIPASE, AMYLASE in the last 168 hours. No results for input(s): AMMONIA in the last 168 hours. Coagulation Profile: No results for input(s): INR, PROTIME in the last 168 hours. Cardiac Enzymes: No results for input(s): CKTOTAL, CKMB, CKMBINDEX, TROPONINI in the last  168 hours. BNP (last 3 results) No results for input(s): PROBNP in the last 8760 hours. HbA1C: No results for input(s): HGBA1C in the last 72 hours. CBG: Recent Labs  Lab 09/03/21 0816 09/03/21 1427 09/03/21 1648 09/03/21 2007 09/04/21 0754  GLUCAP 130* 145* 121* 116* 119*   Lipid Profile: No results for input(s): CHOL, HDL, LDLCALC, TRIG, CHOLHDL, LDLDIRECT in the last 72 hours. Thyroid Function Tests: No results for input(s): TSH, T4TOTAL, FREET4, T3FREE, THYROIDAB in the last 72 hours. Anemia Panel: Recent Labs    09/03/21 0329 09/04/21 0200  FERRITIN 494* 658*   Urine analysis:    Component Value Date/Time   COLORURINE YELLOW 09/03/2021 1447   APPEARANCEUR CLOUDY (A) 09/03/2021 1447   LABSPEC 1.010 09/03/2021 1447   PHURINE 6.0 09/03/2021 1447   GLUCOSEU >=500 (A) 09/03/2021  1447   HGBUR SMALL (A) 09/03/2021 1447   BILIRUBINUR SMALL (A) 09/03/2021 1447   KETONESUR 15 (A) 09/03/2021 1447   PROTEINUR NEGATIVE 09/03/2021 1447   NITRITE NEGATIVE 09/03/2021 1447   LEUKOCYTESUR TRACE (A) 09/03/2021 1447   Sepsis Labs: @LABRCNTIP (procalcitonin:4,lacticidven:4)  ) Recent Results (from the past 240 hour(s))  Resp Panel by RT-PCR (Flu A&B, Covid) Nasopharyngeal Swab     Status: Abnormal   Collection Time: 08/29/21  9:55 AM   Specimen: Nasopharyngeal Swab; Nasopharyngeal(NP) swabs in vial transport medium  Result Value Ref Range Status   SARS Coronavirus 2 by RT PCR POSITIVE (A) NEGATIVE Final    Comment: PT DISCHARGED PER RN (NOTE) SARS-CoV-2 target nucleic acids are DETECTED.  The SARS-CoV-2 RNA is generally detectable in upper respiratory specimens during the acute phase of infection. Positive results are indicative of the presence of the identified virus, but do not rule out bacterial infection or co-infection with other pathogens not detected by the test. Clinical correlation with patient history and other diagnostic information is necessary to determine patient infection status. The expected result is Negative.  Fact Sheet for Patients: EntrepreneurPulse.com.au  Fact Sheet for Healthcare Providers: IncredibleEmployment.be  This test is not yet approved or cleared by the [place] FDA and  has been authorized for detection and/or diagnosis of SARS-CoV-2 by FDA under an Emergency Use Authorization (EUA).  This EUA will remain in effect (meaning this test can be used) for the duration of  the COVID-19 declaration under Sect ion 564(b)(1) of the Act, 21 U.S.C. section 360bbb-3(b)(1), unless the authorization is terminated or revoked sooner.     Influenza A by PCR NEGATIVE NEGATIVE Final   Influenza B by PCR NEGATIVE NEGATIVE Final    Comment: (NOTE) The Xpert Xpress SARS-CoV-2/FLU/RSV plus assay is intended as  an aid in the diagnosis of influenza from Nasopharyngeal swab specimens and should not be used as a sole basis for treatment. Nasal washings and aspirates are unacceptable for Xpert Xpress SARS-CoV-2/FLU/RSV testing.  Fact Sheet for Patients: EntrepreneurPulse.com.au  Fact Sheet for Healthcare Providers: IncredibleEmployment.be  This test is not yet approved or cleared by the [place] FDA and has been authorized for detection and/or diagnosis of SARS-CoV-2 by FDA under an Emergency Use Authorization (EUA). This EUA will remain in effect (meaning this test can be used) for the duration of the COVID-19 declaration under Section 564(b)(1) of the Act, 21 U.S.C. section 360bbb-3(b)(1), unless the authorization is terminated or revoked.  Performed at [facility], [place] [address], [place], [place] [address]   Urine Culture     Status: Abnormal   Collection Time: 08/29/21 11:50 AM   Specimen:  Urine, Clean Catch  Result Value Ref Range Status   Specimen Description URINE, CLEAN CATCH  Final   Special Requests   Final    NONE Performed at [facility], [address], [place], [place] [address]    Culture MULTIPLE SPECIES PRESENT, SUGGEST RECOLLECTION (A)  Final   Report Status 08/30/2021 FINAL  Final    Scheduled Meds:  apixaban  5 mg Oral BID   atorvastatin  80 mg Oral Daily   carvedilol  6.25 mg Oral BID WC   dapagliflozin propanediol  10 mg Oral Daily   insulin aspart  0-9 Units Subcutaneous TID WC   insulin glargine-yfgn  6 Units Subcutaneous Daily   potassium chloride  40 mEq Oral Once   vitamin B-12  1,000 mcg Oral Daily   Continuous Infusions:   LOS: 5 days    Time spent: 24min   Domenic Polite, MD [facility]   09/04/2021, 10:10 AM

## 2021-09-05 DIAGNOSIS — I10 Essential (primary) hypertension: Secondary | ICD-10-CM

## 2021-09-05 DIAGNOSIS — I739 Peripheral vascular disease, unspecified: Secondary | ICD-10-CM

## 2021-09-05 DIAGNOSIS — E785 Hyperlipidemia, unspecified: Secondary | ICD-10-CM

## 2021-09-05 LAB — GLUCOSE, CAPILLARY
Glucose-Capillary: 140 mg/dL — ABNORMAL HIGH (ref 70–99)
Glucose-Capillary: 157 mg/dL — ABNORMAL HIGH (ref 70–99)
Glucose-Capillary: 157 mg/dL — ABNORMAL HIGH (ref 70–99)
Glucose-Capillary: 141 mg/dL — ABNORMAL HIGH (ref 70–99)

## 2021-09-05 LAB — BASIC METABOLIC PANEL
Anion gap: 10 (ref 5–15)
BUN: 21 mg/dL (ref 8–23)
CO2: 18 mmol/L — ABNORMAL LOW (ref 22–32)
Calcium: 8.4 mg/dL — ABNORMAL LOW (ref 8.9–10.3)
Chloride: 102 mmol/L (ref 98–111)
Creatinine, Ser: 1.38 mg/dL — ABNORMAL HIGH (ref 0.61–1.24)
GFR, Estimated: 55 mL/min — ABNORMAL LOW (ref >60)
Glucose, Bld: 161 mg/dL — ABNORMAL HIGH (ref 70–99)
Potassium: 3.3 mmol/L — ABNORMAL LOW (ref 3.5–5.1)
Sodium: 130 mmol/L — ABNORMAL LOW (ref 135–145)

## 2021-09-05 LAB — CBC
HCT: 34.6 % — ABNORMAL LOW (ref 39.0–52.0)
Hemoglobin: 11.9 g/dL — ABNORMAL LOW (ref 13.0–17.0)
MCH: 31.9 pg (ref 26.0–34.0)
MCHC: 34.4 g/dL (ref 30.0–36.0)
MCV: 92.8 fL (ref 80.0–100.0)
Platelets: 283 10*3/uL (ref 150–400)
RBC: 3.73 MIL/uL — ABNORMAL LOW (ref 4.22–5.81)
RDW: 14.7 % (ref 11.5–15.5)
WBC: 13.5 10*3/uL — ABNORMAL HIGH (ref 4.0–10.5)
nRBC: 0 % (ref 0.0–0.2)

## 2021-09-05 LAB — C-REACTIVE PROTEIN: CRP: 21.9 mg/dL — ABNORMAL HIGH (ref <1.0)

## 2021-09-05 LAB — FERRITIN: Ferritin: 800 ng/mL — ABNORMAL HIGH (ref 24–336)

## 2021-09-05 NOTE — Progress Notes (Signed)
[place] received phone call from pt.'s RN requesting assistance for pt.'s dtr. [place] in coordinating AD completion for pt.  Upon further discussion RN shared that pt. is COVID+ currently and is not yet Aox4.  [facility] called dtr. to explain these barriers to getting AD notarized and completed.  Chaplains remain available and glad to assist with AD if pt. gains sufficient orientation to work on the document and if he still needs assistance with notarization after coming off COVID isolation precautions.  Lindaann Pascal, Chaplain Pager: [phone]

## 2021-09-05 NOTE — Progress Notes (Signed)
PROGRESS NOTE    Shawn [name]  EQA:[id number] DOB: 1951/08/05 DOA: 08/29/2021 PCP: [name], PA-C    Brief Narrative:  Mr. Kritikos was admitted to the hospital with a working diagnosis of symptomatic SARS COVID-19 infection.  70 year old male with a past medical history for type II Titus mellitus, hypertension, peripheral vascular disease status post right BKA left AKA, dyslipidemia who presented from the skilled nursing facility after testing positive for COVID-19.  He was recently hospitalized for left cerebellar infarct related to left atrial thrombus.  A screening COVID test was done before discharge, it resulted positive and he was transported back to the hospital. On his initial physical examination blood pressure 108/75, heart rate 96, respiratory rate 18, temperature 98.1, oxygen saturation 96%.  His lungs are clear to auscultation bilaterally, heart S1-S2, present, rhythmic, soft abdomen, no lower extremity edema.  Patient was oriented to person and place.  Patient had low grade fever, intermittent cough, nausea and vomiting, high inflammatory markers. Patient was placed on remdesivir for antiviral therapy.  Assessment & Plan:   Principal Problem:   COVID-19 Active Problems:   PAD (peripheral artery disease) (HCC)   Essential hypertension   CVA (cerebral vascular accident) ([place])   HLD (hyperlipidemia)   SARS COVID-19 infection. Patient has completed remdesivir therapy.  No dyspnea or chest pain.   2.  Left cerebellar infarct.  Cardiomyopathy with left atrial thrombus./Chronic systolic heart failure. Continue anticoagulation with apixaban.  Heart failure management with carvedilol and dapagliflozin  No clinical signs of exacerbation  3.  Acute kidney injury stable renal function, avoid hypotension and nephrotoxic medications.   4.  Hypertension continue blood pressure control with carvedilol with good toleration  5.  Type 2 diabetes mellitus/dyslipidemia. His  glucose has been stable, patient is tolerating po well, Fasting glucose is 161 today, and capillary 157, 141. Will discontinue sliding scale, continue with basal insulin 6 units and check capillary glucose as needed bid.   Continue with atorvastatin  6.  Peripheral vascular disease status post right BKA and left AKA. Continue with PT and OT  7.  Urinary tract infection, present on admission. Completed antibiotic therapy   Status is: Inpatient  Remains inpatient appropriate because:Inpatient level of care appropriate due to severity of illness  Dispo: The patient is from: SNF              Anticipated d/c is to: SNF              Patient currently is medically stable to d/c.   Difficult to place patient No   DVT prophylaxis: Enoxaparin   Code Status:    full  Family Communication:  No family at the bedside      Subjective: Patient with no nausea or vomiting, no dyspnea or chest pain, continue to be very weak and deconditioned   Objective: Vitals:   09/04/21 2011 09/05/21 0500 09/05/21 0800 09/05/21 1205  BP: 98/69 95/61 97/62  (!) 101/58  Pulse: 85 87 84 85  Resp:      Temp: 97.9 F (36.6 C) 98.1 F (36.7 C) 97.8 F (36.6 C) 99 F (37.2 C)  TempSrc: Oral Oral Oral Oral  SpO2: 99% 98% 97%     Intake/Output Summary (Last 24 hours) at 09/05/2021 1624 Last data filed at 09/05/2021 0830 Gross per 24 hour  Intake 480 ml  Output 650 ml  Net -170 ml   There were no vitals filed for this visit.  Examination:   General: Not in pain  or dyspnea  Neurology: Awake and alert, non focal  E ENT: no pallor, no icterus, oral mucosa moist Cardiovascular: No JVD. S1-S2 present, rhythmic, no gallops, rubs, or murmurs. No lower extremity edema. Pulmonary: positive breath sounds bilaterally, adequate air movement, no wheezing, rhonchi or rales. Gastrointestinal. Abdomen soft and non tender Skin. Right BKA and left AKA Musculoskeletal: no joint deformities     Data Reviewed: I  have personally reviewed following labs and imaging studies  CBC: Recent Labs  Lab 08/30/21 1506 08/31/21 0200 09/01/21 0715 09/02/21 0509 09/03/21 0329 09/04/21 0200 09/05/21 0245  WBC 17.8*   < > 19.5* 20.7* 16.6* 14.0* 13.5*  NEUTROABS 15.1*  --   --   --   --   --   --   HGB 14.7   < > 14.3 13.7 13.2 12.1* 11.9*  HCT 45.0   < > 42.9 40.3 38.0* 35.1* 34.6*  MCV 100.7*   < > 95.1 96.2 93.8 92.9 92.8  PLT 302   < > 322 302 331 308 283   < > = values in this interval not displayed.   Basic Metabolic Panel: Recent Labs  Lab 09/01/21 0715 09/02/21 0509 09/03/21 0329 09/04/21 0200 09/05/21 0245  NA 133* 133* 131* 129* 130*  K 4.1 4.1 3.5 3.0* 3.3*  CL 103 103 100 100 102  CO2 15* 14* 19* 18* 18*  GLUCOSE 147* 166* 160* 114* 161*  BUN 24* 26* 22 23 21   CREATININE 1.30* 1.34* 1.22 1.37* 1.38*  CALCIUM 8.5* 8.4* 8.5* 8.1* 8.4*   GFR: Estimated Creatinine Clearance: 43.7 mL/min (A) (by C-G formula based on SCr of 1.38 mg/dL (H)). Liver Function Tests: Recent Labs  Lab 08/31/21 0200 09/01/21 0715 09/02/21 0509  AST 38 21 21  ALT 30 21 21   ALKPHOS 72 66 61  BILITOT 1.7* 1.1 1.1  PROT 7.3 6.9 6.1*  ALBUMIN 2.8* 2.5* 2.2*   No results for input(s): LIPASE, AMYLASE in the last 168 hours. No results for input(s): AMMONIA in the last 168 hours. Coagulation Profile: No results for input(s): INR, PROTIME in the last 168 hours. Cardiac Enzymes: No results for input(s): CKTOTAL, CKMB, CKMBINDEX, TROPONINI in the last 168 hours. BNP (last 3 results) No results for input(s): PROBNP in the last 8760 hours. HbA1C: No results for input(s): HGBA1C in the last 72 hours. CBG: Recent Labs  Lab 09/04/21 1601 09/04/21 2009 09/05/21 0736 09/05/21 0841 09/05/21 1208  GLUCAP 129* 147* 140* 157* 157*   Lipid Profile: No results for input(s): CHOL, HDL, LDLCALC, TRIG, CHOLHDL, LDLDIRECT in the last 72 hours. Thyroid Function Tests: No results for input(s): TSH, T4TOTAL,  FREET4, T3FREE, THYROIDAB in the last 72 hours. Anemia Panel: Recent Labs    09/04/21 0200 09/05/21 0245  FERRITIN 658* 800*      Radiology Studies: I have reviewed all of the imaging during this hospital visit personally     Scheduled Meds:  apixaban  5 mg Oral BID   atorvastatin  80 mg Oral Daily   carvedilol  6.25 mg Oral BID WC   dapagliflozin propanediol  10 mg Oral Daily   insulin aspart  0-9 Units Subcutaneous TID WC   insulin glargine-yfgn  6 Units Subcutaneous Daily   vitamin B-12  1,000 mcg Oral Daily   Continuous Infusions:   LOS: 6 days        Cephas Revard Gerome Apley, MD

## 2021-09-05 NOTE — TOC Progression Note (Signed)
Transition of Care [facility]) - Progression Note    Patient Details  Name: Shawn [name] MRN: [id number] Date of Birth: 02-06-1951  [facility]) CM/SW Contact  Joanne Chars, LCSW Phone Number: 09/05/2021, 11:01 AM  Clinical Narrative:   CSW received call back from daughter [place].  She asked about medicaid and also for help with healthcare POA.  Email sent to [place] with financial counseling.  CSW also spoke with Gerald Stabs in [place] office who will contact daughter.  Issue will be pt needs to be out of isolation if they are going to have witnesses for the POA to be signed and notarized.      Expected Discharge Plan: Skilled Nursing Facility Barriers to Discharge: Other (must enter comment) (completion of covid quarantine)  Expected Discharge Plan and Services Expected Discharge Plan: [place] Choice: [place] arrangements for the past 2 months: Single Family Home                                       Social Determinants of Health (SDOH) Interventions    Readmission Risk Interventions No flowsheet data found.

## 2021-09-06 LAB — GLUCOSE, CAPILLARY
Glucose-Capillary: 97 mg/dL (ref 70–99)
Glucose-Capillary: 150 mg/dL — ABNORMAL HIGH (ref 70–99)

## 2021-09-06 LAB — URINE CULTURE
Culture: 60000 — AB
Special Requests: NORMAL

## 2021-09-06 MED ORDER — OXYCODONE-ACETAMINOPHEN 5-325 MG PO TABS
1.0000 | ORAL_TABLET | ORAL | Status: DC | PRN
  Administered 2021-09-06 – 2021-09-09 (×2): 1 via ORAL
  Filled 2021-09-06 (×3): qty 1

## 2021-09-06 MED ORDER — BISACODYL 5 MG PO TBEC
5.0000 mg | DELAYED_RELEASE_TABLET | Freq: Once | ORAL | Status: AC
  Administered 2021-09-06: 5 mg via ORAL
  Filled 2021-09-06: qty 1
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
## 2021-09-06 NOTE — Progress Notes (Signed)
PROGRESS NOTE    Shawn [name]  DUK:[id number] DOB: [birth year] DOA: 08/29/2021 PCP: [name], PA-C    Brief Narrative:  Shawn [name] was admitted to the hospital with a working diagnosis of symptomatic SARS COVID-19 infection.   70 year old male with a past medical history for type II Titus mellitus, hypertension, peripheral vascular disease status post right BKA left AKA, dyslipidemia who presented from the skilled nursing facility after testing positive for COVID-19.  He was recently hospitalized for left cerebellar infarct related to left atrial thrombus.  A screening COVID test was done before discharge, it resulted positive and he was transported back to the hospital. On his initial physical examination blood pressure 108/75, heart rate 96, respiratory rate 18, temperature 98.1, oxygen saturation 96%.  His lungs are clear to auscultation bilaterally, heart S1-S2, present, rhythmic, soft abdomen, no lower extremity edema.  Patient was oriented to person and place.   Patient had low grade fever, intermittent cough, nausea and vomiting, high inflammatory markers. Patient was placed on remdesivir for antiviral therapy  Pending to complete quarantine 10 days, before patient can be transferred back to SNF.   Assessment & Plan:   Principal Problem:   COVID-19 Active Problems:   PAD (peripheral artery disease) (HCC)   Essential hypertension   CVA (cerebral vascular accident) ([place])   HLD (hyperlipidemia)   SARS COVID-19 infection. Patient has completed remdesivir therapy.  No dyspnea or chest pain.   Plan to complete quarantine/ airborne precautions on 09/08/21. Patient has no further viral symptoms.    2.  Left cerebellar infarct.  Cardiomyopathy with left atrial thrombus./Chronic systolic heart failure. Anticoagulation with apixaban.  Continue with carvedilol and dapagliflozin for heart failure   No clinical signs of acute exacerbation   3.  Acute kidney injury/ hyponatremia  and hypokalemia His renal function has been stable.   Check renal function in am.   4.  Hypertension  Blood pressure control with carvedilol.   5.  Type 2 diabetes mellitus/dyslipidemia. Patient with poor oral intake, capillary glucose 157, 141 and 97. Plan to hold on basal insulin for now.  Continue capillary glucose checks as needed bid.    On atorvastatin   6.  Peripheral vascular disease status post right BKA and left AKA.  PT and OT Plan to return to SNF  Today with constipation, add dulcolax for bowel movement, pain control with oral hydrocodone/ acetaminophen    7.  Urinary tract infection, present on admission. Completed antibiotic therapy   Status is: Inpatient  Remains inpatient appropriate because:Inpatient level of care appropriate due to severity of illness  Dispo: The patient is from: SNF              Anticipated d/c is to: SNF              Patient currently is medically stable to d/c.   Difficult to place patient No   DVT prophylaxis: Enoxaparin   Code Status:    full  Family Communication:  No family at the bedside      Subjective: Patient continue to be very weak and deconditioned, poor oral intake, no nausea or vomiting, no dyspnea or chest pain.  Positive abdominal pain and distention today, very small bowel movement, while doing pT.  Objective: Vitals:   09/05/21 1700 09/05/21 2041 09/06/21 0441 09/06/21 1100  BP: 102/61 94/62 108/68 112/69  Pulse: 83 79 80 84  Resp:  15 16   Temp: 98.3 F (36.8 C) 98.3 F (36.8 C)  98 F (36.7 C)   TempSrc: Oral Axillary Axillary   SpO2: 97% 99% 99% 98%    Intake/Output Summary (Last 24 hours) at 09/06/2021 1615 Last data filed at 09/06/2021 1100 Gross per 24 hour  Intake 500 ml  Output 650 ml  Net -150 ml   There were no vitals filed for this visit.  Examination:   General: Not in pain or dyspnea, deconditioned  Neurology: Awake,  E ENT: no pallor, no icterus, oral mucosa moist Cardiovascular:  No JVD. S1-S2 present, rhythmic, no gallops, rubs, or murmurs. No lower extremity edema. Pulmonary:  positive breath sounds bilaterally, adequate air movement, no wheezing, rhonchi or rales. Gastrointestinal. Abdomen soft but distended. Not fran tender Skin. No rashes Musculoskeletal: right BKA and left AKA      Data Reviewed: I have personally reviewed following labs and imaging studies  CBC: Recent Labs  Lab 09/01/21 0715 09/02/21 0509 09/03/21 0329 09/04/21 0200 09/05/21 0245  WBC 19.5* 20.7* 16.6* 14.0* 13.5*  HGB 14.3 13.7 13.2 12.1* 11.9*  HCT 42.9 40.3 38.0* 35.1* 34.6*  MCV 95.1 96.2 93.8 92.9 92.8  PLT 322 302 331 308 240   Basic Metabolic Panel: Recent Labs  Lab 09/01/21 0715 09/02/21 0509 09/03/21 0329 09/04/21 0200 09/05/21 0245  NA 133* 133* 131* 129* 130*  K 4.1 4.1 3.5 3.0* 3.3*  CL 103 103 100 100 102  CO2 15* 14* 19* 18* 18*  GLUCOSE 147* 166* 160* 114* 161*  BUN 24* 26* 22 23 21   CREATININE 1.30* 1.34* 1.22 1.37* 1.38*  CALCIUM 8.5* 8.4* 8.5* 8.1* 8.4*   GFR: Estimated Creatinine Clearance: 43.7 mL/min (A) (by C-G formula based on SCr of 1.38 mg/dL (H)). Liver Function Tests: Recent Labs  Lab 08/31/21 0200 09/01/21 0715 09/02/21 0509  AST 38 21 21  ALT 30 21 21   ALKPHOS 72 66 61  BILITOT 1.7* 1.1 1.1  PROT 7.3 6.9 6.1*  ALBUMIN 2.8* 2.5* 2.2*   No results for input(s): LIPASE, AMYLASE in the last 168 hours. No results for input(s): AMMONIA in the last 168 hours. Coagulation Profile: No results for input(s): INR, PROTIME in the last 168 hours. Cardiac Enzymes: No results for input(s): CKTOTAL, CKMB, CKMBINDEX, TROPONINI in the last 168 hours. BNP (last 3 results) No results for input(s): PROBNP in the last 8760 hours. HbA1C: No results for input(s): HGBA1C in the last 72 hours. CBG: Recent Labs  Lab 09/05/21 0736 09/05/21 0841 09/05/21 1208 09/05/21 1635 09/06/21 1102  GLUCAP 140* 157* 157* 141* 97   Lipid Profile: No  results for input(s): CHOL, HDL, LDLCALC, TRIG, CHOLHDL, LDLDIRECT in the last 72 hours. Thyroid Function Tests: No results for input(s): TSH, T4TOTAL, FREET4, T3FREE, THYROIDAB in the last 72 hours. Anemia Panel: Recent Labs    09/04/21 0200 09/05/21 0245  FERRITIN 658* 800*      Radiology Studies: I have reviewed all of the imaging during this hospital visit personally     Scheduled Meds:  apixaban  5 mg Oral BID   atorvastatin  80 mg Oral Daily   carvedilol  6.25 mg Oral BID WC   dapagliflozin propanediol  10 mg Oral Daily   vitamin B-12  1,000 mcg Oral Daily   Continuous Infusions:   LOS: 7 days        Denesia Donelan Gerome Apley, MD
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
## 2021-09-07 DIAGNOSIS — I63442 Cerebral infarction due to embolism of left cerebellar artery: Secondary | ICD-10-CM

## 2021-09-07 LAB — BASIC METABOLIC PANEL
Anion gap: 12 (ref 5–15)
BUN: 20 mg/dL (ref 8–23)
CO2: 18 mmol/L — ABNORMAL LOW (ref 22–32)
Calcium: 8.4 mg/dL — ABNORMAL LOW (ref 8.9–10.3)
Chloride: 102 mmol/L (ref 98–111)
Creatinine, Ser: 1.53 mg/dL — ABNORMAL HIGH (ref 0.61–1.24)
GFR, Estimated: 49 mL/min — ABNORMAL LOW (ref >60)
Glucose, Bld: 195 mg/dL — ABNORMAL HIGH (ref 70–99)
Potassium: 3.2 mmol/L — ABNORMAL LOW (ref 3.5–5.1)
Sodium: 132 mmol/L — ABNORMAL LOW (ref 135–145)

## 2021-09-07 MED ORDER — PANTOPRAZOLE SODIUM 40 MG PO TBEC
40.0000 mg | DELAYED_RELEASE_TABLET | Freq: Two times a day (BID) | ORAL | Status: DC
  Administered 2021-09-07 – 2021-09-10 (×6): 40 mg via ORAL
  Filled 2021-09-07 (×7): qty 1

## 2021-09-07 MED ORDER — SODIUM CHLORIDE 0.9 % IV BOLUS
250.0000 mL | Freq: Once | INTRAVENOUS | Status: AC
  Administered 2021-09-07: 250 mL via INTRAVENOUS

## 2021-09-07 MED ORDER — SODIUM CHLORIDE 0.9 % IV SOLN: INTRAVENOUS | Status: DC

## 2021-09-07 MED ORDER — POLYETHYLENE GLYCOL 3350 17 G PO PACK
17.0000 g | PACK | Freq: Every day | ORAL | Status: DC
  Filled 2021-09-07: qty 1

## 2021-09-07 MED ORDER — POTASSIUM CHLORIDE CRYS ER 20 MEQ PO TBCR
40.0000 meq | EXTENDED_RELEASE_TABLET | Freq: Once | ORAL | Status: AC
  Administered 2021-09-07: 40 meq via ORAL
  Filled 2021-09-07: qty 2

## 2021-09-07 NOTE — Plan of Care (Signed)
  Problem: Activity: Goal: Risk for activity intolerance will decrease Outcome: Progressing   Problem: Nutrition: Goal: Adequate nutrition will be maintained Outcome: Progressing   Problem: Coping: Goal: Level of anxiety will decrease Outcome: Progressing   Problem: Elimination: Goal: Will not experience complications related to bowel motility Outcome: Progressing   

## 2021-09-07 NOTE — Progress Notes (Signed)
PROGRESS NOTE    Shawn [name]  PJA:[id number] DOB: October 19, 1951 DOA: 08/29/2021 PCP: [name], PA-C    Brief Narrative:  Mr. Hoshino was admitted to the hospital with a working diagnosis of symptomatic SARS COVID-19 infection.   70 year old male with a past medical history for type II Titus mellitus, hypertension, peripheral vascular disease status post right BKA left AKA, dyslipidemia who presented from the skilled nursing facility after testing positive for COVID-19.  He was recently hospitalized for left cerebellar infarct related to left atrial thrombus.  A screening COVID test was done before discharge, it resulted positive and he was transported back to the hospital. On his initial physical examination blood pressure 108/75, heart rate 96, respiratory rate 18, temperature 98.1, oxygen saturation 96%.  His lungs are clear to auscultation bilaterally, heart S1-S2, present, rhythmic, soft abdomen, no lower extremity edema.  Patient was oriented to person and place.   Patient had low grade fever, intermittent cough, nausea and vomiting, high inflammatory markers. Patient was placed on remdesivir for antiviral therapy   Pending to complete quarantine 10 days, before patient can be transferred back to SNF.    Assessment & Plan:   Principal Problem:   COVID-19 Active Problems:   PAD (peripheral artery disease) (HCC)   Essential hypertension   CVA (cerebral vascular accident) ([place])   HLD (hyperlipidemia)   SARS COVID-19 infection. Patient has completed remdesivir therapy.  No dyspnea or chest pain.    Will complete quarantine/ airborne precautions on 09/08/21. Patient has no further viral symptoms.    2.  Left cerebellar infarct.  Cardiomyopathy with left atrial thrombus./Chronic systolic heart failure LV Ef 30 to 35% Tolerating well anticoagulation with apixaban.  Hold on carvedilol and dapagliflozin because hypotension.   No clinical signs of acute exacerbation.   Patient  with epigastric abdominal pain, will do EKG. He has limited communication due to CVA.    3.  Acute kidney injury/ hyponatremia and hypokalemia/ non anion gap metabolic acidosis  Patient with worsening renal function, serum cr 1,53, and K at 3,2 and serum bicarbonate at 18.   Patient with poor oral intake and episodic hypotension. Had bolus NS 250 ml this am, and will plan to continue with 75 ml per HR.  Add 40 meq Kcl po today.    4.  Hypertension, hypotension.  Discontinue carvedilol. Blood pressure has been low, added IV fluids today.    5.  Type 2 diabetes mellitus/dyslipidemia. Continue holding basal insulin to prevent hypoglycemia. Fasting glucose this am 195  On atorvastatin   6.  Peripheral vascular disease status post right BKA and left AKA.  PT and OT Plan to return to SNF   7.  Urinary tract infection, present on admission. Completed antibiotic therapy   8. NEW abdominal pain. Epigastric, will add pantoprazole and will do EKG.  Continue bowel regimen.   Patient continue to be at high risk for worsening abdominal pain.   Status is: Inpatient  Remains inpatient appropriate because:Inpatient level of care appropriate due to severity of illness  Dispo: The patient is from: SNF              Anticipated d/c is to: SNF              Patient currently is not medically stable to d/c.   Difficult to place patient No   DVT prophylaxis: Enoxaparin   Code Status:    full  Family Communication:   No family at the bedside  Subjective: Patient with abdominal pain, epigastric, improved with bowel movement, no nausea or vomiting. His blood pressure has been low.   Objective: Vitals:   09/07/21 0513 09/07/21 0945 09/07/21 1120 09/07/21 1430  BP: 97/71 (!) 86/56 107/66 97/65  Pulse: 97 93 94   Resp: 20 18 17    Temp: 98.1 F (36.7 C)  98.2 F (36.8 C)   TempSrc: Oral  Oral   SpO2: 97% 94% 97%     Intake/Output Summary (Last 24 hours) at 09/07/2021 1459 Last data  filed at 09/07/2021 1120 Gross per 24 hour  Intake 120 ml  Output 255 ml  Net -135 ml   There were no vitals filed for this visit.  Examination:   General: Not in pain or dyspnea, deconditioned  Neurology: Awake and alert, non focal  E ENT: no pallor, no icterus, oral mucosa moist Cardiovascular: No JVD. S1-S2 present, rhythmic, no gallops, rubs, or murmurs. . Pulmonary: positive breath sounds bilaterally, adequate air movement, no wheezing, rhonchi or rales. Gastrointestinal. Abdomen mild distended, tender to deep palpation at the epigastrium Skin. No rashes Musculoskeletal: right BKA and left AKA    Data Reviewed: I have personally reviewed following labs and imaging studies  CBC: Recent Labs  Lab 09/01/21 0715 09/02/21 0509 09/03/21 0329 09/04/21 0200 09/05/21 0245  WBC 19.5* 20.7* 16.6* 14.0* 13.5*  HGB 14.3 13.7 13.2 12.1* 11.9*  HCT 42.9 40.3 38.0* 35.1* 34.6*  MCV 95.1 96.2 93.8 92.9 92.8  PLT 322 302 331 308 235   Basic Metabolic Panel: Recent Labs  Lab 09/02/21 0509 09/03/21 0329 09/04/21 0200 09/05/21 0245 09/07/21 0313  NA 133* 131* 129* 130* 132*  K 4.1 3.5 3.0* 3.3* 3.2*  CL 103 100 100 102 102  CO2 14* 19* 18* 18* 18*  GLUCOSE 166* 160* 114* 161* 195*  BUN 26* 22 23 21 20   CREATININE 1.34* 1.22 1.37* 1.38* 1.53*  CALCIUM 8.4* 8.5* 8.1* 8.4* 8.4*   GFR: Estimated Creatinine Clearance: 39.4 mL/min (A) (by C-G formula based on SCr of 1.53 mg/dL (H)). Liver Function Tests: Recent Labs  Lab 09/01/21 0715 09/02/21 0509  AST 21 21  ALT 21 21  ALKPHOS 66 61  BILITOT 1.1 1.1  PROT 6.9 6.1*  ALBUMIN 2.5* 2.2*   No results for input(s): LIPASE, AMYLASE in the last 168 hours. No results for input(s): AMMONIA in the last 168 hours. Coagulation Profile: No results for input(s): INR, PROTIME in the last 168 hours. Cardiac Enzymes: No results for input(s): CKTOTAL, CKMB, CKMBINDEX, TROPONINI in the last 168 hours. BNP (last 3 results) No results  for input(s): PROBNP in the last 8760 hours. HbA1C: No results for input(s): HGBA1C in the last 72 hours. CBG: Recent Labs  Lab 09/05/21 0841 09/05/21 1208 09/05/21 1635 09/06/21 1102 09/06/21 2029  GLUCAP 157* 157* 141* 97 150*   Lipid Profile: No results for input(s): CHOL, HDL, LDLCALC, TRIG, CHOLHDL, LDLDIRECT in the last 72 hours. Thyroid Function Tests: No results for input(s): TSH, T4TOTAL, FREET4, T3FREE, THYROIDAB in the last 72 hours. Anemia Panel: Recent Labs    09/05/21 0245  FERRITIN 800*      Radiology Studies: I have reviewed all of the imaging during this hospital visit personally     Scheduled Meds:  apixaban  5 mg Oral BID   atorvastatin  80 mg Oral Daily   carvedilol  6.25 mg Oral BID WC   dapagliflozin propanediol  10 mg Oral Daily   vitamin B-12  1,000 mcg  Oral Daily   Continuous Infusions:  sodium chloride 75 mL/hr at 09/07/21 1427     LOS: 8 days        Nini Cavan Gerome Apley, MD

## 2021-09-07 NOTE — Plan of Care (Signed)

## 2021-09-08 LAB — BASIC METABOLIC PANEL
Anion gap: 11 (ref 5–15)
BUN: 21 mg/dL (ref 8–23)
CO2: 19 mmol/L — ABNORMAL LOW (ref 22–32)
Calcium: 8.2 mg/dL — ABNORMAL LOW (ref 8.9–10.3)
Chloride: 102 mmol/L (ref 98–111)
Creatinine, Ser: 1.53 mg/dL — ABNORMAL HIGH (ref 0.61–1.24)
GFR, Estimated: 49 mL/min — ABNORMAL LOW (ref >60)
Glucose, Bld: 245 mg/dL — ABNORMAL HIGH (ref 70–99)
Potassium: 3.5 mmol/L (ref 3.5–5.1)
Sodium: 132 mmol/L — ABNORMAL LOW (ref 135–145)

## 2021-09-08 NOTE — Progress Notes (Signed)
PROGRESS NOTE    Shawn [name]  STM:[id number] DOB: [birth year] DOA: 08/29/2021 PCP: [name], PA-C    Brief Narrative:  Mr. Bonet was admitted to the hospital with a working diagnosis of symptomatic SARS COVID-19 infection.   70 year old male with a past medical history for type II Titus mellitus, hypertension, peripheral vascular disease status post right BKA left AKA, dyslipidemia who presented from the skilled nursing facility after testing positive for COVID-19.  He was recently hospitalized for left cerebellar infarct related to left atrial thrombus.  A screening COVID test was done before discharge, it resulted positive and he was transported back to the hospital. On his initial physical examination blood pressure 108/75, heart rate 96, respiratory rate 18, temperature 98.1, oxygen saturation 96%.  His lungs are clear to auscultation bilaterally, heart S1-S2, present, rhythmic, soft abdomen, no lower extremity edema.  Patient was oriented to person and place.   Patient had low grade fever, intermittent cough, nausea and vomiting, high inflammatory markers. Patient was placed on remdesivir for antiviral therapy   Pending to complete quarantine 10 days, before patient can be transferred back to SNF.      Assessment & Plan:   Principal Problem:   COVID-19 Active Problems:   PAD (peripheral artery disease) (HCC)   Essential hypertension   CVA (cerebral vascular accident) ([place])   HLD (hyperlipidemia)   SARS COVID-19 infection. Patient has completed remdesivir therapy.  No dyspnea or chest pain.   Patient now off isolation, has been 10 days from his positive test and he has no viral symptoms.    2.  Left cerebellar infarct.  Cardiomyopathy with left atrial thrombus./Chronic systolic heart failure LV Ef 30 to 35% Continue with apixaban for anticoagulation.  No clinical signs of acute heart failure exacerbation.    His blood pressure has improved, continue to hold on  antihypertensive medications. Continue gentle IV fluids to prevent hypotension.  OK to discontinue telemetry.   3.  Acute kidney injury/ hyponatremia and hypokalemia/ non anion gap metabolic acidosis  Serum cr continue to be elevated at 1,53, K is 3,5 and serum Na is 132.  Continue with volume repletion with isotonic saline and follow up renal function and electrolytes in am.   4.  Hypertension, hypotension.  Continue to hold antihypertensive medications and continue with IV fluids.    5.  Type 2 diabetes mellitus/dyslipidemia. Fasting glucose today up to 245 Continue to hold for now on basal insulin and follow up glucose in am.    6.  Peripheral vascular disease status post right BKA and left AKA.  Continue with PT and OT Plan to return to SNF   Status is: Inpatient  Remains inpatient appropriate because:Inpatient level of care appropriate due to severity of illness  Dispo: The patient is from: SNF              Anticipated d/c is to: SNF now patient off isolation, can return to SNF for further physical therapy.               Patient currently is medically stable to d/c.   Difficult to place patient No   DVT prophylaxis: Apixban   Code Status:    full  Family Communication:  No family at the bedside      Subjective: Patient feeling better, abdominal pain has improved, no nausea or vomiting, no dyspnea or chest pain, improved po intake.   Objective: Vitals:   09/07/21 1430 09/07/21 1954 09/07/21 2147 09/08/21 8921  BP: 97/65 103/66 99/71 102/72  Pulse:  92 91 90  Resp:   16 18  Temp:    98.8 F (37.1 C)  TempSrc:    Axillary  SpO2:   98% 97%    Intake/Output Summary (Last 24 hours) at 09/08/2021 1219 Last data filed at 09/08/2021 5625 Gross per 24 hour  Intake 1140 ml  Output 1100 ml  Net 40 ml   There were no vitals filed for this visit.  Examination:   General: Not in pain or dyspnea, deconditioned  Neurology: Awake and alert, non focal  E ENT: mild  pallor, no icterus, oral mucosa moist Cardiovascular: No JVD. S1-S2 present, rhythmic, no gallops, rubs, or murmurs. No lower extremity edema. Pulmonary: positive breath sounds bilaterally, adequate air movement, no wheezing, rhonchi or rales. Gastrointestinal. Abdomen mild distended but not tender. Skin. No rashes Musculoskeletal: right BKA and left AKA     Data Reviewed: I have personally reviewed following labs and imaging studies  CBC: Recent Labs  Lab 09/02/21 0509 09/03/21 0329 09/04/21 0200 09/05/21 0245  WBC 20.7* 16.6* 14.0* 13.5*  HGB 13.7 13.2 12.1* 11.9*  HCT 40.3 38.0* 35.1* 34.6*  MCV 96.2 93.8 92.9 92.8  PLT 302 331 308 638   Basic Metabolic Panel: Recent Labs  Lab 09/03/21 0329 09/04/21 0200 09/05/21 0245 09/07/21 0313 09/08/21 0315  NA 131* 129* 130* 132* 132*  K 3.5 3.0* 3.3* 3.2* 3.5  CL 100 100 102 102 102  CO2 19* 18* 18* 18* 19*  GLUCOSE 160* 114* 161* 195* 245*  BUN 22 23 21 20 21   CREATININE 1.22 1.37* 1.38* 1.53* 1.53*  CALCIUM 8.5* 8.1* 8.4* 8.4* 8.2*   GFR: Estimated Creatinine Clearance: 39.4 mL/min (A) (by C-G formula based on SCr of 1.53 mg/dL (H)). Liver Function Tests: Recent Labs  Lab 09/02/21 0509  AST 21  ALT 21  ALKPHOS 61  BILITOT 1.1  PROT 6.1*  ALBUMIN 2.2*   No results for input(s): LIPASE, AMYLASE in the last 168 hours. No results for input(s): AMMONIA in the last 168 hours. Coagulation Profile: No results for input(s): INR, PROTIME in the last 168 hours. Cardiac Enzymes: No results for input(s): CKTOTAL, CKMB, CKMBINDEX, TROPONINI in the last 168 hours. BNP (last 3 results) No results for input(s): PROBNP in the last 8760 hours. HbA1C: No results for input(s): HGBA1C in the last 72 hours. CBG: Recent Labs  Lab 09/05/21 0841 09/05/21 1208 09/05/21 1635 09/06/21 1102 09/06/21 2029  GLUCAP 157* 157* 141* 97 150*   Lipid Profile: No results for input(s): CHOL, HDL, LDLCALC, TRIG, CHOLHDL, LDLDIRECT in  the last 72 hours. Thyroid Function Tests: No results for input(s): TSH, T4TOTAL, FREET4, T3FREE, THYROIDAB in the last 72 hours. Anemia Panel: No results for input(s): VITAMINB12, FOLATE, FERRITIN, TIBC, IRON, RETICCTPCT in the last 72 hours.    Radiology Studies: I have reviewed all of the imaging during this hospital visit personally     Scheduled Meds:  apixaban  5 mg Oral BID   atorvastatin  80 mg Oral Daily   pantoprazole  40 mg Oral BID   polyethylene glycol  17 g Oral Daily   vitamin B-12  1,000 mcg Oral Daily   Continuous Infusions:  sodium chloride 75 mL/hr at 09/08/21 0245     LOS: 9 days        Azaya Goedde Gerome Apley, MD

## 2021-09-08 NOTE — Progress Notes (Signed)
Per MD orders okay to dc airborne & contact isolations.

## 2021-09-09 LAB — BASIC METABOLIC PANEL
Anion gap: 11 (ref 5–15)
BUN: 21 mg/dL (ref 8–23)
CO2: 16 mmol/L — ABNORMAL LOW (ref 22–32)
Calcium: 7.8 mg/dL — ABNORMAL LOW (ref 8.9–10.3)
Chloride: 104 mmol/L (ref 98–111)
Creatinine, Ser: 1.32 mg/dL — ABNORMAL HIGH (ref 0.61–1.24)
GFR, Estimated: 58 mL/min — ABNORMAL LOW (ref >60)
Glucose, Bld: 156 mg/dL — ABNORMAL HIGH (ref 70–99)
Potassium: 3.2 mmol/L — ABNORMAL LOW (ref 3.5–5.1)
Sodium: 131 mmol/L — ABNORMAL LOW (ref 135–145)

## 2021-09-09 LAB — GLUCOSE, CAPILLARY
Glucose-Capillary: 140 mg/dL — ABNORMAL HIGH (ref 70–99)
Glucose-Capillary: 214 mg/dL — ABNORMAL HIGH (ref 70–99)

## 2021-09-09 MED ORDER — PANTOPRAZOLE SODIUM 40 MG PO TBEC: 40.0000 mg | DELAYED_RELEASE_TABLET | Freq: Every day | ORAL | 0 refills | Status: AC

## 2021-09-09 MED ORDER — POLYETHYLENE GLYCOL 3350 17 G PO PACK: 17.0000 g | PACK | Freq: Every day | ORAL | 0 refills | Status: AC

## 2021-09-09 MED ORDER — POTASSIUM CHLORIDE CRYS ER 20 MEQ PO TBCR
40.0000 meq | EXTENDED_RELEASE_TABLET | ORAL | Status: AC
  Administered 2021-09-09 (×2): 40 meq via ORAL
  Filled 2021-09-09 (×2): qty 2

## 2021-09-09 MED ORDER — ACETAMINOPHEN 325 MG PO TABS: 650.0000 mg | ORAL_TABLET | Freq: Four times a day (QID) | ORAL | Status: AC | PRN

## 2021-09-09 NOTE — Discharge Summary (Addendum)
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 IMPRESSIONS  1. Left ventricular ejection fraction, by estimation, is 30 to 35%. The left ventricle has normal function. The left ventricle demonstrates regional wall motion abnormalities with peri-apical severe hypokinesis to akinesis.  2. Right ventricular systolic function is normal. The  right ventricular size is normal.  3. Left atrial size was mildly dilated. A left atrial/left atrial appendage thrombus was detected. There was a large up to 2.5 cm LA appendage thrombus present.  4. The mitral valve is normal in structure. No evidence of mitral valve regurgitation. No evidence of mitral stenosis.  5. The aortic valve is tricuspid. Aortic valve regurgitation is not visualized. Mild aortic valve sclerosis is present, with no evidence of aortic valve stenosis.  6. No PFO or ASD by color doppler. FINDINGS  Left Ventricle: Left ventricular ejection fraction, by estimation, is 30 to 35%. The left ventricle has normal function. The left ventricle demonstrates regional wall motion abnormalities. The left ventricular internal cavity size was normal in size. Right Ventricle: The right ventricular size is normal. No increase in right ventricular  wall thickness. Right ventricular systolic function is normal. Left Atrium: Left atrial size was mildly dilated. A left atrial/left atrial appendage thrombus was detected. Right Atrium: Right atrial size was normal in size. Pericardium: There is no evidence of pericardial effusion. Mitral Valve: The mitral valve is normal in structure. Mild mitral annular calcification. No evidence of mitral valve regurgitation. No evidence of mitral valve stenosis. Tricuspid Valve: The tricuspid valve is normal in structure. Tricuspid valve regurgitation is not demonstrated. Aortic Valve: The aortic valve is tricuspid. Aortic valve regurgitation is not visualized. Mild aortic valve sclerosis is present, with no evidence of aortic valve stenosis. Pulmonic Valve: The pulmonic valve was normal in structure. Pulmonic valve regurgitation is not visualized. Aorta: The aortic root is normal in size and structure. IAS/Shunts: No PFO or ASD by color doppler. [employer] Electronically signed by Franki Monte Signature Date/Time: 08/26/2021/5:53:00 PM    Final        Subjective: Patient is feeling well, no nausea or vomiting, no abdominal pain, no dyspnea or chest pain.   Discharge Exam: Vitals:   09/09/21 0534 09/09/21 0746  BP: 107/75 105/77  Pulse: 86 92  Resp: 20 20  Temp: 98.6 F (37 C) 98 F (36.7 C)  SpO2: 95% 100%   Vitals:   09/07/21 2147 09/08/21 0622 09/09/21 0534 09/09/21 0746  BP: 99/71 102/72 107/75 105/77  Pulse: 91 90 86 92  Resp: 16 18 20 20   Temp:  98.8 F (37.1 C) 98.6 F (37 C) 98 F (36.7 C)  TempSrc:  Axillary Oral Oral  SpO2: 98% 97% 95% 100%    General: Not in pain or dyspnea.  Neurology: Awake and alert, non focal  E ENT: mild pallor, no icterus, oral mucosa moist Cardiovascular: No JVD. S1-S2 present, rhythmic, no gallops, rubs, or murmurs. No lower extremity edema. Pulmonary: positive breath sounds bilaterally, adequate air movement, no wheezing, rhonchi or  rales. Gastrointestinal. Abdomen soft and non tender Skin. No rashes Musculoskeletal: right BKA and left AKA  The results of significant diagnostics from this hospitalization (including imaging, microbiology, ancillary and laboratory) are listed below for reference.     Microbiology: Recent Results (from the past 240 hour(s))  Urine Culture     Status: Abnormal   Collection Time: 09/02/21  2:53 PM   Specimen: Urine, Clean Catch  Result Value Ref Range Status   Specimen Description URINE, CLEAN CATCH  Final   Special Requests   Final    Normal Performed at [facility], [place] [address], [place], [place] [address]    Culture 60,000 COLONIES/mL ENTEROBACTER CLOACAE (A)  Final   Report Status 09/06/2021 FINAL  Final   Organism  ID, Bacteria ENTEROBACTER CLOACAE (A)  Final      Susceptibility   Enterobacter cloacae - MIC*    CEFAZOLIN >=64 RESISTANT Resistant     CEFEPIME 1 SENSITIVE Sensitive     CIPROFLOXACIN 2 INTERMEDIATE Intermediate     GENTAMICIN <=1 SENSITIVE Sensitive     IMIPENEM 0.5 SENSITIVE Sensitive     NITROFURANTOIN 64 INTERMEDIATE Intermediate     TRIMETH/SULFA <=20 SENSITIVE Sensitive     PIP/TAZO >=128 RESISTANT Resistant     * 60,000 COLONIES/mL ENTEROBACTER CLOACAE     Labs: BNP (last 3 results) No results for input(s): BNP in the last 8760 hours. Basic Metabolic Panel: Recent Labs  Lab 09/04/21 0200 09/05/21 0245 09/07/21 0313 09/08/21 0315 09/09/21 0350  NA 129* 130* 132* 132* 131*  K 3.0* 3.3* 3.2* 3.5 3.2*  CL 100 102 102 102 104  CO2 18* 18* 18* 19* 16*  GLUCOSE 114* 161* 195* 245* 156*  BUN 23 21 20 21 21   CREATININE 1.37* 1.38* 1.53* 1.53* 1.32*  CALCIUM 8.1* 8.4* 8.4* 8.2* 7.8*   Liver Function Tests: No results for input(s): AST, ALT, ALKPHOS, BILITOT, PROT, ALBUMIN in the last 168 hours. No results for input(s): LIPASE, AMYLASE in the last 168 hours. No results for input(s): AMMONIA in the last 168 hours. CBC: Recent Labs  Lab  09/03/21 0329 09/04/21 0200 09/05/21 0245  WBC 16.6* 14.0* 13.5*  HGB 13.2 12.1* 11.9*  HCT 38.0* 35.1* 34.6*  MCV 93.8 92.9 92.8  PLT 331 308 283   Cardiac Enzymes: No results for input(s): CKTOTAL, CKMB, CKMBINDEX, TROPONINI in the last 168 hours. BNP: Invalid input(s): POCBNP CBG: Recent Labs  Lab 09/05/21 1208 09/05/21 1635 09/06/21 1102 09/06/21 2029 09/09/21 0752  GLUCAP 157* 141* 97 150* 140*   D-Dimer No results for input(s): DDIMER in the last 72 hours. Hgb A1c No results for input(s): HGBA1C in the last 72 hours. Lipid Profile No results for input(s): CHOL, HDL, LDLCALC, TRIG, CHOLHDL, LDLDIRECT in the last 72 hours. Thyroid function studies No results for input(s): TSH, T4TOTAL, T3FREE, THYROIDAB in the last 72 hours.  Invalid input(s): FREET3 Anemia work up No results for input(s): VITAMINB12, FOLATE, FERRITIN, TIBC, IRON, RETICCTPCT in the last 72 hours. Urinalysis    Component Value Date/Time   COLORURINE YELLOW 09/03/2021 1447   APPEARANCEUR CLOUDY (A) 09/03/2021 1447   LABSPEC 1.010 09/03/2021 1447   PHURINE 6.0 09/03/2021 1447   GLUCOSEU >=500 (A) 09/03/2021 1447   HGBUR SMALL (A) 09/03/2021 1447   BILIRUBINUR SMALL (A) 09/03/2021 1447   KETONESUR 15 (A) 09/03/2021 1447   PROTEINUR NEGATIVE 09/03/2021 1447   NITRITE NEGATIVE 09/03/2021 1447   LEUKOCYTESUR TRACE (A) 09/03/2021 1447   Sepsis Labs Invalid input(s): PROCALCITONIN,  WBC,  LACTICIDVEN Microbiology Recent Results (from the past 240 hour(s))  Urine Culture     Status: Abnormal   Collection Time: 09/02/21  2:53 PM   Specimen: Urine, Clean Catch  Result Value Ref Range Status   Specimen Description URINE, CLEAN CATCH  Final   Special Requests   Final    Normal Performed at [facility], [place] [address], [place], [place] [address]    Culture 60,000 COLONIES/mL ENTEROBACTER CLOACAE (A)  Final   Report Status 09/06/2021 FINAL  Final   Organism ID, Bacteria ENTEROBACTER  CLOACAE (A)  Final      Susceptibility   Enterobacter cloacae - MIC*    CEFAZOLIN >=64 RESISTANT Resistant     CEFEPIME 1 SENSITIVE  Sensitive     CIPROFLOXACIN 2 INTERMEDIATE Intermediate     GENTAMICIN <=1 SENSITIVE Sensitive     IMIPENEM 0.5 SENSITIVE Sensitive     NITROFURANTOIN 64 INTERMEDIATE Intermediate     TRIMETH/SULFA <=20 SENSITIVE Sensitive     PIP/TAZO >=128 RESISTANT Resistant     * 60,000 COLONIES/mL ENTEROBACTER CLOACAE     Time coordinating discharge: 45 minutes  SIGNED:   Tawni Millers, MD  [facility] 09/09/2021, 11:04 AM

## 2021-09-09 NOTE — Care Management Important Message (Signed)
Important Message  Patient Details  Name: Shawn [name] MRN: [id number] Date of Birth: 05-05-1951   Medicare Important Message Given:  Yes     Orbie Pyo 09/09/2021, 4:23 PM

## 2021-09-09 NOTE — TOC Progression Note (Addendum)
Transition of Care [facility]) - Progression Note    Patient Details  Name: VAIBHAV FOGLEMAN MRN: [id number] Date of Birth: 1951-05-05  [facility]) CM/SW Contact  Joanne Chars, LCSW Phone Number: 09/09/2021, 11:23 AM  Clinical Narrative:   CSW spoke with Arbie Cookey at [employer].  She does not have insurance auth back yet, will call once she receives it.  Could potentially take today if it does come back today.   1430: TC Carol, Alfonse Flavors is back but she would like to wait for transfer until tomorrow due to the likelihood that pt would arrive very late if he came today.      Expected Discharge Plan: Skilled Nursing Facility Barriers to Discharge: Other (must enter comment) (completion of covid quarantine)  Expected Discharge Plan and Services Expected Discharge Plan: [place] Choice: [place] arrangements for the past 2 months: Single Family Home                                       Social Determinants of Health (SDOH) Interventions    Readmission Risk Interventions No flowsheet data found.

## 2021-09-10 ENCOUNTER — Other Ambulatory Visit: Payer: Self-pay

## 2021-09-10 DIAGNOSIS — E1165 Type 2 diabetes mellitus with hyperglycemia: Secondary | ICD-10-CM | POA: Diagnosis not present

## 2021-09-10 DIAGNOSIS — I2510 Atherosclerotic heart disease of native coronary artery without angina pectoris: Secondary | ICD-10-CM | POA: Diagnosis not present

## 2021-09-10 DIAGNOSIS — U071 COVID-19: Secondary | ICD-10-CM | POA: Diagnosis not present

## 2021-09-10 DIAGNOSIS — I10 Essential (primary) hypertension: Secondary | ICD-10-CM | POA: Diagnosis not present

## 2021-09-10 DIAGNOSIS — E785 Hyperlipidemia, unspecified: Secondary | ICD-10-CM | POA: Diagnosis not present

## 2021-09-10 DIAGNOSIS — I739 Peripheral vascular disease, unspecified: Secondary | ICD-10-CM | POA: Diagnosis not present

## 2021-09-10 DIAGNOSIS — S88111A Complete traumatic amputation at level between knee and ankle, right lower leg, initial encounter: Secondary | ICD-10-CM | POA: Diagnosis not present

## 2021-09-10 DIAGNOSIS — M6281 Muscle weakness (generalized): Secondary | ICD-10-CM | POA: Diagnosis not present

## 2021-09-10 DIAGNOSIS — Z8673 Personal history of transient ischemic attack (TIA), and cerebral infarction without residual deficits: Secondary | ICD-10-CM | POA: Diagnosis not present

## 2021-09-10 DIAGNOSIS — I69322 Dysarthria following cerebral infarction: Secondary | ICD-10-CM | POA: Diagnosis not present

## 2021-09-10 DIAGNOSIS — R41841 Cognitive communication deficit: Secondary | ICD-10-CM | POA: Diagnosis not present

## 2021-09-10 DIAGNOSIS — Z13220 Encounter for screening for lipoid disorders: Secondary | ICD-10-CM | POA: Diagnosis not present

## 2021-09-10 DIAGNOSIS — I69351 Hemiplegia and hemiparesis following cerebral infarction affecting right dominant side: Secondary | ICD-10-CM | POA: Diagnosis not present

## 2021-09-10 DIAGNOSIS — R6889 Other general symptoms and signs: Secondary | ICD-10-CM | POA: Diagnosis not present

## 2021-09-10 DIAGNOSIS — Z89612 Acquired absence of left leg above knee: Secondary | ICD-10-CM | POA: Diagnosis not present

## 2021-09-10 DIAGNOSIS — Z9181 History of falling: Secondary | ICD-10-CM | POA: Diagnosis not present

## 2021-09-10 DIAGNOSIS — R404 Transient alteration of awareness: Secondary | ICD-10-CM | POA: Diagnosis not present

## 2021-09-10 DIAGNOSIS — R5381 Other malaise: Secondary | ICD-10-CM | POA: Diagnosis not present

## 2021-09-10 DIAGNOSIS — N179 Acute kidney failure, unspecified: Secondary | ICD-10-CM | POA: Diagnosis not present

## 2021-09-10 DIAGNOSIS — S22080A Wedge compression fracture of T11-T12 vertebra, initial encounter for closed fracture: Secondary | ICD-10-CM | POA: Diagnosis not present

## 2021-09-10 DIAGNOSIS — S78112A Complete traumatic amputation at level between left hip and knee, initial encounter: Secondary | ICD-10-CM | POA: Diagnosis not present

## 2021-09-10 DIAGNOSIS — R531 Weakness: Secondary | ICD-10-CM | POA: Diagnosis not present

## 2021-09-10 DIAGNOSIS — I63442 Cerebral infarction due to embolism of left cerebellar artery: Secondary | ICD-10-CM | POA: Diagnosis not present

## 2021-09-10 DIAGNOSIS — I63422 Cerebral infarction due to embolism of left anterior cerebral artery: Secondary | ICD-10-CM | POA: Diagnosis not present

## 2021-09-10 DIAGNOSIS — N189 Chronic kidney disease, unspecified: Secondary | ICD-10-CM | POA: Diagnosis not present

## 2021-09-10 DIAGNOSIS — Z7401 Bed confinement status: Secondary | ICD-10-CM | POA: Diagnosis not present

## 2021-09-10 DIAGNOSIS — R109 Unspecified abdominal pain: Secondary | ICD-10-CM | POA: Diagnosis not present

## 2021-09-10 NOTE — Patient Outreach (Signed)
[facility]) Care Management  09/10/2021  Shawn [name] 09-15-1951 [id number]   Patient recently hospitalized.  Discharged to skilled facility.  Plan: RN CM will close case.  [name], RN, MSN [place] Management Care Management Coordinator Direct Line [phone] Cell [phone] Toll Free: [phone]  Fax: [phone]

## 2021-09-10 NOTE — Progress Notes (Signed)
PT Cancellation Note  Patient Details Name: SEELEY HISSONG MRN: [id number] DOB: 11-20-1951   Cancelled Treatment:    Reason Eval/Treat Not Completed: Other (comment), patient with PTAR preparing for d/c to SNF.  Mabeline Caras, PT, DPT Acute Rehabilitation Services  Pager [phone] Office [place] 09/10/2021, 11:27 AM

## 2021-09-10 NOTE — Progress Notes (Signed)
OT Cancellation Note  Patient Details Name: XZAVIAR MALOOF MRN: [id number] DOB: 15-Jun-1951   Cancelled Treatment:    Reason Eval/Treat Not Completed: Other (comment). PTAR in the room to transport pt to SNF. Did ask pt about built-up foam for eating that was given a couple of days ago by this OT. He indicated he did not like it. Told pt next venue can further address AE needs/strategies for feeding.   Tyrone Schimke, OT Acute Rehabilitation Services Pager: [phone] Office: [phone]  09/10/2021, 11:27 AM

## 2021-09-10 NOTE — TOC Transition Note (Signed)
Transition of Care [facility]) - CM/SW Discharge Note   Patient Details  Name: Shawn [name] MRN: [id number] Date of Birth: 05/10/1951  Transition of Care [facility]) CM/SW Contact:  Joanne Chars, LCSW Phone Number: 09/10/2021, 9:43 AM   Clinical Narrative: Pt [facility] [place], room 210A.  RN call [phone] for report.       Final next level of care: Skilled Nursing Facility Barriers to Discharge: Barriers Resolved   Patient Goals and CMS Choice   CMS Medicare.gov Compare Post Acute Care list provided to::  (na-wants to return to [facility] [place])    Discharge Placement              Patient chooses bed at:  ([facility] [place]) Patient to be transferred to facility by: [place] Name of family member notified: daughter Overton Mam Patient and family notified of of transfer: 09/10/21  Discharge Plan and Services     Post Acute Care Choice: [place]                               Social Determinants of Health (SDOH) Interventions     Readmission Risk Interventions No flowsheet data found.

## 2021-09-10 NOTE — Progress Notes (Signed)
Shawn [name] has remained stable, no chest pain, no abdominal pain, tolerating p.o. diet adequately.  His vital signs this morning show a temperature of 99, blood pressure 113/77, heart rate 92, respiratory rate 17 and oxygen saturation 99% on room air.  Lungs are clear to auscultation. Heart S1-S2, present, rhythmic. Abdomen soft nontender. Right BKA and left AKA no edema  SARS COVID-19 infection resolved.  Plan to return patient to skilled nursing facility.

## 2021-09-17 DIAGNOSIS — R5381 Other malaise: Secondary | ICD-10-CM | POA: Diagnosis not present

## 2021-09-17 DIAGNOSIS — I739 Peripheral vascular disease, unspecified: Secondary | ICD-10-CM | POA: Diagnosis not present

## 2021-09-17 DIAGNOSIS — S78112A Complete traumatic amputation at level between left hip and knee, initial encounter: Secondary | ICD-10-CM | POA: Diagnosis not present

## 2021-09-17 DIAGNOSIS — S88111A Complete traumatic amputation at level between knee and ankle, right lower leg, initial encounter: Secondary | ICD-10-CM | POA: Diagnosis not present

## 2021-09-19 DIAGNOSIS — R6889 Other general symptoms and signs: Secondary | ICD-10-CM | POA: Diagnosis not present

## 2021-09-19 DIAGNOSIS — R109 Unspecified abdominal pain: Secondary | ICD-10-CM | POA: Diagnosis not present

## 2021-09-19 DIAGNOSIS — I10 Essential (primary) hypertension: Secondary | ICD-10-CM | POA: Diagnosis not present

## 2021-09-19 DIAGNOSIS — Z13220 Encounter for screening for lipoid disorders: Secondary | ICD-10-CM | POA: Diagnosis not present

## 2021-09-20 ENCOUNTER — Other Ambulatory Visit: Payer: Self-pay

## 2021-09-20 NOTE — Patient Outreach (Signed)
[facility]) Care Management  09/20/2021  Shawn [name] Sep 24, 1951 [id number]   Date of Review: 09/20/21 Reason:  Readmission  PCP: [name], [facility] Insurance: [employer] Medical Info: Shawn [name] is a 70 y.o. male with known history of diabetes mellitus type 2, hypertension, peripheral vascular disease status post right BKA and left AKA hyperlipidemia.  Patient lives alone but is independent with care.  He has some limited family support.     Admissions: Patient with 2 admissions in 30 days  Patient admitted 08/22/21 to 08/29/21 After being found with right sided weakness and confused.  Patient diagnosed with left cerebral infarct and started on Eliquis.  Patient discharged to SNF.   Patient re-admitted 08/29/21 to 09/10/21 for positive COVID test.  COVID test was not resulted until after discharge to facility.  Facility was notified and patient sent back to the hospital for treatment as they do not have a COVID unit there.   Patient discharged to SNF after COVID quarantine time.      Disposition: Discharge to SNF.    [name], RN, MSN [place] Management Care Management Coordinator Direct Line [phone] Cell [phone] Toll Free: [phone]  Fax: [phone]

## 2021-09-23 DIAGNOSIS — S22080A Wedge compression fracture of T11-T12 vertebra, initial encounter for closed fracture: Secondary | ICD-10-CM | POA: Diagnosis not present

## 2021-09-23 DIAGNOSIS — R109 Unspecified abdominal pain: Secondary | ICD-10-CM | POA: Diagnosis not present

## 2021-09-25 ENCOUNTER — Ambulatory Visit: Payer: Self-pay

## 2021-10-19 DIAGNOSIS — E7801 Familial hypercholesterolemia: Secondary | ICD-10-CM | POA: Diagnosis not present

## 2021-10-19 DIAGNOSIS — M2550 Pain in unspecified joint: Secondary | ICD-10-CM | POA: Diagnosis not present

## 2021-10-19 DIAGNOSIS — D649 Anemia, unspecified: Secondary | ICD-10-CM | POA: Diagnosis not present

## 2021-10-19 DIAGNOSIS — I110 Hypertensive heart disease with heart failure: Secondary | ICD-10-CM | POA: Diagnosis not present

## 2021-10-19 DIAGNOSIS — D638 Anemia in other chronic diseases classified elsewhere: Secondary | ICD-10-CM | POA: Diagnosis not present

## 2021-10-19 DIAGNOSIS — R7401 Elevation of levels of liver transaminase levels: Secondary | ICD-10-CM | POA: Diagnosis not present

## 2021-10-19 DIAGNOSIS — E7800 Pure hypercholesterolemia, unspecified: Secondary | ICD-10-CM | POA: Diagnosis not present

## 2021-10-19 DIAGNOSIS — I509 Heart failure, unspecified: Secondary | ICD-10-CM | POA: Diagnosis not present

## 2021-10-19 DIAGNOSIS — Z89511 Acquired absence of right leg below knee: Secondary | ICD-10-CM | POA: Diagnosis not present

## 2021-10-19 DIAGNOSIS — J918 Pleural effusion in other conditions classified elsewhere: Secondary | ICD-10-CM | POA: Diagnosis not present

## 2021-10-19 DIAGNOSIS — J90 Pleural effusion, not elsewhere classified: Secondary | ICD-10-CM | POA: Diagnosis not present

## 2021-10-19 DIAGNOSIS — Z7401 Bed confinement status: Secondary | ICD-10-CM | POA: Diagnosis not present

## 2021-10-19 DIAGNOSIS — I10 Essential (primary) hypertension: Secondary | ICD-10-CM | POA: Diagnosis not present

## 2021-10-19 DIAGNOSIS — E1159 Type 2 diabetes mellitus with other circulatory complications: Secondary | ICD-10-CM | POA: Diagnosis not present

## 2021-10-19 DIAGNOSIS — E119 Type 2 diabetes mellitus without complications: Secondary | ICD-10-CM | POA: Diagnosis not present

## 2021-10-19 DIAGNOSIS — I5020 Unspecified systolic (congestive) heart failure: Secondary | ICD-10-CM | POA: Diagnosis not present

## 2021-10-19 DIAGNOSIS — I129 Hypertensive chronic kidney disease with stage 1 through stage 4 chronic kidney disease, or unspecified chronic kidney disease: Secondary | ICD-10-CM | POA: Diagnosis not present

## 2021-10-19 DIAGNOSIS — R0902 Hypoxemia: Secondary | ICD-10-CM | POA: Diagnosis not present

## 2021-10-19 DIAGNOSIS — Z89612 Acquired absence of left leg above knee: Secondary | ICD-10-CM | POA: Diagnosis not present

## 2021-10-19 DIAGNOSIS — F32A Depression, unspecified: Secondary | ICD-10-CM | POA: Diagnosis not present

## 2021-10-19 DIAGNOSIS — Z89611 Acquired absence of right leg above knee: Secondary | ICD-10-CM | POA: Diagnosis not present

## 2021-10-19 DIAGNOSIS — D509 Iron deficiency anemia, unspecified: Secondary | ICD-10-CM | POA: Diagnosis not present

## 2021-10-19 DIAGNOSIS — Z8616 Personal history of COVID-19: Secondary | ICD-10-CM | POA: Diagnosis not present

## 2021-10-19 DIAGNOSIS — N189 Chronic kidney disease, unspecified: Secondary | ICD-10-CM | POA: Diagnosis not present

## 2021-10-24 DIAGNOSIS — I509 Heart failure, unspecified: Secondary | ICD-10-CM | POA: Diagnosis not present

## 2021-10-24 DIAGNOSIS — D649 Anemia, unspecified: Secondary | ICD-10-CM | POA: Diagnosis not present

## 2021-10-24 DIAGNOSIS — J90 Pleural effusion, not elsewhere classified: Secondary | ICD-10-CM | POA: Diagnosis not present

## 2021-10-24 DIAGNOSIS — M2550 Pain in unspecified joint: Secondary | ICD-10-CM | POA: Diagnosis not present

## 2021-10-24 DIAGNOSIS — E7801 Familial hypercholesterolemia: Secondary | ICD-10-CM | POA: Diagnosis not present

## 2021-10-24 DIAGNOSIS — Z89511 Acquired absence of right leg below knee: Secondary | ICD-10-CM | POA: Diagnosis not present

## 2021-10-24 DIAGNOSIS — Z794 Long term (current) use of insulin: Secondary | ICD-10-CM | POA: Diagnosis not present

## 2021-10-24 DIAGNOSIS — Z89612 Acquired absence of left leg above knee: Secondary | ICD-10-CM | POA: Diagnosis not present

## 2021-10-24 DIAGNOSIS — E1151 Type 2 diabetes mellitus with diabetic peripheral angiopathy without gangrene: Secondary | ICD-10-CM | POA: Diagnosis not present

## 2021-10-24 DIAGNOSIS — Z8616 Personal history of COVID-19: Secondary | ICD-10-CM | POA: Diagnosis not present

## 2021-10-24 DIAGNOSIS — Z23 Encounter for immunization: Secondary | ICD-10-CM | POA: Diagnosis not present

## 2021-10-24 DIAGNOSIS — E1159 Type 2 diabetes mellitus with other circulatory complications: Secondary | ICD-10-CM | POA: Diagnosis not present

## 2021-10-24 DIAGNOSIS — R7401 Elevation of levels of liver transaminase levels: Secondary | ICD-10-CM | POA: Diagnosis not present

## 2021-10-24 DIAGNOSIS — E7800 Pure hypercholesterolemia, unspecified: Secondary | ICD-10-CM | POA: Diagnosis not present

## 2021-10-24 DIAGNOSIS — I10 Essential (primary) hypertension: Secondary | ICD-10-CM | POA: Diagnosis not present

## 2021-10-24 DIAGNOSIS — Z7401 Bed confinement status: Secondary | ICD-10-CM | POA: Diagnosis not present

## 2021-10-31 DIAGNOSIS — J90 Pleural effusion, not elsewhere classified: Secondary | ICD-10-CM | POA: Diagnosis not present

## 2021-10-31 DIAGNOSIS — D649 Anemia, unspecified: Secondary | ICD-10-CM | POA: Diagnosis not present

## 2021-10-31 DIAGNOSIS — E1151 Type 2 diabetes mellitus with diabetic peripheral angiopathy without gangrene: Secondary | ICD-10-CM | POA: Diagnosis not present

## 2021-10-31 DIAGNOSIS — Z23 Encounter for immunization: Secondary | ICD-10-CM | POA: Diagnosis not present

## 2021-10-31 DIAGNOSIS — Z794 Long term (current) use of insulin: Secondary | ICD-10-CM | POA: Diagnosis not present

## 2021-10-31 DIAGNOSIS — I509 Heart failure, unspecified: Secondary | ICD-10-CM | POA: Diagnosis not present

## 2021-11-25 DIAGNOSIS — R7401 Elevation of levels of liver transaminase levels: Secondary | ICD-10-CM | POA: Diagnosis not present

## 2021-11-25 DIAGNOSIS — Z89511 Acquired absence of right leg below knee: Secondary | ICD-10-CM | POA: Diagnosis not present

## 2021-11-25 DIAGNOSIS — M6281 Muscle weakness (generalized): Secondary | ICD-10-CM | POA: Diagnosis not present

## 2021-11-25 DIAGNOSIS — Z89612 Acquired absence of left leg above knee: Secondary | ICD-10-CM | POA: Diagnosis not present

## 2021-11-25 DIAGNOSIS — Z7409 Other reduced mobility: Secondary | ICD-10-CM | POA: Diagnosis not present

## 2021-11-25 DIAGNOSIS — R41841 Cognitive communication deficit: Secondary | ICD-10-CM | POA: Diagnosis not present

## 2021-11-25 DIAGNOSIS — Z993 Dependence on wheelchair: Secondary | ICD-10-CM | POA: Diagnosis not present

## 2021-11-25 DIAGNOSIS — E1159 Type 2 diabetes mellitus with other circulatory complications: Secondary | ICD-10-CM | POA: Diagnosis not present

## 2021-11-25 DIAGNOSIS — E7800 Pure hypercholesterolemia, unspecified: Secondary | ICD-10-CM | POA: Diagnosis not present

## 2021-11-26 ENCOUNTER — Other Ambulatory Visit: Payer: Self-pay

## 2021-11-26 DIAGNOSIS — R7401 Elevation of levels of liver transaminase levels: Secondary | ICD-10-CM | POA: Diagnosis not present

## 2021-11-26 DIAGNOSIS — E7800 Pure hypercholesterolemia, unspecified: Secondary | ICD-10-CM | POA: Diagnosis not present

## 2021-11-26 DIAGNOSIS — Z89612 Acquired absence of left leg above knee: Secondary | ICD-10-CM | POA: Diagnosis not present

## 2021-11-26 DIAGNOSIS — Z993 Dependence on wheelchair: Secondary | ICD-10-CM | POA: Diagnosis not present

## 2021-11-26 DIAGNOSIS — M6281 Muscle weakness (generalized): Secondary | ICD-10-CM | POA: Diagnosis not present

## 2021-11-26 DIAGNOSIS — Z89511 Acquired absence of right leg below knee: Secondary | ICD-10-CM | POA: Diagnosis not present

## 2021-11-26 DIAGNOSIS — Z7409 Other reduced mobility: Secondary | ICD-10-CM | POA: Diagnosis not present

## 2021-11-26 DIAGNOSIS — E1159 Type 2 diabetes mellitus with other circulatory complications: Secondary | ICD-10-CM | POA: Diagnosis not present

## 2021-11-26 DIAGNOSIS — R41841 Cognitive communication deficit: Secondary | ICD-10-CM | POA: Diagnosis not present

## 2021-11-26 NOTE — Patient Outreach (Signed)
[facility]) Care Management  11/26/2021  ABUNDIO TEUSCHER Jan 10, 1951 [id number]   Referral Date: 11/26/21 Referral Source: [employer] Report Date of Discharge:11/23/21  Facility: [facility] Insurance: [facility]  Referral received.  Patient remains at [facility].    Plan: RN CM will close case.     [name], RN, MSN [facility] Care Management Care Management Coordinator Direct Line [phone] Toll Free: [phone]  Fax: [phone]

## 2021-11-27 DIAGNOSIS — Z993 Dependence on wheelchair: Secondary | ICD-10-CM | POA: Diagnosis not present

## 2021-11-27 DIAGNOSIS — R41841 Cognitive communication deficit: Secondary | ICD-10-CM | POA: Diagnosis not present

## 2021-11-27 DIAGNOSIS — Z7409 Other reduced mobility: Secondary | ICD-10-CM | POA: Diagnosis not present

## 2021-11-27 DIAGNOSIS — E7800 Pure hypercholesterolemia, unspecified: Secondary | ICD-10-CM | POA: Diagnosis not present

## 2021-11-27 DIAGNOSIS — R7401 Elevation of levels of liver transaminase levels: Secondary | ICD-10-CM | POA: Diagnosis not present

## 2021-11-27 DIAGNOSIS — Z89612 Acquired absence of left leg above knee: Secondary | ICD-10-CM | POA: Diagnosis not present

## 2021-11-27 DIAGNOSIS — M6281 Muscle weakness (generalized): Secondary | ICD-10-CM | POA: Diagnosis not present

## 2021-11-27 DIAGNOSIS — E1159 Type 2 diabetes mellitus with other circulatory complications: Secondary | ICD-10-CM | POA: Diagnosis not present

## 2021-11-27 DIAGNOSIS — Z89511 Acquired absence of right leg below knee: Secondary | ICD-10-CM | POA: Diagnosis not present

## 2021-11-28 DIAGNOSIS — Z7409 Other reduced mobility: Secondary | ICD-10-CM | POA: Diagnosis not present

## 2021-11-28 DIAGNOSIS — M6281 Muscle weakness (generalized): Secondary | ICD-10-CM | POA: Diagnosis not present

## 2021-11-28 DIAGNOSIS — Z89612 Acquired absence of left leg above knee: Secondary | ICD-10-CM | POA: Diagnosis not present

## 2021-11-28 DIAGNOSIS — Z993 Dependence on wheelchair: Secondary | ICD-10-CM | POA: Diagnosis not present

## 2021-11-28 DIAGNOSIS — R41841 Cognitive communication deficit: Secondary | ICD-10-CM | POA: Diagnosis not present

## 2021-11-28 DIAGNOSIS — E1159 Type 2 diabetes mellitus with other circulatory complications: Secondary | ICD-10-CM | POA: Diagnosis not present

## 2021-11-28 DIAGNOSIS — Z89511 Acquired absence of right leg below knee: Secondary | ICD-10-CM | POA: Diagnosis not present

## 2021-11-28 DIAGNOSIS — R7401 Elevation of levels of liver transaminase levels: Secondary | ICD-10-CM | POA: Diagnosis not present

## 2021-11-28 DIAGNOSIS — E7800 Pure hypercholesterolemia, unspecified: Secondary | ICD-10-CM | POA: Diagnosis not present

## 2021-11-29 DIAGNOSIS — Z7409 Other reduced mobility: Secondary | ICD-10-CM | POA: Diagnosis not present

## 2021-11-29 DIAGNOSIS — Z89511 Acquired absence of right leg below knee: Secondary | ICD-10-CM | POA: Diagnosis not present

## 2021-11-29 DIAGNOSIS — E1159 Type 2 diabetes mellitus with other circulatory complications: Secondary | ICD-10-CM | POA: Diagnosis not present

## 2021-11-29 DIAGNOSIS — Z993 Dependence on wheelchair: Secondary | ICD-10-CM | POA: Diagnosis not present

## 2021-11-29 DIAGNOSIS — R41841 Cognitive communication deficit: Secondary | ICD-10-CM | POA: Diagnosis not present

## 2021-11-29 DIAGNOSIS — M6281 Muscle weakness (generalized): Secondary | ICD-10-CM | POA: Diagnosis not present

## 2021-11-29 DIAGNOSIS — R7401 Elevation of levels of liver transaminase levels: Secondary | ICD-10-CM | POA: Diagnosis not present

## 2021-11-29 DIAGNOSIS — Z89612 Acquired absence of left leg above knee: Secondary | ICD-10-CM | POA: Diagnosis not present

## 2021-11-29 DIAGNOSIS — E7800 Pure hypercholesterolemia, unspecified: Secondary | ICD-10-CM | POA: Diagnosis not present

## 2021-12-02 DIAGNOSIS — R7401 Elevation of levels of liver transaminase levels: Secondary | ICD-10-CM | POA: Diagnosis not present

## 2021-12-02 DIAGNOSIS — Z89612 Acquired absence of left leg above knee: Secondary | ICD-10-CM | POA: Diagnosis not present

## 2021-12-02 DIAGNOSIS — E1159 Type 2 diabetes mellitus with other circulatory complications: Secondary | ICD-10-CM | POA: Diagnosis not present

## 2021-12-02 DIAGNOSIS — R41841 Cognitive communication deficit: Secondary | ICD-10-CM | POA: Diagnosis not present

## 2021-12-02 DIAGNOSIS — Z993 Dependence on wheelchair: Secondary | ICD-10-CM | POA: Diagnosis not present

## 2021-12-02 DIAGNOSIS — M6281 Muscle weakness (generalized): Secondary | ICD-10-CM | POA: Diagnosis not present

## 2021-12-02 DIAGNOSIS — E7800 Pure hypercholesterolemia, unspecified: Secondary | ICD-10-CM | POA: Diagnosis not present

## 2021-12-02 DIAGNOSIS — Z89511 Acquired absence of right leg below knee: Secondary | ICD-10-CM | POA: Diagnosis not present

## 2021-12-02 DIAGNOSIS — Z7409 Other reduced mobility: Secondary | ICD-10-CM | POA: Diagnosis not present

## 2021-12-03 DIAGNOSIS — E7800 Pure hypercholesterolemia, unspecified: Secondary | ICD-10-CM | POA: Diagnosis not present

## 2021-12-03 DIAGNOSIS — Z89511 Acquired absence of right leg below knee: Secondary | ICD-10-CM | POA: Diagnosis not present

## 2021-12-03 DIAGNOSIS — E1159 Type 2 diabetes mellitus with other circulatory complications: Secondary | ICD-10-CM | POA: Diagnosis not present

## 2021-12-03 DIAGNOSIS — Z993 Dependence on wheelchair: Secondary | ICD-10-CM | POA: Diagnosis not present

## 2021-12-03 DIAGNOSIS — Z89612 Acquired absence of left leg above knee: Secondary | ICD-10-CM | POA: Diagnosis not present

## 2021-12-03 DIAGNOSIS — R7401 Elevation of levels of liver transaminase levels: Secondary | ICD-10-CM | POA: Diagnosis not present

## 2021-12-03 DIAGNOSIS — R41841 Cognitive communication deficit: Secondary | ICD-10-CM | POA: Diagnosis not present

## 2021-12-03 DIAGNOSIS — M6281 Muscle weakness (generalized): Secondary | ICD-10-CM | POA: Diagnosis not present

## 2021-12-03 DIAGNOSIS — Z7409 Other reduced mobility: Secondary | ICD-10-CM | POA: Diagnosis not present

## 2021-12-04 DIAGNOSIS — M6281 Muscle weakness (generalized): Secondary | ICD-10-CM | POA: Diagnosis not present

## 2021-12-04 DIAGNOSIS — E1159 Type 2 diabetes mellitus with other circulatory complications: Secondary | ICD-10-CM | POA: Diagnosis not present

## 2021-12-04 DIAGNOSIS — R41841 Cognitive communication deficit: Secondary | ICD-10-CM | POA: Diagnosis not present

## 2021-12-04 DIAGNOSIS — Z89612 Acquired absence of left leg above knee: Secondary | ICD-10-CM | POA: Diagnosis not present

## 2021-12-04 DIAGNOSIS — Z7409 Other reduced mobility: Secondary | ICD-10-CM | POA: Diagnosis not present

## 2021-12-04 DIAGNOSIS — Z993 Dependence on wheelchair: Secondary | ICD-10-CM | POA: Diagnosis not present

## 2021-12-04 DIAGNOSIS — Z89511 Acquired absence of right leg below knee: Secondary | ICD-10-CM | POA: Diagnosis not present

## 2021-12-04 DIAGNOSIS — R7401 Elevation of levels of liver transaminase levels: Secondary | ICD-10-CM | POA: Diagnosis not present

## 2021-12-04 DIAGNOSIS — E7800 Pure hypercholesterolemia, unspecified: Secondary | ICD-10-CM | POA: Diagnosis not present

## 2021-12-05 DIAGNOSIS — Z89511 Acquired absence of right leg below knee: Secondary | ICD-10-CM | POA: Diagnosis not present

## 2021-12-05 DIAGNOSIS — M6281 Muscle weakness (generalized): Secondary | ICD-10-CM | POA: Diagnosis not present

## 2021-12-05 DIAGNOSIS — R7401 Elevation of levels of liver transaminase levels: Secondary | ICD-10-CM | POA: Diagnosis not present

## 2021-12-05 DIAGNOSIS — Z89612 Acquired absence of left leg above knee: Secondary | ICD-10-CM | POA: Diagnosis not present

## 2021-12-05 DIAGNOSIS — E7800 Pure hypercholesterolemia, unspecified: Secondary | ICD-10-CM | POA: Diagnosis not present

## 2021-12-05 DIAGNOSIS — R41841 Cognitive communication deficit: Secondary | ICD-10-CM | POA: Diagnosis not present

## 2021-12-05 DIAGNOSIS — Z7409 Other reduced mobility: Secondary | ICD-10-CM | POA: Diagnosis not present

## 2021-12-05 DIAGNOSIS — Z993 Dependence on wheelchair: Secondary | ICD-10-CM | POA: Diagnosis not present

## 2021-12-05 DIAGNOSIS — E1159 Type 2 diabetes mellitus with other circulatory complications: Secondary | ICD-10-CM | POA: Diagnosis not present

## 2021-12-06 DIAGNOSIS — R7401 Elevation of levels of liver transaminase levels: Secondary | ICD-10-CM | POA: Diagnosis not present

## 2021-12-06 DIAGNOSIS — E1159 Type 2 diabetes mellitus with other circulatory complications: Secondary | ICD-10-CM | POA: Diagnosis not present

## 2021-12-06 DIAGNOSIS — Z7409 Other reduced mobility: Secondary | ICD-10-CM | POA: Diagnosis not present

## 2021-12-06 DIAGNOSIS — M6281 Muscle weakness (generalized): Secondary | ICD-10-CM | POA: Diagnosis not present

## 2021-12-06 DIAGNOSIS — R41841 Cognitive communication deficit: Secondary | ICD-10-CM | POA: Diagnosis not present

## 2021-12-06 DIAGNOSIS — Z993 Dependence on wheelchair: Secondary | ICD-10-CM | POA: Diagnosis not present

## 2021-12-06 DIAGNOSIS — Z89511 Acquired absence of right leg below knee: Secondary | ICD-10-CM | POA: Diagnosis not present

## 2021-12-06 DIAGNOSIS — Z89612 Acquired absence of left leg above knee: Secondary | ICD-10-CM | POA: Diagnosis not present

## 2021-12-06 DIAGNOSIS — E7800 Pure hypercholesterolemia, unspecified: Secondary | ICD-10-CM | POA: Diagnosis not present

## 2021-12-09 DIAGNOSIS — M6281 Muscle weakness (generalized): Secondary | ICD-10-CM | POA: Diagnosis not present

## 2021-12-09 DIAGNOSIS — E1159 Type 2 diabetes mellitus with other circulatory complications: Secondary | ICD-10-CM | POA: Diagnosis not present

## 2021-12-09 DIAGNOSIS — R7401 Elevation of levels of liver transaminase levels: Secondary | ICD-10-CM | POA: Diagnosis not present

## 2021-12-09 DIAGNOSIS — Z89511 Acquired absence of right leg below knee: Secondary | ICD-10-CM | POA: Diagnosis not present

## 2021-12-09 DIAGNOSIS — E7800 Pure hypercholesterolemia, unspecified: Secondary | ICD-10-CM | POA: Diagnosis not present

## 2021-12-09 DIAGNOSIS — Z993 Dependence on wheelchair: Secondary | ICD-10-CM | POA: Diagnosis not present

## 2021-12-09 DIAGNOSIS — Z7409 Other reduced mobility: Secondary | ICD-10-CM | POA: Diagnosis not present

## 2021-12-09 DIAGNOSIS — R41841 Cognitive communication deficit: Secondary | ICD-10-CM | POA: Diagnosis not present

## 2021-12-09 DIAGNOSIS — Z89612 Acquired absence of left leg above knee: Secondary | ICD-10-CM | POA: Diagnosis not present

## 2021-12-10 DIAGNOSIS — Z89511 Acquired absence of right leg below knee: Secondary | ICD-10-CM | POA: Diagnosis not present

## 2021-12-10 DIAGNOSIS — Z993 Dependence on wheelchair: Secondary | ICD-10-CM | POA: Diagnosis not present

## 2021-12-10 DIAGNOSIS — E1159 Type 2 diabetes mellitus with other circulatory complications: Secondary | ICD-10-CM | POA: Diagnosis not present

## 2021-12-10 DIAGNOSIS — Z89612 Acquired absence of left leg above knee: Secondary | ICD-10-CM | POA: Diagnosis not present

## 2021-12-10 DIAGNOSIS — R7401 Elevation of levels of liver transaminase levels: Secondary | ICD-10-CM | POA: Diagnosis not present

## 2021-12-10 DIAGNOSIS — Z7409 Other reduced mobility: Secondary | ICD-10-CM | POA: Diagnosis not present

## 2021-12-10 DIAGNOSIS — R41841 Cognitive communication deficit: Secondary | ICD-10-CM | POA: Diagnosis not present

## 2021-12-10 DIAGNOSIS — E7800 Pure hypercholesterolemia, unspecified: Secondary | ICD-10-CM | POA: Diagnosis not present

## 2021-12-10 DIAGNOSIS — M6281 Muscle weakness (generalized): Secondary | ICD-10-CM | POA: Diagnosis not present

## 2021-12-11 DIAGNOSIS — Z89612 Acquired absence of left leg above knee: Secondary | ICD-10-CM | POA: Diagnosis not present

## 2021-12-11 DIAGNOSIS — R7401 Elevation of levels of liver transaminase levels: Secondary | ICD-10-CM | POA: Diagnosis not present

## 2021-12-11 DIAGNOSIS — Z993 Dependence on wheelchair: Secondary | ICD-10-CM | POA: Diagnosis not present

## 2021-12-11 DIAGNOSIS — R41841 Cognitive communication deficit: Secondary | ICD-10-CM | POA: Diagnosis not present

## 2021-12-11 DIAGNOSIS — M6281 Muscle weakness (generalized): Secondary | ICD-10-CM | POA: Diagnosis not present

## 2021-12-11 DIAGNOSIS — Z89511 Acquired absence of right leg below knee: Secondary | ICD-10-CM | POA: Diagnosis not present

## 2021-12-11 DIAGNOSIS — E7800 Pure hypercholesterolemia, unspecified: Secondary | ICD-10-CM | POA: Diagnosis not present

## 2021-12-11 DIAGNOSIS — E1159 Type 2 diabetes mellitus with other circulatory complications: Secondary | ICD-10-CM | POA: Diagnosis not present

## 2021-12-11 DIAGNOSIS — Z7409 Other reduced mobility: Secondary | ICD-10-CM | POA: Diagnosis not present

## 2021-12-12 DIAGNOSIS — E1159 Type 2 diabetes mellitus with other circulatory complications: Secondary | ICD-10-CM | POA: Diagnosis not present

## 2021-12-12 DIAGNOSIS — Z993 Dependence on wheelchair: Secondary | ICD-10-CM | POA: Diagnosis not present

## 2021-12-12 DIAGNOSIS — Z89511 Acquired absence of right leg below knee: Secondary | ICD-10-CM | POA: Diagnosis not present

## 2021-12-12 DIAGNOSIS — M6281 Muscle weakness (generalized): Secondary | ICD-10-CM | POA: Diagnosis not present

## 2021-12-12 DIAGNOSIS — Z89612 Acquired absence of left leg above knee: Secondary | ICD-10-CM | POA: Diagnosis not present

## 2021-12-12 DIAGNOSIS — R41841 Cognitive communication deficit: Secondary | ICD-10-CM | POA: Diagnosis not present

## 2021-12-12 DIAGNOSIS — R7401 Elevation of levels of liver transaminase levels: Secondary | ICD-10-CM | POA: Diagnosis not present

## 2021-12-12 DIAGNOSIS — Z7409 Other reduced mobility: Secondary | ICD-10-CM | POA: Diagnosis not present

## 2021-12-12 DIAGNOSIS — E7800 Pure hypercholesterolemia, unspecified: Secondary | ICD-10-CM | POA: Diagnosis not present

## 2021-12-13 DIAGNOSIS — R7401 Elevation of levels of liver transaminase levels: Secondary | ICD-10-CM | POA: Diagnosis not present

## 2021-12-13 DIAGNOSIS — Z89511 Acquired absence of right leg below knee: Secondary | ICD-10-CM | POA: Diagnosis not present

## 2021-12-13 DIAGNOSIS — E1159 Type 2 diabetes mellitus with other circulatory complications: Secondary | ICD-10-CM | POA: Diagnosis not present

## 2021-12-13 DIAGNOSIS — Z993 Dependence on wheelchair: Secondary | ICD-10-CM | POA: Diagnosis not present

## 2021-12-13 DIAGNOSIS — Z7409 Other reduced mobility: Secondary | ICD-10-CM | POA: Diagnosis not present

## 2021-12-13 DIAGNOSIS — M6281 Muscle weakness (generalized): Secondary | ICD-10-CM | POA: Diagnosis not present

## 2021-12-13 DIAGNOSIS — E7800 Pure hypercholesterolemia, unspecified: Secondary | ICD-10-CM | POA: Diagnosis not present

## 2021-12-13 DIAGNOSIS — Z89612 Acquired absence of left leg above knee: Secondary | ICD-10-CM | POA: Diagnosis not present

## 2021-12-13 DIAGNOSIS — R41841 Cognitive communication deficit: Secondary | ICD-10-CM | POA: Diagnosis not present

## 2021-12-16 DIAGNOSIS — E7800 Pure hypercholesterolemia, unspecified: Secondary | ICD-10-CM | POA: Diagnosis not present

## 2021-12-16 DIAGNOSIS — R7401 Elevation of levels of liver transaminase levels: Secondary | ICD-10-CM | POA: Diagnosis not present

## 2021-12-16 DIAGNOSIS — Z89511 Acquired absence of right leg below knee: Secondary | ICD-10-CM | POA: Diagnosis not present

## 2021-12-16 DIAGNOSIS — E1159 Type 2 diabetes mellitus with other circulatory complications: Secondary | ICD-10-CM | POA: Diagnosis not present

## 2021-12-16 DIAGNOSIS — R41841 Cognitive communication deficit: Secondary | ICD-10-CM | POA: Diagnosis not present

## 2021-12-16 DIAGNOSIS — Z993 Dependence on wheelchair: Secondary | ICD-10-CM | POA: Diagnosis not present

## 2021-12-16 DIAGNOSIS — Z89612 Acquired absence of left leg above knee: Secondary | ICD-10-CM | POA: Diagnosis not present

## 2021-12-16 DIAGNOSIS — M6281 Muscle weakness (generalized): Secondary | ICD-10-CM | POA: Diagnosis not present

## 2021-12-16 DIAGNOSIS — Z7409 Other reduced mobility: Secondary | ICD-10-CM | POA: Diagnosis not present

## 2021-12-18 DIAGNOSIS — E1159 Type 2 diabetes mellitus with other circulatory complications: Secondary | ICD-10-CM | POA: Diagnosis not present

## 2021-12-18 DIAGNOSIS — R41841 Cognitive communication deficit: Secondary | ICD-10-CM | POA: Diagnosis not present

## 2021-12-18 DIAGNOSIS — E7800 Pure hypercholesterolemia, unspecified: Secondary | ICD-10-CM | POA: Diagnosis not present

## 2021-12-18 DIAGNOSIS — M6281 Muscle weakness (generalized): Secondary | ICD-10-CM | POA: Diagnosis not present

## 2021-12-18 DIAGNOSIS — Z89612 Acquired absence of left leg above knee: Secondary | ICD-10-CM | POA: Diagnosis not present

## 2021-12-18 DIAGNOSIS — Z89511 Acquired absence of right leg below knee: Secondary | ICD-10-CM | POA: Diagnosis not present

## 2021-12-18 DIAGNOSIS — Z993 Dependence on wheelchair: Secondary | ICD-10-CM | POA: Diagnosis not present

## 2021-12-18 DIAGNOSIS — Z7409 Other reduced mobility: Secondary | ICD-10-CM | POA: Diagnosis not present

## 2021-12-18 DIAGNOSIS — R7401 Elevation of levels of liver transaminase levels: Secondary | ICD-10-CM | POA: Diagnosis not present

## 2022-01-01 DIAGNOSIS — R41841 Cognitive communication deficit: Secondary | ICD-10-CM | POA: Diagnosis not present

## 2022-01-01 DIAGNOSIS — M6281 Muscle weakness (generalized): Secondary | ICD-10-CM | POA: Diagnosis not present

## 2022-01-01 DIAGNOSIS — R7401 Elevation of levels of liver transaminase levels: Secondary | ICD-10-CM | POA: Diagnosis not present

## 2022-01-01 DIAGNOSIS — E7800 Pure hypercholesterolemia, unspecified: Secondary | ICD-10-CM | POA: Diagnosis not present

## 2022-01-01 DIAGNOSIS — Z993 Dependence on wheelchair: Secondary | ICD-10-CM | POA: Diagnosis not present

## 2022-01-01 DIAGNOSIS — Z89612 Acquired absence of left leg above knee: Secondary | ICD-10-CM | POA: Diagnosis not present

## 2022-01-01 DIAGNOSIS — Z7409 Other reduced mobility: Secondary | ICD-10-CM | POA: Diagnosis not present

## 2022-01-01 DIAGNOSIS — Z89511 Acquired absence of right leg below knee: Secondary | ICD-10-CM | POA: Diagnosis not present

## 2022-01-01 DIAGNOSIS — E1159 Type 2 diabetes mellitus with other circulatory complications: Secondary | ICD-10-CM | POA: Diagnosis not present

## 2022-01-02 DIAGNOSIS — M6281 Muscle weakness (generalized): Secondary | ICD-10-CM | POA: Diagnosis not present

## 2022-01-02 DIAGNOSIS — Z89511 Acquired absence of right leg below knee: Secondary | ICD-10-CM | POA: Diagnosis not present

## 2022-01-02 DIAGNOSIS — R7401 Elevation of levels of liver transaminase levels: Secondary | ICD-10-CM | POA: Diagnosis not present

## 2022-01-02 DIAGNOSIS — Z993 Dependence on wheelchair: Secondary | ICD-10-CM | POA: Diagnosis not present

## 2022-01-02 DIAGNOSIS — Z89612 Acquired absence of left leg above knee: Secondary | ICD-10-CM | POA: Diagnosis not present

## 2022-01-02 DIAGNOSIS — E7800 Pure hypercholesterolemia, unspecified: Secondary | ICD-10-CM | POA: Diagnosis not present

## 2022-01-02 DIAGNOSIS — Z7409 Other reduced mobility: Secondary | ICD-10-CM | POA: Diagnosis not present

## 2022-01-02 DIAGNOSIS — E1159 Type 2 diabetes mellitus with other circulatory complications: Secondary | ICD-10-CM | POA: Diagnosis not present

## 2022-01-02 DIAGNOSIS — R41841 Cognitive communication deficit: Secondary | ICD-10-CM | POA: Diagnosis not present

## 2022-01-03 DIAGNOSIS — E7800 Pure hypercholesterolemia, unspecified: Secondary | ICD-10-CM | POA: Diagnosis not present

## 2022-01-03 DIAGNOSIS — Z89612 Acquired absence of left leg above knee: Secondary | ICD-10-CM | POA: Diagnosis not present

## 2022-01-03 DIAGNOSIS — Z993 Dependence on wheelchair: Secondary | ICD-10-CM | POA: Diagnosis not present

## 2022-01-03 DIAGNOSIS — R7401 Elevation of levels of liver transaminase levels: Secondary | ICD-10-CM | POA: Diagnosis not present

## 2022-01-03 DIAGNOSIS — E1159 Type 2 diabetes mellitus with other circulatory complications: Secondary | ICD-10-CM | POA: Diagnosis not present

## 2022-01-03 DIAGNOSIS — R41841 Cognitive communication deficit: Secondary | ICD-10-CM | POA: Diagnosis not present

## 2022-01-03 DIAGNOSIS — M6281 Muscle weakness (generalized): Secondary | ICD-10-CM | POA: Diagnosis not present

## 2022-01-03 DIAGNOSIS — Z89511 Acquired absence of right leg below knee: Secondary | ICD-10-CM | POA: Diagnosis not present

## 2022-01-03 DIAGNOSIS — Z7409 Other reduced mobility: Secondary | ICD-10-CM | POA: Diagnosis not present

## 2022-01-05 DIAGNOSIS — R369 Urethral discharge, unspecified: Secondary | ICD-10-CM | POA: Diagnosis not present

## 2022-01-06 DIAGNOSIS — Z993 Dependence on wheelchair: Secondary | ICD-10-CM | POA: Diagnosis not present

## 2022-01-06 DIAGNOSIS — Z7409 Other reduced mobility: Secondary | ICD-10-CM | POA: Diagnosis not present

## 2022-01-06 DIAGNOSIS — E1159 Type 2 diabetes mellitus with other circulatory complications: Secondary | ICD-10-CM | POA: Diagnosis not present

## 2022-01-06 DIAGNOSIS — M6281 Muscle weakness (generalized): Secondary | ICD-10-CM | POA: Diagnosis not present

## 2022-01-06 DIAGNOSIS — Z89511 Acquired absence of right leg below knee: Secondary | ICD-10-CM | POA: Diagnosis not present

## 2022-01-06 DIAGNOSIS — E7800 Pure hypercholesterolemia, unspecified: Secondary | ICD-10-CM | POA: Diagnosis not present

## 2022-01-06 DIAGNOSIS — N481 Balanitis: Secondary | ICD-10-CM | POA: Diagnosis not present

## 2022-01-06 DIAGNOSIS — R7401 Elevation of levels of liver transaminase levels: Secondary | ICD-10-CM | POA: Diagnosis not present

## 2022-01-06 DIAGNOSIS — Z89612 Acquired absence of left leg above knee: Secondary | ICD-10-CM | POA: Diagnosis not present

## 2022-01-06 DIAGNOSIS — R41841 Cognitive communication deficit: Secondary | ICD-10-CM | POA: Diagnosis not present

## 2022-01-07 DIAGNOSIS — Z89511 Acquired absence of right leg below knee: Secondary | ICD-10-CM | POA: Diagnosis not present

## 2022-01-07 DIAGNOSIS — E1159 Type 2 diabetes mellitus with other circulatory complications: Secondary | ICD-10-CM | POA: Diagnosis not present

## 2022-01-07 DIAGNOSIS — Z89612 Acquired absence of left leg above knee: Secondary | ICD-10-CM | POA: Diagnosis not present

## 2022-01-07 DIAGNOSIS — Z7409 Other reduced mobility: Secondary | ICD-10-CM | POA: Diagnosis not present

## 2022-01-07 DIAGNOSIS — Z993 Dependence on wheelchair: Secondary | ICD-10-CM | POA: Diagnosis not present

## 2022-01-07 DIAGNOSIS — M6281 Muscle weakness (generalized): Secondary | ICD-10-CM | POA: Diagnosis not present

## 2022-01-07 DIAGNOSIS — E7800 Pure hypercholesterolemia, unspecified: Secondary | ICD-10-CM | POA: Diagnosis not present

## 2022-01-07 DIAGNOSIS — R41841 Cognitive communication deficit: Secondary | ICD-10-CM | POA: Diagnosis not present

## 2022-01-07 DIAGNOSIS — R7401 Elevation of levels of liver transaminase levels: Secondary | ICD-10-CM | POA: Diagnosis not present

## 2022-01-08 DIAGNOSIS — Z993 Dependence on wheelchair: Secondary | ICD-10-CM | POA: Diagnosis not present

## 2022-01-08 DIAGNOSIS — Z89612 Acquired absence of left leg above knee: Secondary | ICD-10-CM | POA: Diagnosis not present

## 2022-01-08 DIAGNOSIS — Z89511 Acquired absence of right leg below knee: Secondary | ICD-10-CM | POA: Diagnosis not present

## 2022-01-08 DIAGNOSIS — R7401 Elevation of levels of liver transaminase levels: Secondary | ICD-10-CM | POA: Diagnosis not present

## 2022-01-08 DIAGNOSIS — R41841 Cognitive communication deficit: Secondary | ICD-10-CM | POA: Diagnosis not present

## 2022-01-08 DIAGNOSIS — M6281 Muscle weakness (generalized): Secondary | ICD-10-CM | POA: Diagnosis not present

## 2022-01-08 DIAGNOSIS — E7800 Pure hypercholesterolemia, unspecified: Secondary | ICD-10-CM | POA: Diagnosis not present

## 2022-01-08 DIAGNOSIS — Z7409 Other reduced mobility: Secondary | ICD-10-CM | POA: Diagnosis not present

## 2022-01-08 DIAGNOSIS — E1159 Type 2 diabetes mellitus with other circulatory complications: Secondary | ICD-10-CM | POA: Diagnosis not present

## 2022-01-09 DIAGNOSIS — E1159 Type 2 diabetes mellitus with other circulatory complications: Secondary | ICD-10-CM | POA: Diagnosis not present

## 2022-01-09 DIAGNOSIS — M6281 Muscle weakness (generalized): Secondary | ICD-10-CM | POA: Diagnosis not present

## 2022-01-09 DIAGNOSIS — E1151 Type 2 diabetes mellitus with diabetic peripheral angiopathy without gangrene: Secondary | ICD-10-CM | POA: Diagnosis not present

## 2022-01-09 DIAGNOSIS — I509 Heart failure, unspecified: Secondary | ICD-10-CM | POA: Diagnosis not present

## 2022-01-09 DIAGNOSIS — E7800 Pure hypercholesterolemia, unspecified: Secondary | ICD-10-CM | POA: Diagnosis not present

## 2022-01-09 DIAGNOSIS — R41841 Cognitive communication deficit: Secondary | ICD-10-CM | POA: Diagnosis not present

## 2022-01-09 DIAGNOSIS — N481 Balanitis: Secondary | ICD-10-CM | POA: Diagnosis not present

## 2022-01-09 DIAGNOSIS — Z7409 Other reduced mobility: Secondary | ICD-10-CM | POA: Diagnosis not present

## 2022-01-09 DIAGNOSIS — R7401 Elevation of levels of liver transaminase levels: Secondary | ICD-10-CM | POA: Diagnosis not present

## 2022-01-09 DIAGNOSIS — Z89511 Acquired absence of right leg below knee: Secondary | ICD-10-CM | POA: Diagnosis not present

## 2022-01-09 DIAGNOSIS — Z89612 Acquired absence of left leg above knee: Secondary | ICD-10-CM | POA: Diagnosis not present

## 2022-01-09 DIAGNOSIS — Z993 Dependence on wheelchair: Secondary | ICD-10-CM | POA: Diagnosis not present

## 2022-01-10 DIAGNOSIS — Z89511 Acquired absence of right leg below knee: Secondary | ICD-10-CM | POA: Diagnosis not present

## 2022-01-10 DIAGNOSIS — Z7409 Other reduced mobility: Secondary | ICD-10-CM | POA: Diagnosis not present

## 2022-01-10 DIAGNOSIS — E7800 Pure hypercholesterolemia, unspecified: Secondary | ICD-10-CM | POA: Diagnosis not present

## 2022-01-10 DIAGNOSIS — R41841 Cognitive communication deficit: Secondary | ICD-10-CM | POA: Diagnosis not present

## 2022-01-10 DIAGNOSIS — Z993 Dependence on wheelchair: Secondary | ICD-10-CM | POA: Diagnosis not present

## 2022-01-10 DIAGNOSIS — R7401 Elevation of levels of liver transaminase levels: Secondary | ICD-10-CM | POA: Diagnosis not present

## 2022-01-10 DIAGNOSIS — M6281 Muscle weakness (generalized): Secondary | ICD-10-CM | POA: Diagnosis not present

## 2022-01-10 DIAGNOSIS — E1159 Type 2 diabetes mellitus with other circulatory complications: Secondary | ICD-10-CM | POA: Diagnosis not present

## 2022-01-10 DIAGNOSIS — Z89612 Acquired absence of left leg above knee: Secondary | ICD-10-CM | POA: Diagnosis not present

## 2022-01-13 DIAGNOSIS — Z89511 Acquired absence of right leg below knee: Secondary | ICD-10-CM | POA: Diagnosis not present

## 2022-01-13 DIAGNOSIS — R41841 Cognitive communication deficit: Secondary | ICD-10-CM | POA: Diagnosis not present

## 2022-01-13 DIAGNOSIS — M6281 Muscle weakness (generalized): Secondary | ICD-10-CM | POA: Diagnosis not present

## 2022-01-13 DIAGNOSIS — Z993 Dependence on wheelchair: Secondary | ICD-10-CM | POA: Diagnosis not present

## 2022-01-13 DIAGNOSIS — Z89612 Acquired absence of left leg above knee: Secondary | ICD-10-CM | POA: Diagnosis not present

## 2022-01-13 DIAGNOSIS — R7401 Elevation of levels of liver transaminase levels: Secondary | ICD-10-CM | POA: Diagnosis not present

## 2022-01-13 DIAGNOSIS — Z7409 Other reduced mobility: Secondary | ICD-10-CM | POA: Diagnosis not present

## 2022-01-13 DIAGNOSIS — E1159 Type 2 diabetes mellitus with other circulatory complications: Secondary | ICD-10-CM | POA: Diagnosis not present

## 2022-01-13 DIAGNOSIS — E7800 Pure hypercholesterolemia, unspecified: Secondary | ICD-10-CM | POA: Diagnosis not present

## 2022-01-14 DIAGNOSIS — Z7409 Other reduced mobility: Secondary | ICD-10-CM | POA: Diagnosis not present

## 2022-01-14 DIAGNOSIS — M6281 Muscle weakness (generalized): Secondary | ICD-10-CM | POA: Diagnosis not present

## 2022-01-14 DIAGNOSIS — E7800 Pure hypercholesterolemia, unspecified: Secondary | ICD-10-CM | POA: Diagnosis not present

## 2022-01-14 DIAGNOSIS — Z89511 Acquired absence of right leg below knee: Secondary | ICD-10-CM | POA: Diagnosis not present

## 2022-01-14 DIAGNOSIS — R7401 Elevation of levels of liver transaminase levels: Secondary | ICD-10-CM | POA: Diagnosis not present

## 2022-01-14 DIAGNOSIS — R41841 Cognitive communication deficit: Secondary | ICD-10-CM | POA: Diagnosis not present

## 2022-01-14 DIAGNOSIS — N481 Balanitis: Secondary | ICD-10-CM | POA: Diagnosis not present

## 2022-01-14 DIAGNOSIS — Z89612 Acquired absence of left leg above knee: Secondary | ICD-10-CM | POA: Diagnosis not present

## 2022-01-14 DIAGNOSIS — Z993 Dependence on wheelchair: Secondary | ICD-10-CM | POA: Diagnosis not present

## 2022-01-14 DIAGNOSIS — E1159 Type 2 diabetes mellitus with other circulatory complications: Secondary | ICD-10-CM | POA: Diagnosis not present
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
